# Patient Record
Sex: Female | Born: 1935 | Race: Black or African American | Hispanic: No | Smoking: Former smoker
Health system: Southern US, Community
[De-identification: ages and names within clinical notes are randomized; demographics above are authoritative.]

## PROBLEM LIST (undated history)

## (undated) DIAGNOSIS — F329 Major depressive disorder, single episode, unspecified: Secondary | ICD-10-CM

## (undated) DIAGNOSIS — J449 Chronic obstructive pulmonary disease, unspecified: Secondary | ICD-10-CM

## (undated) DIAGNOSIS — C189 Malignant neoplasm of colon, unspecified: Secondary | ICD-10-CM

## (undated) DIAGNOSIS — D126 Benign neoplasm of colon, unspecified: Secondary | ICD-10-CM

## (undated) DIAGNOSIS — H9193 Unspecified hearing loss, bilateral: Principal | ICD-10-CM

## (undated) DIAGNOSIS — Z8742 Personal history of other diseases of the female genital tract: Secondary | ICD-10-CM

## (undated) DIAGNOSIS — G609 Hereditary and idiopathic neuropathy, unspecified: Secondary | ICD-10-CM

## (undated) DIAGNOSIS — N23 Unspecified renal colic: Secondary | ICD-10-CM

## (undated) DIAGNOSIS — G8194 Hemiplegia, unspecified affecting left nondominant side: Secondary | ICD-10-CM

## (undated) DIAGNOSIS — F32A Depression, unspecified: Secondary | ICD-10-CM

## (undated) DIAGNOSIS — E119 Type 2 diabetes mellitus without complications: Secondary | ICD-10-CM

## (undated) DIAGNOSIS — Z860100 Personal history of colon polyps, unspecified: Secondary | ICD-10-CM

## (undated) DIAGNOSIS — M7918 Myalgia, other site: Secondary | ICD-10-CM

## (undated) DIAGNOSIS — D649 Anemia, unspecified: Secondary | ICD-10-CM

## (undated) DIAGNOSIS — D500 Iron deficiency anemia secondary to blood loss (chronic): Principal | ICD-10-CM

## (undated) DIAGNOSIS — K5790 Diverticulosis of intestine, part unspecified, without perforation or abscess without bleeding: Secondary | ICD-10-CM

## (undated) DIAGNOSIS — I10 Essential (primary) hypertension: Secondary | ICD-10-CM

## (undated) DIAGNOSIS — E7800 Pure hypercholesterolemia, unspecified: Secondary | ICD-10-CM

## (undated) DIAGNOSIS — C50919 Malignant neoplasm of unspecified site of unspecified female breast: Secondary | ICD-10-CM

## (undated) DIAGNOSIS — E039 Hypothyroidism, unspecified: Secondary | ICD-10-CM

## (undated) DIAGNOSIS — J4489 Other specified chronic obstructive pulmonary disease: Secondary | ICD-10-CM

## (undated) DIAGNOSIS — M6281 Muscle weakness (generalized): Secondary | ICD-10-CM

## (undated) HISTORY — DX: Major depressive disorder, single episode, unspecified: F32.9

## (undated) HISTORY — PX: MASTECTOMY: SHX3

## (undated) HISTORY — DX: Iron deficiency anemia secondary to blood loss (chronic): D50.0

## (undated) HISTORY — DX: Hemiplegia, unspecified affecting left nondominant side: G81.94

## (undated) HISTORY — DX: Type 2 diabetes mellitus without complications: E11.9

## (undated) HISTORY — DX: Myalgia, other site: M79.18

## (undated) HISTORY — DX: Diverticulosis of intestine, part unspecified, without perforation or abscess without bleeding: K57.90

## (undated) HISTORY — DX: Other specified chronic obstructive pulmonary disease: J44.89

## (undated) HISTORY — DX: Personal history of other diseases of the female genital tract: Z87.42

## (undated) HISTORY — DX: Depression, unspecified: F32.A

## (undated) HISTORY — DX: Hypothyroidism, unspecified: E03.9

## (undated) HISTORY — DX: Chronic obstructive pulmonary disease, unspecified: J44.9

## (undated) HISTORY — DX: Personal history of colon polyps, unspecified: Z86.0100

## (undated) HISTORY — DX: Muscle weakness (generalized): M62.81

## (undated) HISTORY — DX: Malignant neoplasm of unspecified site of unspecified female breast: C50.919

## (undated) HISTORY — DX: Anemia, unspecified: D64.9

## (undated) HISTORY — PX: TONSILLECTOMY: SUR1361

## (undated) HISTORY — DX: Personal history of colonic polyps: Z86.010

## (undated) HISTORY — DX: Unspecified renal colic: N23

## (undated) HISTORY — PX: PARTIAL COLECTOMY: SHX5273

## (undated) HISTORY — DX: Benign neoplasm of colon, unspecified: D12.6

## (undated) HISTORY — DX: Unspecified hearing loss, bilateral: H91.93

## (undated) HISTORY — PX: COLON SURGERY: SHX602

## (undated) HISTORY — PX: HERNIA REPAIR: SHX51

## (undated) HISTORY — DX: Hereditary and idiopathic neuropathy, unspecified: G60.9

---

## 1997-06-30 DIAGNOSIS — C189 Malignant neoplasm of colon, unspecified: Secondary | ICD-10-CM

## 1997-06-30 HISTORY — DX: Malignant neoplasm of colon, unspecified: C18.9

## 1999-07-01 DIAGNOSIS — C50919 Malignant neoplasm of unspecified site of unspecified female breast: Secondary | ICD-10-CM

## 1999-07-01 HISTORY — DX: Malignant neoplasm of unspecified site of unspecified female breast: C50.919

## 2005-05-02 ENCOUNTER — Ambulatory Visit (HOSPITAL_COMMUNITY): Admission: RE | Admit: 2005-05-02 | Discharge: 2005-05-02 | Payer: Self-pay | Admitting: Gastroenterology

## 2007-09-29 ENCOUNTER — Encounter: Payer: Self-pay | Admitting: Endocrinology

## 2007-11-17 ENCOUNTER — Encounter: Payer: Self-pay | Admitting: Endocrinology

## 2007-12-09 DIAGNOSIS — F329 Major depressive disorder, single episode, unspecified: Secondary | ICD-10-CM

## 2007-12-09 DIAGNOSIS — J069 Acute upper respiratory infection, unspecified: Secondary | ICD-10-CM | POA: Insufficient documentation

## 2007-12-09 DIAGNOSIS — M549 Dorsalgia, unspecified: Secondary | ICD-10-CM | POA: Insufficient documentation

## 2007-12-09 DIAGNOSIS — E119 Type 2 diabetes mellitus without complications: Secondary | ICD-10-CM

## 2007-12-09 DIAGNOSIS — Z85038 Personal history of other malignant neoplasm of large intestine: Secondary | ICD-10-CM

## 2007-12-09 DIAGNOSIS — E785 Hyperlipidemia, unspecified: Secondary | ICD-10-CM

## 2007-12-09 DIAGNOSIS — R002 Palpitations: Secondary | ICD-10-CM

## 2007-12-09 DIAGNOSIS — G609 Hereditary and idiopathic neuropathy, unspecified: Secondary | ICD-10-CM | POA: Insufficient documentation

## 2007-12-09 DIAGNOSIS — Z853 Personal history of malignant neoplasm of breast: Secondary | ICD-10-CM | POA: Insufficient documentation

## 2007-12-09 DIAGNOSIS — F17200 Nicotine dependence, unspecified, uncomplicated: Secondary | ICD-10-CM

## 2007-12-10 ENCOUNTER — Ambulatory Visit: Payer: Self-pay | Admitting: Endocrinology

## 2007-12-10 DIAGNOSIS — Z85038 Personal history of other malignant neoplasm of large intestine: Secondary | ICD-10-CM | POA: Insufficient documentation

## 2007-12-10 DIAGNOSIS — Z853 Personal history of malignant neoplasm of breast: Secondary | ICD-10-CM | POA: Insufficient documentation

## 2008-03-09 ENCOUNTER — Ambulatory Visit: Payer: Self-pay | Admitting: Endocrinology

## 2008-06-09 ENCOUNTER — Ambulatory Visit (HOSPITAL_COMMUNITY): Admission: RE | Admit: 2008-06-09 | Discharge: 2008-06-09 | Payer: Self-pay | Admitting: Gastroenterology

## 2008-06-09 ENCOUNTER — Encounter (INDEPENDENT_AMBULATORY_CARE_PROVIDER_SITE_OTHER): Payer: Self-pay | Admitting: Gastroenterology

## 2008-11-03 ENCOUNTER — Encounter: Admission: RE | Admit: 2008-11-03 | Discharge: 2008-11-03 | Payer: Self-pay | Admitting: Geriatric Medicine

## 2008-11-13 ENCOUNTER — Ambulatory Visit: Payer: Self-pay | Admitting: Endocrinology

## 2008-11-13 DIAGNOSIS — E890 Postprocedural hypothyroidism: Secondary | ICD-10-CM

## 2008-11-13 LAB — CONVERTED CEMR LAB: TSH: 13.24 microintl units/mL — ABNORMAL HIGH (ref 0.35–5.50)

## 2008-11-15 ENCOUNTER — Telehealth (INDEPENDENT_AMBULATORY_CARE_PROVIDER_SITE_OTHER): Payer: Self-pay | Admitting: *Deleted

## 2008-12-25 ENCOUNTER — Ambulatory Visit: Payer: Self-pay | Admitting: Endocrinology

## 2009-02-09 ENCOUNTER — Ambulatory Visit: Payer: Self-pay | Admitting: Vascular Surgery

## 2009-05-01 ENCOUNTER — Telehealth (INDEPENDENT_AMBULATORY_CARE_PROVIDER_SITE_OTHER): Payer: Self-pay | Admitting: *Deleted

## 2009-05-03 ENCOUNTER — Ambulatory Visit: Payer: Self-pay | Admitting: Endocrinology

## 2009-07-30 ENCOUNTER — Telehealth: Payer: Self-pay | Admitting: Endocrinology

## 2009-09-05 ENCOUNTER — Ambulatory Visit: Payer: Self-pay | Admitting: Endocrinology

## 2009-09-06 LAB — CONVERTED CEMR LAB: TSH: 1.92 microintl units/mL (ref 0.35–5.50)

## 2010-02-20 ENCOUNTER — Telehealth: Payer: Self-pay | Admitting: Endocrinology

## 2010-02-27 ENCOUNTER — Ambulatory Visit: Payer: Self-pay | Admitting: Endocrinology

## 2010-02-27 LAB — CONVERTED CEMR LAB: TSH: 3.56 microintl units/mL (ref 0.35–5.50)

## 2010-03-12 ENCOUNTER — Ambulatory Visit: Payer: Self-pay | Admitting: Gynecology

## 2010-03-13 ENCOUNTER — Ambulatory Visit: Payer: Self-pay | Admitting: Gynecology

## 2010-05-13 ENCOUNTER — Other Ambulatory Visit: Admission: RE | Admit: 2010-05-13 | Discharge: 2010-05-13 | Payer: Self-pay | Admitting: Gynecology

## 2010-05-13 ENCOUNTER — Ambulatory Visit: Payer: Self-pay | Admitting: Gynecology

## 2010-06-21 ENCOUNTER — Ambulatory Visit: Payer: Self-pay | Admitting: Gynecology

## 2010-07-30 NOTE — Progress Notes (Signed)
Summary: levothyroxine  Phone Note Refill Request Message from:  Fax from Pharmacy on February 20, 2010 10:16 AM  Refills Requested: Medication #1:  LEVOTHYROXINE SODIUM 50 MCG TABS 1 qd   Dosage confirmed as above?Dosage Confirmed   Last Refilled: 07/30/2009  Method Requested: Fax to Mail Away Pharmacy Initial call taken by: Brenton Grills MA,  February 20, 2010 10:16 AM    Prescriptions: LEVOTHYROXINE SODIUM 50 MCG TABS (LEVOTHYROXINE SODIUM) 1 qd  #90 x 1   Entered by:   Brenton Grills MA   Authorized by:   Minus Breeding MD   Signed by:   Brenton Grills MA on 02/20/2010   Method used:   Faxed to ...       [facility]* (mail-order)       [address], [address] [place]  [address]       Ph: [phone]       Fax: [phone]   RxID:   [id number]

## 2010-07-30 NOTE — Progress Notes (Signed)
  Phone Note Refill Request Message from:  Fax from Pharmacy on July 30, 2009 4:17 PM  Refills Requested: Medication #1:  LEVOTHYROXINE SODIUM 50 MCG TABS 1 qd.   Dosage confirmed as above?Dosage Confirmed Initial call taken by: Josph Macho CMA,  July 30, 2009 4:18 PM    Prescriptions: LEVOTHYROXINE SODIUM 50 MCG TABS (LEVOTHYROXINE SODIUM) 1 qd  #90 x 1   Entered by:   Josph Macho CMA   Authorized by:   Minus Breeding MD   Signed by:   Josph Macho CMA on 07/30/2009   Method used:   Electronically to        [facility]* (mail-order)       [address], [address] [place]  [address]       Ph: [phone]       Fax: [phone]   RxID:   [id number]

## 2010-07-30 NOTE — Assessment & Plan Note (Signed)
Summary: FU--STC   Vital Signs:  Patient profile:   76 year old female Height:      64 inches (162.56 cm) Weight:      99.50 pounds (45.23 kg) O2 Sat:      97 % on Room air Temp:     96.6 degrees F (35.89 degrees C) oral Pulse rate:   80 / minute BP sitting:   104 / 60  (left arm) Cuff size:   regular  Vitals Entered By: Josph Macho RMA (September 05, 2009 10:48 AM)  O2 Flow:  Room air CC: Follow-up visit/ CF Is Patient Diabetic? Yes   Referring Provider:  [name] Primary Provider:  [name]  CC:  Follow-up visit/ CF.  History of Present Illness: pt has h/o hypothyroidism since i-131 rx in approx. 1992.  she takes synthroid as rx'ed. pt says she feels well except for fatigue, weight loss, and diffuse hair loss.  Current Medications (verified): 1)  Alprazolam 0.5 Mg  Tbdp (Alprazolam) .... Take 1 By Mouth Three Times A Day 2)  Atrovent Hfa 17 Mcg/act  Aers (Ipratropium Bromide Hfa) .... Inhale 2 Puffs Qid 3)  Glucophage 500 Mg  Tabs (Metformin Hcl) .... Take 1 By Mouth Two Times A Day Qd 4)  Mucinex 600 Mg  Tb12 (Guaifenesin) .... Take 1 By Mouth Qd 5)  Nasal Saline 0.65 %  Soln (Saline) .Marland Kitchen.. 1 Spray Each Nostril Once Daily Prn 6)  Rhinocort Aqua 32 Mcg/act  Susp (Budesonide) .Marland Kitchen.. 1 Spray Each Nostril Two Times A Day 7)  Simvastatin 10 Mg  Tabs (Simvastatin) .... Take 1 By Mouth Qd 8)  Lidoderm 5 %  Ptch (Lidocaine) .... Use As Directed 9)  Lisinopril 10 Mg Tabs (Lisinopril) .Marland Kitchen.. 1 Daily 10)  Lyrica 50 Mg Caps (Pregabalin) .Marland Kitchen.. 1 Tab Three Times A Day 11)  Vitamin B 12 .Marland KitchenMarland Kitchen. 1 Daily 12)  Levothyroxine Sodium 50 Mcg Tabs (Levothyroxine Sodium) .Marland Kitchen.. 1 Qd 13)  Goody .... As Needed 14)  Bengay Pain Relief + Massage 2.5 % Gel (Menthol (Topical Analgesic)) .... As Needed  Allergies (verified): No Known Drug Allergies  Past History:  Past Medical History: Last updated: 12/10/2007 Depression Diabetes mellitus, type II Hyperlipidemia Hypothyroidism Breast cancer,  hx of Colon cancer, hx of  Review of Systems  The patient denies weight loss and weight gain.    Physical Exam  General:  underweight.   Neck:  thyroid is not enlarged. Additional Exam:  FastTSH                   1.92 uIU/mL     Impression & Recommendations:  Problem # 1:  HYPOTHYROIDISM, POST-RADIATION (ICD-244.1) well-replaced  Medications Added to Medication List This Visit: 1)  Goody  .... As needed 2)  Bengay Pain Relief + Massage 2.5 % Gel (Menthol (topical analgesic)) .... As needed  Other Orders: TLB-TSH (Thyroid Stimulating Hormone) (84443-TSH) Est. Patient Level III (50093)  Patient Instructions: 1)  we discussed the fact that you are replaced by less than a full average dosage of synthroid. therefore, you should conclude that your thyroid was not completely destroyed by the radioactive iodine, and that you are at risk for recurrence of the overactive thyroid. 2)  return 6 mos 3)  pending the test results, please continue the same synthroid (50/day) 4)  tests are being ordered for you today.  a few days after the test(s), please call [phone] to hear your test results.  5)  (update: i  left message on phone-tree:  rx as we discussed)

## 2010-07-30 NOTE — Assessment & Plan Note (Signed)
Summary: FU--STC   Vital Signs:  Patient profile:   76 year old female Height:      64 inches (162.56 cm) Weight:      101 pounds (45.91 kg) BMI:     17.40 O2 Sat:      98 % on Room air Temp:     97.7 degrees F (36.50 degrees C) oral Pulse rate:   59 / minute BP sitting:   118 / 68  (left arm) Cuff size:   regular  Vitals Entered By: Brenton Grills MA (February 27, 2010 10:09 AM)  O2 Flow:  Room air CC: F/U visit/pt is no longer taking Lyrica/aj Is Patient Diabetic? Yes   Referring Provider:  [name] Primary Provider:  [name]  CC:  F/U visit/pt is no longer taking Lyrica/aj.  History of Present Illness: pt has h/o hypothyroidism since i-131 rx in approx. 1992.  she takes synthroid as rx'ed. pt says she feels well except for fatigue and cold intolerance.    Current Medications (verified): 1)  Alprazolam 0.5 Mg  Tbdp (Alprazolam) .... Take 1 By Mouth Three Times A Day 2)  Atrovent Hfa 17 Mcg/act  Aers (Ipratropium Bromide Hfa) .... Inhale 2 Puffs Qid 3)  Glucophage 500 Mg  Tabs (Metformin Hcl) .... Take 1 By Mouth Two Times A Day Qd 4)  Mucinex 600 Mg  Tb12 (Guaifenesin) .... Take 1 By Mouth Qd 5)  Nasal Saline 0.65 %  Soln (Saline) .Marland Kitchen.. 1 Spray Each Nostril Once Daily Prn 6)  Rhinocort Aqua 32 Mcg/act  Susp (Budesonide) .Marland Kitchen.. 1 Spray Each Nostril Two Times A Day 7)  Simvastatin 10 Mg  Tabs (Simvastatin) .... Take 1 By Mouth Qd 8)  Lidoderm 5 %  Ptch (Lidocaine) .... Use As Directed 9)  Lisinopril 10 Mg Tabs (Lisinopril) .Marland Kitchen.. 1 Daily 10)  Lyrica 50 Mg Caps (Pregabalin) .Marland Kitchen.. 1 Tab Three Times A Day 11)  Vitamin B 12 .Marland KitchenMarland Kitchen. 1 Daily 12)  Levothyroxine Sodium 50 Mcg Tabs (Levothyroxine Sodium) .Marland Kitchen.. 1 Qd 13)  Goody .... As Needed 14)  Bengay Pain Relief + Massage 2.5 % Gel (Menthol (Topical Analgesic)) .... As Needed 15)  Gabapentin 100 Mg Caps (Gabapentin) .Marland Kitchen.. 1 Tablet By Mouth Three Times A Day  Allergies (verified): No Known Drug Allergies  Past History:  Past  Medical History: Last updated: 12/10/2007 Depression Diabetes mellitus, type II Hyperlipidemia Hypothyroidism Breast cancer, hx of Colon cancer, hx of  Review of Systems       she has lost a few lbs.  Physical Exam  General:  normal appearance.   Neck:  thyroid is not enlarged. Additional Exam:  FastTSH                   3.56 uIU/mL     Impression & Recommendations:  Problem # 1:  HYPOTHYROIDISM, POST-RADIATION (ICD-244.1) well-replaced  Medications Added to Medication List This Visit: 1)  Gabapentin 100 Mg Caps (Gabapentin) .Marland Kitchen.. 1 tablet by mouth three times a day  Other Orders: TLB-TSH (Thyroid Stimulating Hormone) (84443-TSH) Est. Patient Level III (47829)  Patient Instructions: 1)  blood tests are being ordered for you today.  please call [phone] to hear your test results. 2)  pending the test results, please continue the same medications for now 3)  Please schedule a follow-up appointment in 6 months. 4)  (update: i left message on phone-tree:  rx as we discussed)

## 2010-11-12 NOTE — Op Note (Signed)
NAME:  Laura [name], Laura [name]                 ACCOUNT NO.:  [id number]   MEDICAL RECORD NO.:  [id number]          PATIENT TYPE:  AMB   LOCATION:  ENDO                         FACILITY:  [facility]   PHYSICIAN:  James L. Malon Kindle., M.D.DATE OF BIRTH:  [birth year]   DATE OF PROCEDURE:  DATE OF DISCHARGE:                               OPERATIVE REPORT   SURGEON:  James L. Randa Evens, MD   PROCEDURES:  1. Colonoscopy.  2. Polypectomy.   MEDICATIONS:  1. Fentanyl 75 mcg.  2. Versed 7.5 mg IV.   INDICATION:  Followup for previous colon cancer done at another  location.   SCOPE:  Pentax pediatric colonoscope.   DESCRIPTION OF PROCEDURE:  The patient was sedated and the Pentax scope  was inserted and advanced.  Prep was quite good.  The scar was seen in  the sigmoid colon from previous surgery.  We passed this and were able  to advance easily to the cecum, ileocecal valve, and appendiceal orifice  seen.  Scope withdrawn.  Cecum, ascending colon, transverse colon, and  descending colon were seen well.  Approximately 25 cm from anal verge  just distal to the surgical anastomosis, a 0.5- to 0.75-cm pedunculated  polyp was encountered and removed with a snare and sucked through the  scope.  A second 0.5-cm sessile polyp was found in the mid rectum and  removed with a snare and sucked through the scope.  The scope was  withdrawn.  The patient tolerated the procedure well.  The polyps were  recovered.   ASSESSMENT:  1. Polyps removed from the rectosigmoid and rectum.  2. Previous history of colon cancer with previous resection of the      left colon.   PLAN:  Routine post-polypectomy instructions.  We will recommend  repeating procedure in 3 years.           ______________________________  Llana Aliment Malon Kindle., M.D.     Waldron Session  D:  06/09/2008  T:  06/10/2008  Job:  [id number]   cc:   Dr. [name]

## 2010-11-12 NOTE — Consult Note (Signed)
NEW PATIENT CONSULTATION   Laura [name], Laura [name]  DOB:  1935/03/08                                       02/09/2009  CHART#:[id number]   The patient presents today for evaluation of lower extremity pain.  She  reports that this has been present for a number of years and initially  was in her feet but now is from her knees distally.  She reports this is  present 24 hours a day 7 days a week and is becoming more incapacitating  to her.  This is not related to walking or other positioning.  She does  not have any history of tissue loss.   PAST MEDICAL HISTORY:  Is significant for non-insulin diabetes for  greater than 10 years.  She does have history of hypertension and  elevated cholesterol, history of breast cancer and colon cancer.   FAMILY HISTORY:  Negative for premature atherosclerotic disease.   SOCIAL HISTORY:  She is widowed with four children.  She is retired.  She does smoke half pack of cigarettes per day.  She does not drink  alcohol on a regular basis.   REVIEW OF SYSTEMS:  Positive for weight loss, loss of appetite, weight  at 105 pounds.  She is 5 feet tall.  Pulmonary does have history of  bronchitis.  Does have history of arthritic joint pain, depression and  nervousness.   MEDICATION ALLERGIES:  None.   CURRENT MEDICATIONS:  Lisinopril, levothyroxine, alprazolam, metformin,  simvastatin, Lyrica, Rhinocort, Atrovent.   PHYSICAL EXAMINATION:  A well-developed, well-nourished white female  appearing stated age of 48.  Blood pressure is 104/65, pulse 90,  respirations 18.  She is grossly intact neurologically.  Her carotid  arteries are without bruits bilaterally.  She has 2+ radial, 2+ femoral,  2+ popliteal and 2+ posterior tibial pulses bilaterally.  Her feet are  warm and well-perfused.  She has essentially normal ankle arm indices at  greater than 0.9 bilaterally with normal waveforms.   I discussed the significance of this with the patient and  her daughter  present.  I explained that she does not have any evidence of arterial  disease to explain her pain.  This may indeed be neurogenic and I feel  that neurologic consultation may be the next appropriate evaluation.  We  will see her again on an as-needed basis.   Larina Earthly, M.D.  Electronically Signed   TFE/MEDQ  D:  02/09/2009  T:  02/10/2009  Job:  [id number]   cc:   Dr [name]

## 2010-11-15 NOTE — Op Note (Signed)
NAME:  Laura [name], Laura [name]                 ACCOUNT NO.:  [id number]   MEDICAL RECORD NO.:  [id number]          PATIENT TYPE:  AMB   LOCATION:  ENDO                         [facility]ACILITY:  [facility]   PHYSICIAN:  James L. Malon Kindle., M.D.DATE OF BIRTH:  [birth year]   DATE OF PROCEDURE:  05/02/2005  DATE OF DISCHARGE:                                 OPERATIVE REPORT   PROCEDURE:  Colonoscopy.   MEDICATIONS:  Fentanyl 75 mcg, Versed 9 mg IV.   INDICATIONS FOR PROCEDURE:  A patient with a previous history of colon  cancer in another location in 1999. This is done as a followup procedure.  She has a family history of colon cancer in her mother as well. The patient  has also had a colon resection for transverse colon cancer, tubal ligation,  and bilateral mastectomy.   DESCRIPTION OF PROCEDURE:  The procedure had been explained to the patient  and consent obtained. With the patient in the left lateral decubitus  position, a digital exam was performed and Olympus pediatric adjustable  scope was inserted and advanced. The prep excellent. We were able to reach  the anastomosis. The ileocecal valve and appendiceal orifice seen. The scope  withdrawn. No polyp seen throughout the remainder of the colon. It was felt  this was in the transverse colon and a good part of her transverse colon had  been removed. The left colon was normal with no polyps, scattered  diverticula. The rectum was free of lesions and on retroflexed view was seen  to have internal hemorrhoids.   ASSESSMENT:  History of colon cancer with negative colon, V10.05.   PLAN:  Will recommend yearly Hemoccult's and repeat colonoscopy in 3 years.           ______________________________  Llana Aliment Malon Kindle., M.D.     Waldron Session  D:  05/02/2005  T:  05/02/2005  Job:  [id number]   cc:   [name], MD

## 2011-04-04 LAB — GLUCOSE, CAPILLARY
Glucose-Capillary: 189 mg/dL — ABNORMAL HIGH (ref 70–99)
Glucose-Capillary: 79 mg/dL (ref 70–99)

## 2011-05-08 ENCOUNTER — Emergency Department (HOSPITAL_COMMUNITY)
Admission: EM | Admit: 2011-05-08 | Discharge: 2011-05-08 | Disposition: A | Discharge status: Home / Self Care | Payer: Medicare Other | Attending: Emergency Medicine | Admitting: Emergency Medicine

## 2011-05-08 ENCOUNTER — Encounter: Payer: Self-pay | Admitting: *Deleted

## 2011-05-08 ENCOUNTER — Emergency Department (HOSPITAL_COMMUNITY): Payer: Medicare Other

## 2011-05-08 DIAGNOSIS — M542 Cervicalgia: Secondary | ICD-10-CM | POA: Insufficient documentation

## 2011-05-08 DIAGNOSIS — E7800 Pure hypercholesterolemia, unspecified: Secondary | ICD-10-CM | POA: Insufficient documentation

## 2011-05-08 DIAGNOSIS — M62838 Other muscle spasm: Secondary | ICD-10-CM | POA: Insufficient documentation

## 2011-05-08 DIAGNOSIS — F17200 Nicotine dependence, unspecified, uncomplicated: Secondary | ICD-10-CM | POA: Insufficient documentation

## 2011-05-08 DIAGNOSIS — E119 Type 2 diabetes mellitus without complications: Secondary | ICD-10-CM | POA: Insufficient documentation

## 2011-05-08 DIAGNOSIS — I10 Essential (primary) hypertension: Secondary | ICD-10-CM | POA: Insufficient documentation

## 2011-05-08 DIAGNOSIS — R51 Headache: Secondary | ICD-10-CM | POA: Insufficient documentation

## 2011-05-08 DIAGNOSIS — Z85038 Personal history of other malignant neoplasm of large intestine: Secondary | ICD-10-CM | POA: Insufficient documentation

## 2011-05-08 DIAGNOSIS — Z853 Personal history of malignant neoplasm of breast: Secondary | ICD-10-CM | POA: Insufficient documentation

## 2011-05-08 HISTORY — DX: Pure hypercholesterolemia, unspecified: E78.00

## 2011-05-08 HISTORY — DX: Essential (primary) hypertension: I10

## 2011-05-08 HISTORY — DX: Malignant neoplasm of colon, unspecified: C18.9

## 2011-05-08 LAB — CBC
MCV: 91.5 fL (ref 78.0–100.0)
Platelets: 304 10*3/uL (ref 150–400)
RBC: 4.45 MIL/uL (ref 3.87–5.11)
RDW: 20.8 % — ABNORMAL HIGH (ref 11.5–15.5)
WBC: 6.7 10*3/uL (ref 4.0–10.5)

## 2011-05-08 LAB — DIFFERENTIAL
Basophils Absolute: 0 10*3/uL (ref 0.0–0.1)
Eosinophils Relative: 1 % (ref 0–5)
Lymphocytes Relative: 23 % (ref 12–46)
Lymphs Abs: 1.5 10*3/uL (ref 0.7–4.0)
Neutro Abs: 4.7 10*3/uL (ref 1.7–7.7)
Neutrophils Relative %: 70 % (ref 43–77)

## 2011-05-08 LAB — POCT I-STAT, CHEM 8
BUN: 12 mg/dL (ref 6–23)
Calcium, Ion: 1.22 mmol/L (ref 1.12–1.32)
Chloride: 104 mEq/L (ref 96–112)
HCT: 44 % (ref 36.0–46.0)
Potassium: 4.6 mEq/L (ref 3.5–5.1)
Sodium: 141 mEq/L (ref 135–145)

## 2011-05-08 LAB — SEDIMENTATION RATE: Sed Rate: 23 mm/hr — ABNORMAL HIGH (ref 0–22)

## 2011-05-08 MED ORDER — DIAZEPAM 5 MG PO TABS
5.0000 mg | ORAL_TABLET | Freq: Once | ORAL | Status: AC
  Administered 2011-05-08: 5 mg via ORAL
  Filled 2011-05-08: qty 1

## 2011-05-08 MED ORDER — FENTANYL CITRATE 0.05 MG/ML IJ SOLN
50.0000 ug | Freq: Once | INTRAMUSCULAR | Status: AC
  Administered 2011-05-08: 50 ug via INTRAVENOUS
  Filled 2011-05-08: qty 2

## 2011-05-08 MED ORDER — DIPHENHYDRAMINE HCL 50 MG/ML IJ SOLN
12.5000 mg | Freq: Once | INTRAMUSCULAR | Status: AC
  Administered 2011-05-08: 12.5 mg via INTRAVENOUS
  Filled 2011-05-08: qty 1

## 2011-05-08 MED ORDER — DIAZEPAM 5 MG PO TABS: ORAL_TABLET | ORAL | Status: DC

## 2011-05-08 MED ORDER — FENTANYL CITRATE 0.05 MG/ML IJ SOLN
50.0000 ug | Freq: Once | INTRAMUSCULAR | Status: DC
  Filled 2011-05-08: qty 2

## 2011-05-08 MED ORDER — DEXAMETHASONE SODIUM PHOSPHATE 10 MG/ML IJ SOLN
10.0000 mg | Freq: Once | INTRAMUSCULAR | Status: AC
  Administered 2011-05-08: 10 mg via INTRAVENOUS
  Filled 2011-05-08: qty 1

## 2011-05-08 NOTE — ED Provider Notes (Signed)
  I performed a history and physical examination of Laura [name] and discussed her management with Drucie Opitz.  I agree with the history, physical, assessment, and plan of care, with the following exceptions: None The patient is a 76 year old female with a chief complaint of headache and neck pain, with a headache that has been present for approximately 3 weeks, and nonfocal neurological assessment, no fever or chills, no nausea or vomiting, and with significant muscle spasticity at the cervical spinal musculature. She has no meningeal signs, but does have some limited range of motion for muscle spasm. Palpation of her paraspinal soft tissues reveals significant muscular spasm here. I agree with a CT scan of her brain to evaluate for any intracranial malignancy behind headache, but otherwise I do not suspect meningitis or subarachnoid hemorrhage in this patient based on her history and physical examination. I was present for the following procedures: None Time Spent in Critical Care of the patient: None Time spent in discussions with the patient and family: 5 minutes  Mervin Ramires D    Felisa Bonier, MD 05/08/11 1652

## 2011-05-08 NOTE — ED Provider Notes (Signed)
History     CSN: [id number] Arrival date & time: 05/08/2011  1:16 PM   None     Chief Complaint  Patient presents with  . Neck Pain    (Consider location/radiation/quality/duration/timing/severity/associated sxs/prior treatment) HPI  Patient presents to emergency department with her daughter at bedside complaining of a 3 week history of gradual onset headache and neck pain. Patient states the headache is more right sided and has been persistent for 3 weeks despite taking at-home Advil and additional pain medicine given to her by an urgent care. Patient states she may have temporary relief of pain for an hour or two before return of headache and this has been constant for 3 weeks. Patient states headache is aggravated by movement and therefore "keeps head very still and stiff " to decrease the pain. Patient states mild associated dizziness but denies fevers, chills, vision changes, rash, disorientation, difficulty ambulating, sore throat, nasal congestion, cough. Patient states her primary care doctor, Dr. Redmond School, who visits her at home saw her today and recommended she go to the ER to have a CT scan performed. The patient history of both colon and breast cancer but denies any current cancer treatment and states she is in remission. Patient denies history metastasis from either cancer.   Past Medical History  Diagnosis Date  . Diabetes mellitus   . Hypercholesteremia   . Hypertension   . Cancer     double mastectomy  . Cancer of colon     Past Surgical History  Procedure Date  . Partial colectomy   . Mastectomy   . Tonsillectomy     History reviewed. No pertinent family history.  History  Substance Use Topics  . Smoking status: Current Everyday Smoker -- 0.5 packs/day  . Smokeless tobacco: Not on file  . Alcohol Use: No    OB History    Grav Para Term Preterm Abortions TAB SAB Ect Mult Living                  Review of Systems  All other systems reviewed and are  negative.    Allergies  Review of patient's allergies indicates no known allergies.  Home Medications   Current Outpatient Rx  Name Route Sig Dispense Refill  . ALPRAZOLAM 0.5 MG PO TABS Oral Take 0.5 mg by mouth 3 (three) times daily.      Marland Kitchen VITAMIN D 1000 UNITS PO TABS Oral Take 1,000 Units by mouth daily.      Marland Kitchen DICLOFENAC SODIUM 75 MG PO TBEC Oral Take 75 mg by mouth 2 (two) times daily.      Marland Kitchen FERROUS SULFATE 325 (65 FE) MG PO TABS Oral Take 325 mg by mouth daily with breakfast.      . LEVOTHYROXINE SODIUM 50 MCG PO TABS Oral Take 50 mcg by mouth daily.      Marland Kitchen LISINOPRIL 10 MG PO TABS Oral Take 10 mg by mouth daily.      Marland Kitchen METFORMIN HCL ER (OSM) 1000 MG PO TB24 Oral Take 1,000 mg by mouth daily with breakfast.      . METFORMIN HCL 1000 MG PO TABS Oral Take 1,000 mg by mouth 2 (two) times daily with a meal.      . SIMVASTATIN 10 MG PO TABS Oral Take 10 mg by mouth at bedtime.      . TRIAMCINOLONE ACETONIDE EX AERS Topical Apply topically 2 (two) times daily.        BP 132/55  Pulse 110  Temp(Src) 98.6 F (37 C) (Oral)  Resp 18  Wt 101 lb (45.813 kg)  SpO2 100%  Physical Exam  Nursing note and vitals reviewed. Constitutional: She is oriented to person, place, and time. She appears well-developed and well-nourished. No distress.  HENT:  Head: Normocephalic and atraumatic.  Eyes: Conjunctivae and EOM are normal. Pupils are equal, round, and reactive to light. Right eye exhibits no nystagmus. Left eye exhibits no nystagmus.  Neck: Normal range of motion. Neck supple. Muscular tenderness present. No spinous process tenderness present. No Brudzinski's sign and no Kernig's sign noted.       No meningeal signs but tenderness to palpation of bilateral lower lateral soft tissue neck with moderate muscle spasm and tension of bilateral neck into the upper posterior shoulders  Cardiovascular: Normal rate, regular rhythm, normal heart sounds and intact distal pulses.  Exam reveals no  gallop and no friction rub.   No murmur heard. Pulmonary/Chest: Effort normal and breath sounds normal. No respiratory distress. She has no wheezes. She has no rales. She exhibits no tenderness.  Abdominal: Bowel sounds are normal. She exhibits no distension and no mass. There is no tenderness. There is no rebound and no guarding.  Musculoskeletal: Normal range of motion. She exhibits no edema and no tenderness.  Neurological: She is alert and oriented to person, place, and time. She has normal strength. No cranial nerve deficit or sensory deficit. Coordination normal.  Skin: Skin is warm and dry. No rash noted. She is not diaphoretic. No erythema.  Psychiatric: She has a normal mood and affect.    ED Course  Procedures (including critical care time)  Patient states that after IV pain meds and by mouth Valium pain has resolved. She continues to have no neuro focal findings, remaining afebrile and in no acute distress. No acute findings on CT scan.  Labs Reviewed  CBC - Abnormal; Notable for the following:    RDW 20.8 (*)    All other components within normal limits  SEDIMENTATION RATE - Abnormal; Notable for the following:    Sed Rate 23 (*)    All other components within normal limits  POCT I-STAT, CHEM 8 - Abnormal; Notable for the following:    Glucose, Bld 128 (*)    All other components within normal limits  DIFFERENTIAL  I-STAT, CHEM 8   Ct Head Wo Contrast  05/08/2011  *RADIOLOGY REPORT*  Clinical Data: Right sided headache, neck pain/stiffness  CT HEAD WITHOUT CONTRAST  Technique:  Contiguous axial images were obtained from the base of the skull through the vertex without contrast.  Comparison: None.  Findings: No evidence of parenchymal hemorrhage or extra-axial fluid collection. No mass lesion, mass effect, or midline shift.  No CT evidence of acute infarction.  Mild periventricular small vessel ischemic changes.  Mild atrophy with mild prominence of the frontal extra-axial  spaces.  The visualized paranasal sinuses are essentially clear.  Suspected prior sinus surgery.  The mastoid air cells are unopacified.  No evidence of calvarial fracture.  IMPRESSION: No evidence of acute intracranial abnormality.  Mild cortical atrophy.  Original Report Authenticated By: Charline Bills, M.D.     1. Headache   2. Muscle spasm       MDM  patient states that after IV pain meds and by mouth Valium pain has resolved. She continues to have no neuro focal findings, remaining afebrile and in no acute distress. No acute findings on CT scan.        [employer]  Syliva Overman, PA 05/08/11 [phone]

## 2011-05-08 NOTE — ED Notes (Signed)
Pt states "have been having right side neck pain with stiffness and right side h/a x 2 wks, have home visits & they suggested she come here for CT scan"

## 2011-05-09 NOTE — ED Provider Notes (Signed)
Evaluation and management procedures were performed by the PA/NP under my supervision/collaboration.    Laura [name] D Cierah Crader, MD 05/09/11 1432 

## 2011-06-11 ENCOUNTER — Other Ambulatory Visit: Payer: Self-pay | Admitting: Neurology

## 2011-06-11 DIAGNOSIS — R269 Unspecified abnormalities of gait and mobility: Secondary | ICD-10-CM

## 2011-06-11 DIAGNOSIS — M542 Cervicalgia: Secondary | ICD-10-CM

## 2011-06-14 ENCOUNTER — Ambulatory Visit
Admission: RE | Admit: 2011-06-14 | Discharge: 2011-06-14 | Disposition: A | Discharge status: Home / Self Care | Payer: Medicare Other | Source: Ambulatory Visit | Attending: Neurology | Admitting: Neurology

## 2011-06-14 DIAGNOSIS — M542 Cervicalgia: Secondary | ICD-10-CM

## 2011-06-14 DIAGNOSIS — R269 Unspecified abnormalities of gait and mobility: Secondary | ICD-10-CM

## 2012-09-15 ENCOUNTER — Encounter: Payer: Self-pay | Admitting: Internal Medicine

## 2012-09-30 ENCOUNTER — Encounter: Payer: Self-pay | Admitting: Internal Medicine

## 2012-10-05 ENCOUNTER — Ambulatory Visit (INDEPENDENT_AMBULATORY_CARE_PROVIDER_SITE_OTHER): Payer: Medicare Other | Admitting: Internal Medicine

## 2012-10-05 ENCOUNTER — Encounter: Payer: Self-pay | Admitting: Internal Medicine

## 2012-10-05 ENCOUNTER — Ambulatory Visit: Payer: Medicare Other

## 2012-10-05 VITALS — BP 126/70 | HR 101 | Ht 64.0 in | Wt 111.0 lb

## 2012-10-05 DIAGNOSIS — D509 Iron deficiency anemia, unspecified: Secondary | ICD-10-CM

## 2012-10-05 DIAGNOSIS — Z85038 Personal history of other malignant neoplasm of large intestine: Secondary | ICD-10-CM

## 2012-10-05 DIAGNOSIS — R195 Other fecal abnormalities: Secondary | ICD-10-CM

## 2012-10-05 LAB — FERRITIN: Ferritin: 2.5 ng/mL — ABNORMAL LOW (ref 10.0–291.0)

## 2012-10-05 LAB — IBC PANEL
Iron: 17 ug/dL — ABNORMAL LOW (ref 42–145)
Transferrin: 438 mg/dL — ABNORMAL HIGH (ref 212.0–360.0)

## 2012-10-05 LAB — CBC WITH DIFFERENTIAL/PLATELET
Hemoglobin: 6.4 g/dL — CL (ref 12.0–15.0)
MCHC: 29.9 g/dL — ABNORMAL LOW (ref 30.0–36.0)
RDW: 17.2 % — ABNORMAL HIGH (ref 11.5–14.6)

## 2012-10-05 MED ORDER — PEG-KCL-NACL-NASULF-NA ASC-C 100 G PO SOLR: 1.0000 | Freq: Once | ORAL | Status: DC

## 2012-10-05 NOTE — Progress Notes (Signed)
Patient ID: Laura [name], female   DOB: 1935/04/29, 78 y.o.   MRN: [id number]  HPI: [name] is a 78 yo female with PMH of colon cancer diagnosed and treated in the 1999 at [facility] with what sounds like sigmoidectomy (no report of chemotherapy or radiation), breast cancer currently in remission (dx 2000), hypertension, diabetes, hypothyroidism, who seen in consultation the request of Dr. Redmond School for evaluation of anemia and heme positive stool.  The patient reports overall she feels well today. She does have some fatigue, but no pain. Specifically she denies abdominal pain today. She reports no nausea or vomiting. When asked about her appetite she reports she is a "poor eater", and describes herself as "picky eater", none of these are new.  She denies early satiety. No trouble swallowing. She reports her bowel habits are seldom normal. She reports constipation alternating with diarrhea. This is also not new. She occasionally uses stool softeners, Dulcolax and prune juice. Reports her last colonoscopy was with Dr. Randa Evens in 2011 and she reports this was normal. She denies seeing blood in her stool and denies melena. She reports approximately a year ago she had vaginal bleeding and this was evaluated with endometrial biopsy which was negative. She's had no further vaginal bleeding. She has been on iron for about 8 weeks. He is also drinking a liquid protein shake to help her nutrition. This is called pro-stat.  No fevers or chills.  Past Medical History  Diagnosis Date  . Diabetes mellitus   . Hypercholesteremia   . Hypertension   . Cancer     double mastectomy  . Cancer of colon   . Chronic obstructive bronchitis   . Anemia   . Hypothyroidism   . Idiopathic peripheral neuropathy   . Muscle weakness (generalized)   . Myofacial muscle pain   . Depression   . Renal colic   . Hx of vaginal bleeding   . Hx of colonic polyps     Past Surgical History  Procedure Laterality Date  . Partial colectomy     . Mastectomy      double  . Tonsillectomy    . Hernia repair    . Colon surgery      Current Outpatient Prescriptions  Medication Sig Dispense Refill  . ALPRAZolam (XANAX) 0.5 MG tablet Take 0.5 mg by mouth 3 (three) times daily.        . B Complex Vitamins (VITAMIN B COMPLEX PO) Take 1 tablet by mouth daily.      . cholecalciferol (VITAMIN D) 1000 UNITS tablet Take 1,000 Units by mouth daily.        . feeding supplement (PRO-STAT SUGAR FREE 64) LIQD Take 30 mLs by mouth daily. Take 1 tablespoon daily      . ferrous sulfate 325 (65 FE) MG tablet Take 325 mg by mouth daily with breakfast.        . Fluticasone-Salmeterol (ADVAIR) 100-50 MCG/DOSE AEPB Inhale 1 puff into the lungs every 12 (twelve) hours.        Marland Kitchen guaiFENesin (MUCINEX) 600 MG 12 hr tablet Take 1,200 mg by mouth 2 (two) times daily.      Marland Kitchen levalbuterol (XOPENEX) 1.25 MG/0.5ML nebulizer solution Take 1 ampule by nebulization every 4 (four) hours as needed for wheezing.      Marland Kitchen levothyroxine (SYNTHROID, LEVOTHROID) 50 MCG tablet Take 50 mcg by mouth daily.        . metFORMIN (GLUCOPHAGE) 1000 MG tablet Take 1,000 mg by mouth 2 (  two) times daily with a meal.        . mirtazapine (REMERON) 15 MG tablet Take 15 mg by mouth at bedtime.      . simvastatin (ZOCOR) 10 MG tablet Take 10 mg by mouth at bedtime.        . triamcinolone (KENALOG) topical spray Apply topically 2 (two) times daily.       . vitamin B-12 (CYANOCOBALAMIN) 500 MCG tablet Take 500 mcg by mouth daily.      . peg 3350 powder (MOVIPREP) 100 G SOLR Take 1 kit (100 g total) by mouth once.  1 kit  0   No current facility-administered medications for this visit.    Allergies  Allergen Reactions  . Diclofenac Nausea And Vomiting    Family History  Problem Relation Age of Onset  . Breast cancer Maternal Aunt   . Breast cancer Daughter   . Lung cancer Brother     X2 died from  . Lung cancer Sister     died from    History   Social History  . Marital  Status: Widowed    Spouse Name: N/A    Number of Children: N/A  . Years of Education: N/A   Social History Main Topics  . Smoking status: Current Every Day Smoker -- 0.50 packs/day    Types: Cigarettes  . Smokeless tobacco: None  . Alcohol Use: No  . Drug Use: No  . Sexually Active: None    ROS: As per history of present illness, otherwise negative  BP 126/70  Pulse 101  Ht 5\' 4"  (1.626 m)  Wt 111 lb (50.349 kg)  BMI 19.04 kg/m2  SpO2 97% Constitutional: Well-developed and well-nourished. No distress. HEENT: Normocephalic and atraumatic. Conjunctivae are pale.  No scleral icterus. Neck: Neck supple. Trachea midline. Cardiovascular: Normal rate, regular rhythm and intact distal pulses.  Pulmonary/chest: Effort normal and breath sounds normal. No wheezing, rales or rhonchi. Abdominal: Soft, nontender, nondistended. Bowel sounds active throughout.  Extremities: no clubbing, cyanosis, or edema Neurological: Alert and oriented to person place and time. Skin: Skin is warm and dry. No rashes noted. Psychiatric: Normal mood and affect. Behavior is normal.  RELEVANT LABS AND IMAGING: CBC    Component Value Date/Time   WBC 6.7 05/08/2011 1625   RBC 4.45 05/08/2011 1625   HGB 15.0 05/08/2011 1644   HCT 44.0 05/08/2011 1644   PLT 304 05/08/2011 1625   MCV 91.5 05/08/2011 1625   MCH 29.0 05/08/2011 1625   MCHC 31.7 05/08/2011 1625   RDW 20.8* 05/08/2011 1625   LYMPHSABS 1.5 05/08/2011 1625   MONOABS 0.4 05/08/2011 1625   EOSABS 0.1 05/08/2011 1625   BASOSABS 0.0 05/08/2011 1625   Labs data 07/01/2012 Comprehensive metabolic panel within normal limits except for bilirubin low at 0.2 WBC 4.4, hemoglobin 6.7, hematocrit 21.7, MCV 85.8, platelet count 395 TSH 3.7 B12 327 Folate 18.6 Hemoglobin A1c 6.7 Vitamin D 25  Lab dated 09/13/2012 FOBT POSITIVE  ASSESSMENT/PLAN:  78 yo female with PMH of colon cancer diagnosed and treated in the 1999 at [facility] with what sounds like sigmoidectomy  (no report of chemotherapy or radiation), breast cancer currently in remission (dx 2000), hypertension, diabetes, hypothyroidism, who seen in consultation the request of Dr. Redmond School for evaluation of anemia and heme positive stool.   1.  Significant normocytic anemia with heme-positive stools -- her hemoglobin was very low at 6.7 in January. Unfortunately I do not have an interval measurement.  I am checking  CBC and iron studies today. Given her history of colorectal cancer and heme-positive stools, I recommended repeat colonoscopy. We discussed the test today including the risks and benefits and she is agreeable to proceed. We also will perform an upper endoscopy for anemia and heme-positive stool at the same time to exclude an upper GI source for blood loss. We did briefly discuss that both tests are unrevealing as to a source of her heme-positive stool, we may proceed with video capsule endoscopy. Her now she will continue iron supplementation therapy on a daily basis. The risks and benefits were discussed and she is agreeable to proceed. Further recommendations after procedures.  I have requested records both from [facility] and Dr. Randa Evens as to her previous endoscopic procedures

## 2012-10-05 NOTE — Patient Instructions (Addendum)
You have been given a separate informational sheet regarding your tobacco use, the importance of quitting and local resources to help you quit.  You have been scheduled for a colonoscopy/Endoscopy with propofol. Please follow written instructions given to you at your visit today.  Please pick up your prep kit at the pharmacy within the next 1-3 days. If you use inhalers (even only as needed), please bring them with you on the day of your procedure.  Your physician has requested that you go to the basement for the following lab work before leaving today: Iron studies  We have sent the following medications to your pharmacy for you to pick up at your convenience: Moviprep                                               We are excited to introduce MyChart, a new best-in-class service that provides you online access to important information in your electronic medical record. We want to make it easier for you to view your health information - all in one secure location - when and where you need it. We expect MyChart will enhance the quality of care and service we provide.  When you register for MyChart, you can:    View your test results.    Request appointments and receive appointment reminders via email.    Request medication renewals.    View your medical history, allergies, medications and immunizations.    Communicate with your physician's office through a password-protected site.    Conveniently print information such as your medication lists.  To find out if MyChart is right for you, please talk to a member of our clinical staff today. We will gladly answer your questions about this free health and wellness tool.  If you are age 76 or older and want a member of your family to have access to your record, you must provide written consent by completing a proxy form available at our office. Please speak to our clinical staff about guidelines regarding accounts for patients younger than age  58.  As you activate your MyChart account and need any technical assistance, please call the MyChart technical support line at [phone]-[phone]) or email your question to [email or web address]. If you email your question(s), please include your name, a return phone number and the best time to reach you.  If you have non-urgent health-related questions, you can send a message to our office through MyChart at [email or web address]. If you have a medical emergency, call 911.  Thank you for using MyChart as your new health and wellness resource!   MyChart licensed from [employer],  [id number]. Patents Pending.

## 2012-10-06 ENCOUNTER — Encounter: Payer: Self-pay | Admitting: Internal Medicine

## 2012-10-06 LAB — PATHOLOGIST SMEAR REVIEW

## 2012-10-07 ENCOUNTER — Other Ambulatory Visit: Payer: Self-pay | Admitting: *Deleted

## 2012-10-07 ENCOUNTER — Telehealth: Payer: Self-pay | Admitting: Internal Medicine

## 2012-10-07 DIAGNOSIS — D509 Iron deficiency anemia, unspecified: Secondary | ICD-10-CM

## 2012-10-07 NOTE — Telephone Encounter (Signed)
Mailed patient a copy of labs and recommendations.

## 2012-10-21 ENCOUNTER — Encounter: Payer: Self-pay | Admitting: Internal Medicine

## 2012-10-21 ENCOUNTER — Other Ambulatory Visit: Payer: Self-pay | Admitting: Internal Medicine

## 2012-10-21 ENCOUNTER — Ambulatory Visit (AMBULATORY_SURGERY_CENTER): Payer: Medicare Other | Admitting: Internal Medicine

## 2012-10-21 VITALS — BP 129/52 | HR 72 | Temp 99.3°F | Resp 23 | Ht 64.0 in | Wt 111.0 lb

## 2012-10-21 DIAGNOSIS — D649 Anemia, unspecified: Secondary | ICD-10-CM

## 2012-10-21 DIAGNOSIS — D126 Benign neoplasm of colon, unspecified: Secondary | ICD-10-CM

## 2012-10-21 DIAGNOSIS — D509 Iron deficiency anemia, unspecified: Secondary | ICD-10-CM

## 2012-10-21 DIAGNOSIS — R195 Other fecal abnormalities: Secondary | ICD-10-CM

## 2012-10-21 DIAGNOSIS — Z85038 Personal history of other malignant neoplasm of large intestine: Secondary | ICD-10-CM

## 2012-10-21 LAB — GLUCOSE, CAPILLARY: Glucose-Capillary: 85 mg/dL (ref 70–99)

## 2012-10-21 MED ORDER — DEXTROSE 5 % IV SOLN: INTRAVENOUS | Status: DC

## 2012-10-21 MED ORDER — OMEPRAZOLE 40 MG PO CPDR: 40.0000 mg | DELAYED_RELEASE_CAPSULE | Freq: Every day | ORAL | Status: DC

## 2012-10-21 MED ORDER — SODIUM CHLORIDE 0.9 % IV SOLN: 500.0000 mL | INTRAVENOUS | Status: DC

## 2012-10-21 NOTE — Op Note (Signed)
[facility] [address] [place], [address]   ENDOSCOPY PROCEDURE REPORT  PATIENT: Laura [name], Laura [name].  MR#: [id number] BIRTHDATE: 1935/04/06 , 77  yrs. old GENDER: Female ENDOSCOPIST: Beverley Fiedler, MD REFERRED BY:  [name] PROCEDURE DATE:  10/21/2012 PROCEDURE:  EGD, diagnostic ASA CLASS:     Class III INDICATIONS:  Heme positive stool.   Anemia. MEDICATIONS: MAC sedation, administered by CRNA and Propofol (Diprivan) 170 mg IV TOPICAL ANESTHETIC: Cetacaine Spray  DESCRIPTION OF PROCEDURE: After the risks benefits and alternatives of the procedure were thoroughly explained, informed consent was obtained.  The LB GIF-H180 [id number] endoscope was introduced through the mouth and advanced to the second portion of the duodenum. Without limitations.  The instrument was slowly withdrawn as the mucosa was fully examined.     ESOPHAGUS: The mucosa of the esophagus appeared normal.  STOMACH: The mucosa of the stomach appeared normal.  DUODENUM: Three small and nonbleeding angioectasias were found in the 2nd part of the duodenum.   The duodenal mucosa showed no other abnormalities in the bulb and second portion of the duodenum. Retroflexed views revealed no abnormalities.     The scope was then withdrawn from the patient and the procedure completed.  COMPLICATIONS: There were no complications. ENDOSCOPIC IMPRESSION: 1.   The mucosa of the esophagus appeared normal 2.   The mucosa of the stomach appeared normal 3.   Angioectasias were found in the 2nd part of the duodenum 4.   The duodenal mucosa showed no other abnormalities in the bulb and second portion of the duodenum  RECOMMENDATIONS: 1.  Daily PPI for acid suppression 2.  Hematology referral for consideration of IV iron 3.  Arrange small bowel enteroscopy with planned APC of duodenal angioectasias 4.  Monitor hemoglobin, hematocrit and iron studies  eSigned:  Beverley Fiedler, MD 10/21/2012 3:45 PM              CC:The Patient

## 2012-10-21 NOTE — Progress Notes (Signed)
Called to room to assist during endoscopic procedure.  Patient ID and intended procedure confirmed with present staff. Received instructions for my participation in the procedure from the performing physician.  

## 2012-10-21 NOTE — Patient Instructions (Addendum)

## 2012-10-21 NOTE — Progress Notes (Signed)
Patient did not experience any of the following events: a burn prior to discharge; a fall within the facility; wrong site/side/patient/procedure/implant event; or a hospital transfer or hospital admission upon discharge from the facility. (G8907) Patient did not have preoperative order for IV antibiotic SSI prophylaxis. (G8918)  

## 2012-10-21 NOTE — Op Note (Signed)
[facility] [address] [place], [address]   COLONOSCOPY PROCEDURE REPORT  PATIENT: Laura, [name].  MR#: [id number] BIRTHDATE: 02-Jul-1934 , 77  yrs. old GENDER: Female ENDOSCOPIST: Beverley Fiedler, MD REFERRED XB:JYNWG Tripp, M.D. PROCEDURE DATE:  10/21/2012 PROCEDURE:   Colonoscopy with snare polypectomy and Colonoscopy with cold biopsy polypectomy ASA CLASS:   Class III INDICATIONS:elevated risk screening, High risk patient with personal history of colon cancer, heme-positive stool, Anemia, non-specific, and Last colonoscopy performed 2011. MEDICATIONS: MAC sedation, administered by CRNA and Propofol (Diprivan) 160  DESCRIPTION OF PROCEDURE:   After the risks benefits and alternatives of the procedure were thoroughly explained, informed consent was obtained.  A digital rectal exam revealed no rectal mass.   The LB PCF-H180AL [id number]  endoscope was introduced through the anus and advanced to the cecum, which was identified by both the appendix and ileocecal valve. No adverse events experienced. The quality of the prep was Moviprep fair  The instrument was then slowly withdrawn as the colon was fully examined.   COLON FINDINGS: Two sessile polyps measuring 2-3 mm in size were found in the ascending colon.  Polypectomy was performed with cold forceps.  All resections were complete and all polyp tissue was completely retrieved.   A sessile polyp measuring 5 mm in size was found in the transverse colon.  A polypectomy was performed with a cold snare.  The resection was complete and the polyp tissue was completely retrieved.   Three sessile polyps measuring 2-3 mm in size were found in the sigmoid colon just distal to the healthy-appearing colocolonic anastomosis.  Polypectomy was performed with cold forceps.  All resections were complete and all polyp tissue was completely retrieved.   Two sessile polyps ranging between 3-102mm in size were found in the  rectosigmoid colon and rectum.  Polypectomy was performed using cold snare.  All resections were complete and all polyp tissue was completely retrieved.   There was mild scattered diverticulosis noted in the ascending colon and descending colon.   There was evidence of a normal appearing prior surgical anastomosis in the sigmoid colon, located at approximately 33 cm from the dentate line.  Retroflexion was not performed due to a narrow rectal vault. The time to cecum=5 minutes 15 seconds.  Withdrawal time=19 minutes 12 seconds.  The scope was withdrawn and the procedure completed. COMPLICATIONS: There were no complications.  ENDOSCOPIC IMPRESSION: 1.   Two sessile polyps measuring 2-3 mm in size were found in the ascending colon; Polypectomy was performed with cold forceps 2.   Sessile polyp measuring 5 mm in size was found in the transverse colon; polypectomy was performed with a cold snare 3.   Three sessile polyps measuring 2-3 mm in size were found in the sigmoid colon; Polypectomy was performed with cold forceps 4.   Two sessile polyps ranging between 3-46mm in size were found in the rectosigmoid colon and rectum; Polypectomy was performed using cold snare 5.   There was mild diverticulosis noted in the ascending colon and descending colon 6.   There was evidence of prior surgical anastomosis in the sigmoid colon; normal appearing  RECOMMENDATIONS: 1.  Await pathology results 2.  High fiber diet 3.  Timing of repeat colonoscopy will be determined by pathology findings. 4.  You will receive a letter within 1-2 weeks with the results of your biopsy as well as final recommendations.  Please call my office if you have not received a letter after 3 weeks.  eSigned:  Beverley Fiedler, MD 10/21/2012 3:53 PM   cc: The Patient   PATIENT NAME:  Laura, [name]. MR#: [id number]

## 2012-10-22 ENCOUNTER — Telehealth: Payer: Self-pay | Admitting: *Deleted

## 2012-10-22 NOTE — Telephone Encounter (Signed)
  Follow up Call-  Call back number 10/21/2012  Post procedure Call Back phone  # [phone]  Permission to leave phone message Yes   [facility]

## 2012-10-25 ENCOUNTER — Telehealth: Payer: Self-pay | Admitting: Oncology

## 2012-10-25 ENCOUNTER — Telehealth: Payer: Self-pay | Admitting: *Deleted

## 2012-10-25 ENCOUNTER — Other Ambulatory Visit: Payer: Self-pay | Admitting: *Deleted

## 2012-10-25 DIAGNOSIS — R195 Other fecal abnormalities: Secondary | ICD-10-CM

## 2012-10-25 DIAGNOSIS — D649 Anemia, unspecified: Secondary | ICD-10-CM

## 2012-10-25 NOTE — Telephone Encounter (Signed)
Informed pt of her appt for SB Enteroscopy and that I will be mailing her instructions. She stated understanding,

## 2012-10-25 NOTE — Telephone Encounter (Signed)
Called pt to r/s appt to 05/21 @ 10:30 calendar mailed.

## 2012-10-25 NOTE — Telephone Encounter (Signed)
lmom for pt to call back. Scheduled pt for Small Bowel Enteroscopy with APC on 11/25/12 at 0830 am.

## 2012-10-26 ENCOUNTER — Telehealth: Payer: Self-pay | Admitting: Oncology

## 2012-10-26 NOTE — Telephone Encounter (Signed)
C/D 10/26/12 for appt5/21/14

## 2012-10-27 ENCOUNTER — Other Ambulatory Visit: Payer: Medicare Other | Admitting: Lab

## 2012-10-27 ENCOUNTER — Encounter: Payer: Self-pay | Admitting: Internal Medicine

## 2012-10-27 ENCOUNTER — Ambulatory Visit: Payer: Medicare Other | Admitting: Oncology

## 2012-10-27 ENCOUNTER — Ambulatory Visit: Payer: Medicare Other

## 2012-11-04 ENCOUNTER — Telehealth: Payer: Self-pay | Admitting: *Deleted

## 2012-11-04 NOTE — Telephone Encounter (Signed)
Laura [name] with [facility] endo just wants to make sure the procedure scheduled on 11/25/12 is NOT with MAC. Please, advise.

## 2012-11-04 NOTE — Telephone Encounter (Signed)
Yes, moderate sedation should be okay

## 2012-11-12 ENCOUNTER — Telehealth: Payer: Self-pay | Admitting: *Deleted

## 2012-11-12 ENCOUNTER — Other Ambulatory Visit: Payer: Self-pay | Admitting: *Deleted

## 2012-11-12 NOTE — Telephone Encounter (Signed)
sw pt made her aware that she will have an iron tx after seeing HTH on 11/17/12. I also emailed MW to add the tx for 11/17/12...td

## 2012-11-15 ENCOUNTER — Telehealth: Payer: Self-pay | Admitting: *Deleted

## 2012-11-15 NOTE — Telephone Encounter (Signed)
Per staff message and POF I have scheduled appts.  JMW  

## 2012-11-17 ENCOUNTER — Ambulatory Visit: Payer: Medicare Other

## 2012-11-17 ENCOUNTER — Encounter (HOSPITAL_COMMUNITY)
Admission: RE | Admit: 2012-11-17 | Discharge: 2012-11-17 | Disposition: A | Discharge status: Home / Self Care | Payer: Medicare Other | Source: Ambulatory Visit | Attending: Oncology | Admitting: Oncology

## 2012-11-17 ENCOUNTER — Encounter: Payer: Self-pay | Admitting: Oncology

## 2012-11-17 ENCOUNTER — Other Ambulatory Visit: Payer: Medicare Other | Admitting: Lab

## 2012-11-17 ENCOUNTER — Telehealth: Payer: Self-pay | Admitting: Oncology

## 2012-11-17 ENCOUNTER — Ambulatory Visit (HOSPITAL_BASED_OUTPATIENT_CLINIC_OR_DEPARTMENT_OTHER): Payer: Medicare Other | Admitting: Oncology

## 2012-11-17 ENCOUNTER — Other Ambulatory Visit (HOSPITAL_BASED_OUTPATIENT_CLINIC_OR_DEPARTMENT_OTHER): Payer: Medicare Other | Admitting: Lab

## 2012-11-17 ENCOUNTER — Ambulatory Visit (HOSPITAL_BASED_OUTPATIENT_CLINIC_OR_DEPARTMENT_OTHER): Payer: Medicare Other

## 2012-11-17 ENCOUNTER — Ambulatory Visit: Payer: Medicare Other | Admitting: Lab

## 2012-11-17 VITALS — BP 124/42 | HR 110 | Temp 98.0°F | Resp 18 | Ht 61.0 in | Wt 111.0 lb

## 2012-11-17 DIAGNOSIS — R195 Other fecal abnormalities: Secondary | ICD-10-CM

## 2012-11-17 DIAGNOSIS — D500 Iron deficiency anemia secondary to blood loss (chronic): Secondary | ICD-10-CM

## 2012-11-17 DIAGNOSIS — Z85038 Personal history of other malignant neoplasm of large intestine: Secondary | ICD-10-CM

## 2012-11-17 DIAGNOSIS — D509 Iron deficiency anemia, unspecified: Secondary | ICD-10-CM | POA: Insufficient documentation

## 2012-11-17 DIAGNOSIS — N949 Unspecified condition associated with female genital organs and menstrual cycle: Secondary | ICD-10-CM

## 2012-11-17 HISTORY — DX: Iron deficiency anemia secondary to blood loss (chronic): D50.0

## 2012-11-17 LAB — ABO/RH: ABO/RH(D): A POS

## 2012-11-17 LAB — COMPREHENSIVE METABOLIC PANEL (CC13)
Albumin: 3.7 g/dL (ref 3.5–5.0)
CO2: 25 mEq/L (ref 22–29)
Glucose: 147 mg/dl — ABNORMAL HIGH (ref 70–99)
Potassium: 3.8 mEq/L (ref 3.5–5.1)
Sodium: 139 mEq/L (ref 136–145)
Total Protein: 7.3 g/dL (ref 6.4–8.3)

## 2012-11-17 LAB — CBC WITH DIFFERENTIAL/PLATELET
BASO%: 0.6 % (ref 0.0–2.0)
Basophils Absolute: 0 10*3/uL (ref 0.0–0.1)
EOS%: 1.2 % (ref 0.0–7.0)
MCH: 21 pg — ABNORMAL LOW (ref 25.1–34.0)
MCHC: 29.4 g/dL — ABNORMAL LOW (ref 31.5–36.0)
MCV: 71.6 fL — ABNORMAL LOW (ref 79.5–101.0)
MONO%: 7.4 % (ref 0.0–14.0)
RBC: 2.84 10*6/uL — ABNORMAL LOW (ref 3.70–5.45)
RDW: 18 % — ABNORMAL HIGH (ref 11.2–14.5)
lymph#: 1.6 10*3/uL (ref 0.9–3.3)

## 2012-11-17 LAB — MORPHOLOGY

## 2012-11-17 LAB — HOLD TUBE, BLOOD BANK

## 2012-11-17 LAB — LACTATE DEHYDROGENASE (CC13): LDH: 172 U/L (ref 125–245)

## 2012-11-17 MED ORDER — ACETAMINOPHEN 325 MG PO TABS
650.0000 mg | ORAL_TABLET | Freq: Once | ORAL | Status: AC
  Administered 2012-11-17: 650 mg via ORAL

## 2012-11-17 MED ORDER — DIPHENHYDRAMINE HCL 25 MG PO CAPS
50.0000 mg | ORAL_CAPSULE | Freq: Once | ORAL | Status: AC
  Administered 2012-11-17: 50 mg via ORAL

## 2012-11-17 MED ORDER — SODIUM CHLORIDE 0.9 % IV SOLN
Freq: Once | INTRAVENOUS | Status: AC
  Administered 2012-11-17: 13:00:00 via INTRAVENOUS

## 2012-11-17 MED ORDER — SODIUM CHLORIDE 0.9 % IV SOLN
1020.0000 mg | Freq: Once | INTRAVENOUS | Status: AC
  Administered 2012-11-17: 1020 mg via INTRAVENOUS
  Filled 2012-11-17: qty 34

## 2012-11-17 NOTE — Telephone Encounter (Signed)
Pt sent back to lb for type & cross - PRBC's 5/23. Ok to send pt back to lb today per desk nurse. Pt given schedule for May thru August.

## 2012-11-17 NOTE — Patient Instructions (Addendum)
Ferumoxytol injection What is this medicine? FERUMOXYTOL is an iron complex. Iron is used to make healthy red blood cells, which carry oxygen and nutrients throughout the body. This medicine is used to treat iron deficiency anemia in people with chronic kidney disease. This medicine may be used for other purposes; ask your health care provider or pharmacist if you have questions. What should I tell my health care provider before I take this medicine? They need to know if you have any of these conditions: -anemia not caused by low iron levels -high levels of iron in the blood -magnetic resonance imaging (MRI) test scheduled -an unusual or allergic reaction to iron, other medicines, foods, dyes, or preservatives -pregnant or trying to get pregnant -breast-feeding How should I use this medicine? This medicine is for infusion into a vein. It is given by a health care professional in a hospital or clinic setting. Talk to your pediatrician regarding the use of this medicine in children. Special care may be needed. Overdosage: If you think you've taken too much of this medicine contact a poison control center or emergency room at once. Overdosage: If you think you have taken too much of this medicine contact a poison control center or emergency room at once. NOTE: This medicine is only for you. Do not share this medicine with others. What if I miss a dose? It is important not to miss your dose. Call your doctor or health care professional if you are unable to keep an appointment. What may interact with this medicine? This medicine may interact with the following medications: -other iron products This list may not describe all possible interactions. Give your health care provider a list of all the medicines, herbs, non-prescription drugs, or dietary supplements you use. Also tell them if you smoke, drink alcohol, or use illegal drugs. Some items may interact with your medicine. What should I watch  for while using this medicine? Visit your doctor or healthcare professional regularly. Tell your doctor or healthcare professional if your symptoms do not start to get better or if they get worse. You may need blood work done while you are taking this medicine. You may need to follow a special diet. Talk to your doctor. Foods that contain iron include: whole grains/cereals, dried fruits, beans, or peas, leafy green vegetables, and organ meats (liver, kidney). What side effects may I notice from receiving this medicine? Side effects that you should report to your doctor or health care professional as soon as possible: -allergic reactions like skin rash, itching or hives, swelling of the face, lips, or tongue -breathing problems -changes in blood pressure -feeling faint or lightheaded, falls -fever or chills -flushing, sweating, or hot feelings -swelling of the ankles or feet Side effects that usually do not require medical attention (Report these to your doctor or health care professional if they continue or are bothersome.): -diarrhea -headache -nausea, vomiting -stomach pain This list may not describe all possible side effects. Call your doctor for medical advice about side effects. You may report side effects to FDA at 1-800-FDA-1088. Where should I keep my medicine? This drug is given in a hospital or clinic and will not be stored at home. NOTE: This sheet is a summary. It may not cover all possible information. If you have questions about this medicine, talk to your doctor, pharmacist, or health care provider.  2013, [employer] Standard. (03/08/2008 9:48:25 PM)  

## 2012-11-17 NOTE — Progress Notes (Signed)
[facility]  [phone] [phone]     INITIAL HEMATOLOGY CONSULTATION    Referral MD:  Erick Blinks, M.D.   Reason for Referral:  Anemia.     HPI:  Laura [name]. Laura [name] is a 78 year-old woman.  She had normal Hgb in 2013.  She has been having fatigue for 1 year.  She was found to have severe anemia.  She underwent EGD and colonoscopy that were both negative.  She was kindly referred to the [facility] for evaluation.   She presented to the clinic today for the first time with her daughter.  She reported that she has no stamina.  She is still able to live by herself and is independent of all activities of daily living.  However, she takes rests during the day.  She has bene having vague diffuse pelvic discomfort. It does not radiate.  It is not severe enough for pain med.  She denied vaginal bleeding, hematuria, hematochezia, hemoptysis, hematemesis. She had positive fecal occult blood but no obvious bleeding. She has aternating diarrhea/constipation. She has low appetite and mild weight loss. She tried oral iron before with nausea/vomiting and constipation.  She does not want to take oral iron.   Patient denies fever, fatigue, headache, visual changes, confusion, drenching night sweats, palpable lymph node swelling, mucositis, odynophagia, dysphagia, nausea vomiting, jaundice, chest pain, palpitation, shortness of breath, dyspnea on exertion, productive cough, gum bleeding, epistaxis, abdominal swelling, early satiety, melena, hematochezia, hematuria, skin rash, spontaneous bleeding, joint swelling, joint pain, heat or cold intolerance, bowel bladder incontinence, back pain, focal motor weakness, paresthesia, depression.     Past Medical History  Diagnosis Date  . Type II diabetes mellitus     neuropathy  . Hypercholesteremia   . Hypertension   . Cancer of colon 1999    UVA again, not sure stage I or II, no chemo recommended.   . Chronic  obstructive bronchitis   . Anemia   . Hypothyroidism   . Idiopathic peripheral neuropathy   . Muscle weakness (generalized)   . Myofacial muscle pain   . Depression   . Renal colic   . Hx of vaginal bleeding   . Hx of colonic polyps   . Breast cancer 2001    bilatera cancer; bilateral mastectomy; no chemo; 5 years of adjuvant hormonal therapy.  Was at [facility], [place].   :    Past Surgical History  Procedure Laterality Date  . Partial colectomy    . Mastectomy      double  . Tonsillectomy    . Hernia repair    . Colon surgery    :   CURRENT MEDS: Current Outpatient Prescriptions  Medication Sig Dispense Refill  . ALPRAZolam (XANAX) 0.5 MG tablet Take 0.5 mg by mouth 3 (three) times daily.        . B Complex Vitamins (VITAMIN B COMPLEX PO) Take 1 tablet by mouth daily.      . cholecalciferol (VITAMIN D) 1000 UNITS tablet Take 1,000 Units by mouth daily.        . feeding supplement (PRO-STAT SUGAR FREE 64) LIQD Take 30 mLs by mouth daily. Take 1 tablespoon daily      . ferrous sulfate 325 (65 FE) MG tablet Take 325 mg by mouth daily with breakfast.        . Fluticasone-Salmeterol (ADVAIR) 100-50 MCG/DOSE AEPB Inhale 1 puff into the lungs every 12 (twelve) hours.        Marland Kitchen  levalbuterol (XOPENEX) 1.25 MG/0.5ML nebulizer solution Take 1 ampule by nebulization every 4 (four) hours as needed for wheezing.      Marland Kitchen levothyroxine (SYNTHROID, LEVOTHROID) 50 MCG tablet Take 50 mcg by mouth daily.        . metFORMIN (GLUCOPHAGE) 1000 MG tablet Take 1,000 mg by mouth 2 (two) times daily with a meal.        . omeprazole (PRILOSEC) 40 MG capsule Take 1 capsule (40 mg total) by mouth daily.  30 capsule  3  . simvastatin (ZOCOR) 10 MG tablet Take 10 mg by mouth at bedtime.        . triamcinolone (KENALOG) topical spray Apply topically 2 (two) times daily.       . vitamin B-12 (CYANOCOBALAMIN) 500 MCG tablet Take 500 mcg by mouth daily.       No current facility-administered medications for  this visit.      Allergies  Allergen Reactions  . Diclofenac Nausea And Vomiting  :  Family History  Problem Relation Age of Onset  . Breast cancer Maternal Aunt   . Breast cancer Daughter   . Lung cancer Brother     X2 died from  . Cancer Brother     cancer  . Lung cancer Sister     died from  . Cancer Mother 26    colon cancer  :  History   Social History  . Marital Status: Widowed    Spouse Name: N/A    Number of Children: 4  . Years of Education: N/A   Occupational History  .      [employer] (exposed to chemicals); school system, Conservation officer, nature.    Social History Main Topics  . Smoking status: Former Smoker -- 0.50 packs/day for 50 years    Types: Cigarettes  . Smokeless tobacco: Never Used  . Alcohol Use: No  . Drug Use: No  . Sexually Active: Not on file   Other Topics Concern  . Not on file   Social History Narrative  . No narrative on file  :  REVIEW OF SYSTEM:  The rest of the 14-point review of sytem was negative.   Exam: ECOG 1  General:  well-nourished woman, in no acute distress.  Eyes:  no scleral icterus.  ENT:  There were no oropharyngeal lesions.  Neck was without thyromegaly.  Lymphatics:  Negative cervical, supraclavicular or axillary adenopathy.  Respiratory: lungs were clear bilaterally without wheezing or crackles.  Cardiovascular:  Regular rate and rhythm, S1/S2, without murmur, rub or gallop.  There was no pedal edema.  GI:  abdomen was soft, flat, nontender, nondistended, without organomegaly.  Muscoloskeletal:  no spinal tenderness of palpation of vertebral spine.  Skin exam was without echymosis, petichae.  Neuro exam was nonfocal.  Patient was able to get on and off exam table without assistance.  Gait was normal.  Patient was alerted and oriented.  Attention was good.   Language was appropriate.  Mood was normal without depression.  Speech was not pressured.  Thought content was not tangential.    LABS:  Lab Results  Component Value Date     WBC 5.4 11/17/2012   HGB 6.0* 11/17/2012   HCT 20.3* 11/17/2012   PLT 348 11/17/2012   GLUCOSE 128* 05/08/2011   NA 141 05/08/2011   K 4.6 05/08/2011   CL 104 05/08/2011   CREATININE 0.80 05/08/2011   BUN 12 05/08/2011     ASSESSMENT AND PLAN:   1.  Iron deficiency  anemia:  Positive fecal occult blood.  Negative EGD and colonoscopy per GI.  I defer to GI to see if capsule endoscopy is indicated. - She has symptomatic anemia with Hgb of 6.  I recommended pRBC transfusion 2 units this week. - She has severe iron deficiency. She does not tolerate oral iron.  I recommended IV iron Feraheme 1,020 mg IV x 1.  There are potential side effects which include but not limited to myalgia/arthralgia, infusion reaction.   She expressed informed understanding and wished to proceed.   2.  Daughter with BCR2 positive breast cancer; patient with history of breast and colon cancer - I strongly urged Ms. Deguzman to see a Dentist for genetic testing.  - She did not want to be referred now.  This needs to be addressed in future visit again.  3.  Pelvic pain:  I advised her to follow up with PCP and Gyn for bimanual exam.  If pain persists or worsens, we may consider CT.   4.  Follow up:  Lab only appointment weekly until Hgb improves significantly.  Return visit in about 3 months.     I informed Ms. Prentiss that I am leaving the practice.  [facility] will arrange for her to follow up with a new provider.   Thank you for this referral.     The length of time of the face-to-face encounter was 45 minutes. More than 50% of time was spent counseling and coordination of care.    Khaleesi Gruel T. Gaylyn Rong, M.D.    Thank you for this referral.

## 2012-11-17 NOTE — Patient Instructions (Addendum)
1.  Diagnosis:  Severe iron deficiency anemia.  2.  Unclear source.   EGD and colonoscopy have been negative.   Follow up with Dr. Rhea Belton to see if capsule endoscopy is indicated.  3.  Treatment:   *  IV iron Feraheme to improve Hgb.  Potential side effects of Feraheme include but not limited to bone/muscle ache, infusion reaction.  *  Blood transfusion as well within 1 week to improve Hgb.  4.  Follow up:  Recheck blood count once a week, and will space it out once Hgb improves, and return visit in about 3 months.

## 2012-11-19 ENCOUNTER — Ambulatory Visit (HOSPITAL_BASED_OUTPATIENT_CLINIC_OR_DEPARTMENT_OTHER): Payer: Medicare Other

## 2012-11-19 VITALS — BP 134/66 | HR 73 | Temp 97.8°F | Resp 18

## 2012-11-19 DIAGNOSIS — D509 Iron deficiency anemia, unspecified: Secondary | ICD-10-CM

## 2012-11-19 MED ORDER — DIPHENHYDRAMINE HCL 25 MG PO CAPS
25.0000 mg | ORAL_CAPSULE | Freq: Once | ORAL | Status: AC
  Administered 2012-11-19: 25 mg via ORAL

## 2012-11-19 MED ORDER — ACETAMINOPHEN 325 MG PO TABS
650.0000 mg | ORAL_TABLET | Freq: Once | ORAL | Status: AC
  Administered 2012-11-19: 650 mg via ORAL

## 2012-11-19 MED ORDER — SODIUM CHLORIDE 0.9 % IV SOLN
250.0000 mL | Freq: Once | INTRAVENOUS | Status: AC
  Administered 2012-11-19: 250 mL via INTRAVENOUS

## 2012-11-19 NOTE — Patient Instructions (Addendum)
Blood Transfusion Information WHAT IS A BLOOD TRANSFUSION? A transfusion is the replacement of blood or some of its parts. Blood is made up of multiple cells which provide different functions.  Red blood cells carry oxygen and are used for blood loss replacement.  White blood cells fight against infection.  Platelets control bleeding.  Plasma helps clot blood.  Other blood products are available for specialized needs, such as hemophilia or other clotting disorders. BEFORE THE TRANSFUSION  Who gives blood for transfusions?   You may be able to donate blood to be used at a later date on yourself (autologous donation).  Relatives can be asked to donate blood. This is generally not any safer than if you have received blood from a stranger. The same precautions are taken to ensure safety when a relative's blood is donated.  Healthy volunteers who are fully evaluated to make sure their blood is safe. This is blood bank blood. Transfusion therapy is the safest it has ever been in the practice of medicine. Before blood is taken from a donor, a complete history is taken to make sure that person has no history of diseases nor engages in risky social behavior (examples are intravenous drug use or sexual activity with multiple partners). The donor's travel history is screened to minimize risk of transmitting infections, such as malaria. The donated blood is tested for signs of infectious diseases, such as HIV and hepatitis. The blood is then tested to be sure it is compatible with you in order to minimize the chance of a transfusion reaction. If you or a relative donates blood, this is often done in anticipation of surgery and is not appropriate for emergency situations. It takes many days to process the donated blood. RISKS AND COMPLICATIONS Although transfusion therapy is very safe and saves many lives, the main dangers of transfusion include:   Getting an infectious disease.  Developing a  transfusion reaction. This is an allergic reaction to something in the blood you were given. Every precaution is taken to prevent this. The decision to have a blood transfusion has been considered carefully by your caregiver before blood is given. Blood is not given unless the benefits outweigh the risks. AFTER THE TRANSFUSION  Right after receiving a blood transfusion, you will usually feel much better and more energetic. This is especially true if your red blood cells have gotten low (anemic). The transfusion raises the level of the red blood cells which carry oxygen, and this usually causes an energy increase.  The nurse administering the transfusion will monitor you carefully for complications. HOME CARE INSTRUCTIONS  No special instructions are needed after a transfusion. You may find your energy is better. Speak with your caregiver about any limitations on activity for underlying diseases you may have. SEEK MEDICAL CARE IF:   Your condition is not improving after your transfusion.  You develop redness or irritation at the intravenous (IV) site. SEEK IMMEDIATE MEDICAL CARE IF:  Any of the following symptoms occur over the next 12 hours:  Shaking chills.  You have a temperature by mouth above 102 F (38.9 C), not controlled by medicine.  Chest, back, or muscle pain.  People around you feel you are not acting correctly or are confused.  Shortness of breath or difficulty breathing.  Dizziness and fainting.  You get a rash or develop hives.  You have a decrease in urine output.  Your urine turns a dark color or changes to pink, red, or brown. Any of the following   symptoms occur over the next 10 days:  You have a temperature by mouth above 102 F (38.9 C), not controlled by medicine.  Shortness of breath.  Weakness after normal activity.  The white part of the eye turns yellow (jaundice).  You have a decrease in the amount of urine or are urinating less often.  Your  urine turns a dark color or changes to pink, red, or brown. Document Released: 06/13/2000 Document Revised: 09/08/2011 Document Reviewed: 01/31/2008 ExitCare Patient Information 2014 [employer].  

## 2012-11-20 LAB — TYPE AND SCREEN
Antibody Screen: NEGATIVE
Unit division: 0

## 2012-11-24 ENCOUNTER — Encounter: Payer: Self-pay | Admitting: Oncology

## 2012-11-24 ENCOUNTER — Other Ambulatory Visit (HOSPITAL_BASED_OUTPATIENT_CLINIC_OR_DEPARTMENT_OTHER): Payer: Medicare Other | Admitting: Lab

## 2012-11-24 DIAGNOSIS — D509 Iron deficiency anemia, unspecified: Secondary | ICD-10-CM

## 2012-11-24 LAB — COMPREHENSIVE METABOLIC PANEL (CC13)
ALT: 25 U/L (ref 0–55)
AST: 29 U/L (ref 5–34)
CO2: 26 mEq/L (ref 22–29)
Calcium: 9.4 mg/dL (ref 8.4–10.4)
Chloride: 109 mEq/L — ABNORMAL HIGH (ref 98–107)
Creatinine: 0.7 mg/dL (ref 0.6–1.1)
Sodium: 144 mEq/L (ref 136–145)
Total Protein: 6.8 g/dL (ref 6.4–8.3)

## 2012-11-24 LAB — CBC WITH DIFFERENTIAL/PLATELET
BASO%: 0.4 % (ref 0.0–2.0)
Eosinophils Absolute: 0.1 10*3/uL (ref 0.0–0.5)
LYMPH%: 20.3 % (ref 14.0–49.7)
MCHC: 30.1 g/dL — ABNORMAL LOW (ref 31.5–36.0)
MONO#: 0.4 10*3/uL (ref 0.1–0.9)
NEUT#: 3.1 10*3/uL (ref 1.5–6.5)
Platelets: 212 10*3/uL (ref 145–400)
RBC: 4.24 10*6/uL (ref 3.70–5.45)
RDW: 23.8 % — ABNORMAL HIGH (ref 11.2–14.5)
WBC: 4.6 10*3/uL (ref 3.9–10.3)
lymph#: 0.9 10*3/uL (ref 0.9–3.3)
nRBC: 0 % (ref 0–0)

## 2012-11-25 ENCOUNTER — Ambulatory Visit (HOSPITAL_COMMUNITY)
Admission: RE | Admit: 2012-11-25 | Discharge: 2012-11-25 | Disposition: A | Discharge status: Home / Self Care | Payer: Medicare Other | Source: Ambulatory Visit | Attending: Internal Medicine | Admitting: Internal Medicine

## 2012-11-25 ENCOUNTER — Ambulatory Visit (HOSPITAL_COMMUNITY): Payer: Medicare Other | Admitting: Registered Nurse

## 2012-11-25 ENCOUNTER — Encounter (HOSPITAL_COMMUNITY): Payer: Self-pay | Admitting: *Deleted

## 2012-11-25 ENCOUNTER — Other Ambulatory Visit: Payer: Self-pay | Admitting: *Deleted

## 2012-11-25 ENCOUNTER — Encounter (HOSPITAL_COMMUNITY)
Admission: RE | Disposition: A | Discharge status: Home / Self Care | Payer: Self-pay | Source: Ambulatory Visit | Attending: Internal Medicine

## 2012-11-25 ENCOUNTER — Encounter (HOSPITAL_COMMUNITY): Payer: Self-pay | Admitting: Registered Nurse

## 2012-11-25 DIAGNOSIS — K31811 Angiodysplasia of stomach and duodenum with bleeding: Secondary | ICD-10-CM

## 2012-11-25 DIAGNOSIS — Z85038 Personal history of other malignant neoplasm of large intestine: Secondary | ICD-10-CM | POA: Insufficient documentation

## 2012-11-25 DIAGNOSIS — R195 Other fecal abnormalities: Secondary | ICD-10-CM

## 2012-11-25 DIAGNOSIS — Z853 Personal history of malignant neoplasm of breast: Secondary | ICD-10-CM | POA: Insufficient documentation

## 2012-11-25 DIAGNOSIS — E119 Type 2 diabetes mellitus without complications: Secondary | ICD-10-CM | POA: Insufficient documentation

## 2012-11-25 DIAGNOSIS — Z9049 Acquired absence of other specified parts of digestive tract: Secondary | ICD-10-CM | POA: Insufficient documentation

## 2012-11-25 DIAGNOSIS — D500 Iron deficiency anemia secondary to blood loss (chronic): Secondary | ICD-10-CM

## 2012-11-25 DIAGNOSIS — E785 Hyperlipidemia, unspecified: Secondary | ICD-10-CM | POA: Insufficient documentation

## 2012-11-25 DIAGNOSIS — E890 Postprocedural hypothyroidism: Secondary | ICD-10-CM | POA: Insufficient documentation

## 2012-11-25 DIAGNOSIS — Z87891 Personal history of nicotine dependence: Secondary | ICD-10-CM | POA: Insufficient documentation

## 2012-11-25 DIAGNOSIS — D649 Anemia, unspecified: Secondary | ICD-10-CM

## 2012-11-25 DIAGNOSIS — Z9010 Acquired absence of unspecified breast and nipple: Secondary | ICD-10-CM | POA: Insufficient documentation

## 2012-11-25 DIAGNOSIS — I10 Essential (primary) hypertension: Secondary | ICD-10-CM | POA: Insufficient documentation

## 2012-11-25 HISTORY — PX: ENTEROSCOPY: SHX5533

## 2012-11-25 LAB — GLUCOSE, CAPILLARY: Glucose-Capillary: 116 mg/dL — ABNORMAL HIGH (ref 70–99)

## 2012-11-25 SURGERY — ENTEROSCOPY
Anesthesia: Monitor Anesthesia Care

## 2012-11-25 MED ORDER — GLUCAGON HCL (RDNA) 1 MG IJ SOLR
INTRAMUSCULAR | Status: AC
  Filled 2012-11-25: qty 2

## 2012-11-25 MED ORDER — LABETALOL HCL 5 MG/ML IV SOLN
INTRAVENOUS | Status: DC | PRN
  Administered 2012-11-25: 2.5 mg via INTRAVENOUS
  Administered 2012-11-25: 5 mg via INTRAVENOUS

## 2012-11-25 MED ORDER — FENTANYL CITRATE 0.05 MG/ML IJ SOLN
INTRAMUSCULAR | Status: DC | PRN
  Administered 2012-11-25: 50 ug via INTRAVENOUS

## 2012-11-25 MED ORDER — PROPOFOL INFUSION 10 MG/ML OPTIME
INTRAVENOUS | Status: DC | PRN
  Administered 2012-11-25: 160 ug/kg/min via INTRAVENOUS

## 2012-11-25 MED ORDER — SODIUM CHLORIDE 0.9 % IV SOLN
INTRAVENOUS | Status: DC
  Administered 2012-11-25: 09:00:00 via INTRAVENOUS

## 2012-11-25 MED ORDER — MIDAZOLAM HCL 5 MG/5ML IJ SOLN
INTRAMUSCULAR | Status: DC | PRN
  Administered 2012-11-25: 1 mg via INTRAVENOUS

## 2012-11-25 MED ORDER — MIDAZOLAM HCL 10 MG/2ML IJ SOLN
INTRAMUSCULAR | Status: DC | PRN
  Administered 2012-11-25 (×2): 2 mg via INTRAVENOUS
  Administered 2012-11-25 (×2): 1 mg via INTRAVENOUS
  Administered 2012-11-25: 2 mg via INTRAVENOUS

## 2012-11-25 MED ORDER — LACTATED RINGERS IV SOLN
INTRAVENOUS | Status: DC | PRN
  Administered 2012-11-25: 11:00:00 via INTRAVENOUS

## 2012-11-25 MED ORDER — MIDAZOLAM HCL 10 MG/2ML IJ SOLN
INTRAMUSCULAR | Status: AC
  Filled 2012-11-25: qty 2

## 2012-11-25 MED ORDER — FENTANYL CITRATE 0.05 MG/ML IJ SOLN
INTRAMUSCULAR | Status: AC
  Filled 2012-11-25: qty 2

## 2012-11-25 MED ORDER — LIDOCAINE HCL (CARDIAC) 20 MG/ML IV SOLN
INTRAVENOUS | Status: DC | PRN
  Administered 2012-11-25: 50 mg via INTRAVENOUS

## 2012-11-25 MED ORDER — FENTANYL CITRATE 0.05 MG/ML IJ SOLN
INTRAMUSCULAR | Status: DC | PRN
  Administered 2012-11-25 (×3): 25 ug via INTRAVENOUS
  Administered 2012-11-25 (×2): 12.5 ug via INTRAVENOUS

## 2012-11-25 MED ORDER — GLUCAGON HCL (RDNA) 1 MG IJ SOLR
INTRAMUSCULAR | Status: AC
  Filled 2012-11-25: qty 1

## 2012-11-25 MED ORDER — GLUCAGON HCL (RDNA) 1 MG IJ SOLR
INTRAMUSCULAR | Status: DC | PRN
  Administered 2012-11-25: .5 mg via INTRAVENOUS

## 2012-11-25 MED ORDER — BUTAMBEN-TETRACAINE-BENZOCAINE 2-2-14 % EX AERO
INHALATION_SPRAY | CUTANEOUS | Status: DC | PRN
  Administered 2012-11-25: 2 via TOPICAL

## 2012-11-25 MED ORDER — MIDAZOLAM HCL 10 MG/2ML IJ SOLN
INTRAMUSCULAR | Status: AC
Start: 2012-11-25 — End: 2012-11-25
  Filled 2012-11-25: qty 2

## 2012-11-25 NOTE — H&P (Signed)
HPI: Laura [name] is a 78 yo female with PMH of colon cancer diagnosed and treated in the 1999 at [facility] with what sounds like sigmoidectomy (no report of chemotherapy or radiation), breast cancer currently in remission (dx 2000), hypertension, diabetes, hypothyroidism, who is today for small bowel enteroscopy for treatment of previously documented angioectasias of the duodenum.  Endoscopy was performed on 10/21/2012 and revealed small bowel angioectasia, not treated immediately due to lack of APC. Since being seen in my office she has seen Dr. Gaylyn Rong and received blood transfusion and intravenous iron.  He feels some better, though continues to struggle with fatigue. She is intermittently having dark stools though not frankly black. No right red blood per rectum or hematochezia. Hemoglobin has significantly improved and was 11 when recently checked, which was significantly improved from her previous hemoglobin in the 6 range.  No fevers or chills   Patient Active Problem List   Diagnosis Date Noted  . Iron deficiency anemia secondary to blood loss (chronic) 11/17/2012  . HYPOTHYROIDISM, POST-RADIATION 11/13/2008  . COLON CANCER, HX OF 12/10/2007  . BREAST CANCER, HX OF 12/10/2007  . NEOPLASM, MALIGNANT, SIGMOID COLON 12/09/2007  . MALIGNANT NEOPLASM CENTRAL PORTION FEMALE BREAST 12/09/2007  . DIABETES MELLITUS, TYPE II 12/09/2007  . HYPERLIPIDEMIA 12/09/2007  . TOBACCO ABUSE 12/09/2007  . DEPRESSION 12/09/2007  . PERIPHERAL NEUROPATHY 12/09/2007  . UPPER RESPIRATORY INFECTION 12/09/2007  . BACK PAIN 12/09/2007  . PALPITATIONS 12/09/2007    Past Surgical History  Procedure Laterality Date  . Partial colectomy    . Mastectomy      double  . Tonsillectomy    . Colon surgery    . Hernia repair      umblical hernia    Current Facility-Administered Medications  Medication Dose Route Frequency Provider Last Rate Last Dose  . 0.9 %  sodium chloride infusion   Intravenous Continuous Beverley Fiedler, MD 20 mL/hr at 11/25/12 [phone]    . butamben-tetracaine-benzocaine (CETACAINE) spray    PRN Beverley Fiedler, MD   2 spray at 11/25/12 [phone]  . fentaNYL (SUBLIMAZE) injection    PRN Beverley Fiedler, MD   25 mcg at 11/25/12 0853  . glucagon (GLUCAGEN) injection    PRN Beverley Fiedler, MD   0.5 mg at 11/25/12 0903  . midazolam (VERSED) injection    PRN Beverley Fiedler, MD   2 mg at 11/25/12 [phone]    Allergies  Allergen Reactions  . Diclofenac Nausea And Vomiting    Family History  Problem Relation Age of Onset  . Breast cancer Maternal Aunt   . Breast cancer Daughter   . Lung cancer Brother     X2 died from  . Cancer Brother     cancer  . Lung cancer Sister     died from  . Cancer Mother 2    colon cancer    History  Substance Use Topics  . Smoking status: Former Smoker -- 0.50 packs/day for 50 years    Types: Cigarettes    Quit date: 06/27/2012  . Smokeless tobacco: Never Used  . Alcohol Use: No    ROS: As per history of present illness, otherwise negative  BP 160/82  Pulse 114  Temp(Src) 98.3 F (36.8 C) (Oral)  Resp 22  SpO2 100% Constitutional: Well-developed and well-nourished. No distress. HEENT: Normocephalic and atraumatic. Oropharynx is clear and moist.No scleral icterus. Neck: Neck supple. Trachea midline. Cardiovascular: Normal rate, regular rhythm and intact distal pulses.  Pulmonary/chest:  Effort normal and breath sounds normal. No wheezing, rales or rhonchi. Abdominal: Soft, nontender, nondistended. Bowel sounds active throughout. There are no masses palpable. No hepatosplenomegaly. Extremities: no clubbing, cyanosis, or edema Neurological: Alert and oriented to person place and time. Skin: Skin is warm and dry. No rashes noted. Psychiatric: Normal mood and affect. Behavior is normal.  RELEVANT LABS AND IMAGING: CBC    Component Value Date/Time   WBC 4.6 11/24/2012 0816   WBC 4.3* 10/05/2012 1059   RBC 4.24 11/24/2012 0816   RBC 2.84* 10/05/2012 1059   HGB  11.0* 11/24/2012 0816   HGB 6.4 Repeated and verified X2.* 10/05/2012 1059   HCT 36.6 11/24/2012 0816   HCT 21.4 Repeated and verified X2.* 10/05/2012 1059   PLT 212 11/24/2012 0816   PLT 364.0 10/05/2012 1059   MCV 86.3 11/24/2012 0816   MCV 75.4 Repeated and verified X2.* 10/05/2012 1059   MCH 25.9 11/24/2012 0816   MCH 29.0 05/08/2011 1625   MCHC 30.1* 11/24/2012 0816   MCHC 29.9* 10/05/2012 1059   RDW 23.8* 11/24/2012 0816   RDW 17.2* 10/05/2012 1059   LYMPHSABS 0.9 11/24/2012 0816   LYMPHSABS 1.5 05/08/2011 1625   MONOABS 0.4 11/24/2012 0816   MONOABS 0.4 05/08/2011 1625   EOSABS 0.1 11/24/2012 0816   EOSABS 0.1 05/08/2011 1625   BASOSABS 0.0 11/24/2012 0816   BASOSABS 0.0 05/08/2011 1625    CMP     Component Value Date/Time   NA 144 11/24/2012 0816   NA 141 05/08/2011 1644   K 3.7 11/24/2012 0816   K 4.6 05/08/2011 1644   CL 109* 11/24/2012 0816   CL 104 05/08/2011 1644   CO2 26 11/24/2012 0816   GLUCOSE 102* 11/24/2012 0816   GLUCOSE 128* 05/08/2011 1644   BUN 11.7 11/24/2012 0816   BUN 12 05/08/2011 1644   CREATININE 0.7 11/24/2012 0816   CREATININE 0.80 05/08/2011 1644   CALCIUM 9.4 11/24/2012 0816   PROT 6.8 11/24/2012 0816   ALBUMIN 3.8 11/24/2012 0816   AST 29 11/24/2012 0816   ALT 25 11/24/2012 0816   ALKPHOS 86 11/24/2012 0816   BILITOT 0.52 11/24/2012 0816    ASSESSMENT/PLAN: 78 yo female with PMH of colon cancer diagnosed and treated in the 1999 at [facility] with what sounds like sigmoidectomy (no report of chemotherapy or radiation), breast cancer currently in remission (dx 2000), hypertension, diabetes, hypothyroidism, who is today for small bowel enteroscopy for treatment of previously documented angioectasias of the duodenum.  1.  Iron def anemia, small bowel angioectasias -- patient presents today for small bowel arthroscopy. This procedure was scheduled and initiated with moderate sedation. He small bowel enteroscope was passed into the second portion of the duodenum but due to agitation and  complete sedation the exam patient was aborted. 1 gastric angioectasia, and one duodenal angioectasia were seen but not treated due to a complete sedation and poor patient tolerability.  The patient remained stable through the procedure, and has been removed to recovery. I will ask anesthesia for help with monitored anesthesia care for deeper sedation and repeat small bowel enteroscopy with likely APC. Discussed this with the patient and her daughter, Laura [name], they understand the plan and are agreeable to proceed.

## 2012-11-25 NOTE — Anesthesia Preprocedure Evaluation (Addendum)
Anesthesia Evaluation  Patient identified by MRN, date of birth, ID band Patient awake    Reviewed: Allergy & Precautions, H&P , NPO status , Patient's Chart, lab work & pertinent test results  Airway Mallampati: II TM Distance: >3 FB Neck ROM: Full    Dental  (+) Edentulous Upper, Edentulous Lower and Dental Advisory Given   Pulmonary neg pulmonary ROS, COPDCurrent Smoker,  breath sounds clear to auscultation  Pulmonary exam normal       Cardiovascular hypertension, Pt. on medications negative cardio ROS  Rhythm:Regular Rate:Normal     Neuro/Psych Depression  Neuromuscular disease negative neurological ROS  negative psych ROS   GI/Hepatic negative GI ROS, Neg liver ROS,   Endo/Other  diabetes, Well Controlled, Type 2, Oral Hypoglycemic AgentsHypothyroidism   Renal/GU negative Renal ROS  negative genitourinary   Musculoskeletal negative musculoskeletal ROS (+)   Abdominal   Peds  Hematology negative hematology ROS (+)   Anesthesia Other Findings Prior history of colon and breast cancer  Reproductive/Obstetrics                          Anesthesia Physical Anesthesia Plan  ASA: III  Anesthesia Plan: MAC   Post-op Pain Management:    Induction: Intravenous  Airway Management Planned: Nasal Cannula  Additional Equipment:   Intra-op Plan:   Post-operative Plan: Extubation in OR  Informed Consent: I have reviewed the patients History and Physical, chart, labs and discussed the procedure including the risks, benefits and alternatives for the proposed anesthesia with the patient or authorized representative who has indicated his/her understanding and acceptance.   Dental advisory given  Plan Discussed with: CRNA  Anesthesia Plan Comments:         Anesthesia Quick Evaluation

## 2012-11-25 NOTE — Transfer of Care (Signed)
Immediate Anesthesia Transfer of Care Note  Patient: Laura [name]  Procedure(s) Performed: Procedure(s) with comments: ENTEROSCOPY (N/A) - with apc  Patient Location: PACU  Anesthesia Type:MAC  Level of Consciousness: awake, alert , oriented and patient cooperative  Airway & Oxygen Therapy: Patient Spontanous Breathing and Patient connected to nasal cannula oxygen  Post-op Assessment: Report given to PACU RN, Post -op Vital signs reviewed and stable and Patient moving all extremities X 4  Post vital signs: stable  Complications: No apparent anesthesia complications

## 2012-11-25 NOTE — Op Note (Signed)
[facility] [address] [place], [address]   OPERATIVE PROCEDURE REPORT  PATIENT: Laura [name], Laura [name].  MR#: [id number] BIRTHDATE: 06-Apr-1935 , 77  yrs. old GENDER: Female ENDOSCOPIST: Beverley Fiedler, MD PROCEDURE DATE: 11/25/2012 PROCEDURE:   Small bowel enteroscopy with ablation therapy ASA CLASS:   Class III INDICATIONS:1.  control bleeding.   2.  iron deficiency anemia.   3. a-v malformation. MEDICATIONS: MAC sedation, administered by CRNA and See Anesthesia Report. TOPICAL ANESTHETIC:   Cetacaine Spray  DESCRIPTION OF PROCEDURE:   After the risks benefits and alternatives of the procedure were thoroughly explained, informed consent was obtained.  The Pentax VSB-2900  endoscope was introduced through the mouth  and advanced to the proximal jejunum jejunum , limited by Without limitations.   The instrument was slowly withdrawn as the mucosa was fully examined.    The esophagus and gastroesophageal junction were completely normal in appearance.   A nonbleeding angioectasiaa was found in the gastric cardia, measuring approximately 4 mm in size.  APC ablation was performed with success (1L/min, 40 W).  The remainder of the stomach was normal in appearance.  The duodenal bulb was normal in appearance. 3 small (3-4 mm) duodenal angioectasias were found in the second and third portion of the duodenum, one of which was actively bleeding.  APC was again used for ablation at the 3 distinct locations (1L/min, 30 W). There was oozing at one APC location and thus one hemostatic clip was placed with success at this spot.  there was no bleeding at the end of the procedure. Estimated blood loss, minimal.  Retroflexed views revealed no abnormalities.    The scope was then withdrawn from the patient and the procedure terminated.  COMPLICATIONS: There were no complications.  ENDOSCOPIC IMPRESSION: 1.   The esophagus and gastroesophageal junction were completely normal in  appearance. 2.   4 mm angioectasia was found in the cardia; Successful APC ablation 3.   Otherwise normal stomach 4.   Three 3-4 mm angioectasias found in the second and third portion of the duodenum; successful APC ablation.  Hemostatic clip times one applied with success  RECOMMENDATIONS: 1.   Daily PPI 2.   Closely monitor hemoglobin, iron studies. Followup with hematology as scheduled 3.   Office followup in about one month eSigned:  Beverley Fiedler, MD 11/25/2012 11:17 AM CC:Ha, Raliegh Ip MD The Patient [name], MD

## 2012-11-25 NOTE — Anesthesia Postprocedure Evaluation (Signed)
Anesthesia Post Note  Patient: Laura [name]  Procedure(s) Performed: Procedure(s) (LRB): ENTEROSCOPY (N/A)  Anesthesia type: MAC  Patient location: PACU  Post pain: Pain level controlled  Post assessment: Post-op Vital signs reviewed  Last Vitals:  Filed Vitals:   11/25/12 1120  BP: 139/74  Pulse:   Temp:   Resp: 22    Post vital signs: Reviewed  Level of consciousness: sedated  Complications: No apparent anesthesia complications

## 2012-11-28 ENCOUNTER — Emergency Department (HOSPITAL_COMMUNITY)
Admission: EM | Admit: 2012-11-28 | Discharge: 2012-11-28 | Disposition: A | Discharge status: Home / Self Care | Payer: Medicare Other | Attending: Emergency Medicine | Admitting: Emergency Medicine

## 2012-11-28 ENCOUNTER — Encounter (HOSPITAL_COMMUNITY): Payer: Self-pay | Admitting: *Deleted

## 2012-11-28 DIAGNOSIS — J441 Chronic obstructive pulmonary disease with (acute) exacerbation: Secondary | ICD-10-CM | POA: Insufficient documentation

## 2012-11-28 DIAGNOSIS — Z8739 Personal history of other diseases of the musculoskeletal system and connective tissue: Secondary | ICD-10-CM | POA: Insufficient documentation

## 2012-11-28 DIAGNOSIS — IMO0002 Reserved for concepts with insufficient information to code with codable children: Secondary | ICD-10-CM | POA: Insufficient documentation

## 2012-11-28 DIAGNOSIS — F3289 Other specified depressive episodes: Secondary | ICD-10-CM | POA: Insufficient documentation

## 2012-11-28 DIAGNOSIS — E039 Hypothyroidism, unspecified: Secondary | ICD-10-CM | POA: Insufficient documentation

## 2012-11-28 DIAGNOSIS — Z85038 Personal history of other malignant neoplasm of large intestine: Secondary | ICD-10-CM | POA: Insufficient documentation

## 2012-11-28 DIAGNOSIS — Z860100 Personal history of colon polyps, unspecified: Secondary | ICD-10-CM | POA: Insufficient documentation

## 2012-11-28 DIAGNOSIS — G609 Hereditary and idiopathic neuropathy, unspecified: Secondary | ICD-10-CM | POA: Insufficient documentation

## 2012-11-28 DIAGNOSIS — I10 Essential (primary) hypertension: Secondary | ICD-10-CM | POA: Insufficient documentation

## 2012-11-28 DIAGNOSIS — R040 Epistaxis: Secondary | ICD-10-CM | POA: Insufficient documentation

## 2012-11-28 DIAGNOSIS — F411 Generalized anxiety disorder: Secondary | ICD-10-CM | POA: Insufficient documentation

## 2012-11-28 DIAGNOSIS — Z8742 Personal history of other diseases of the female genital tract: Secondary | ICD-10-CM | POA: Insufficient documentation

## 2012-11-28 DIAGNOSIS — F329 Major depressive disorder, single episode, unspecified: Secondary | ICD-10-CM | POA: Insufficient documentation

## 2012-11-28 DIAGNOSIS — R10819 Abdominal tenderness, unspecified site: Secondary | ICD-10-CM | POA: Insufficient documentation

## 2012-11-28 DIAGNOSIS — Z79899 Other long term (current) drug therapy: Secondary | ICD-10-CM | POA: Insufficient documentation

## 2012-11-28 DIAGNOSIS — Z8719 Personal history of other diseases of the digestive system: Secondary | ICD-10-CM | POA: Insufficient documentation

## 2012-11-28 DIAGNOSIS — E1149 Type 2 diabetes mellitus with other diabetic neurological complication: Secondary | ICD-10-CM | POA: Insufficient documentation

## 2012-11-28 DIAGNOSIS — Z87891 Personal history of nicotine dependence: Secondary | ICD-10-CM | POA: Insufficient documentation

## 2012-11-28 DIAGNOSIS — R51 Headache: Secondary | ICD-10-CM | POA: Insufficient documentation

## 2012-11-28 DIAGNOSIS — E7800 Pure hypercholesterolemia, unspecified: Secondary | ICD-10-CM | POA: Insufficient documentation

## 2012-11-28 DIAGNOSIS — D500 Iron deficiency anemia secondary to blood loss (chronic): Secondary | ICD-10-CM | POA: Insufficient documentation

## 2012-11-28 DIAGNOSIS — Z853 Personal history of malignant neoplasm of breast: Secondary | ICD-10-CM | POA: Insufficient documentation

## 2012-11-28 DIAGNOSIS — R110 Nausea: Secondary | ICD-10-CM | POA: Insufficient documentation

## 2012-11-28 DIAGNOSIS — Z87448 Personal history of other diseases of urinary system: Secondary | ICD-10-CM | POA: Insufficient documentation

## 2012-11-28 LAB — GLUCOSE, CAPILLARY: Glucose-Capillary: 104 mg/dL — ABNORMAL HIGH (ref 70–99)

## 2012-11-28 LAB — PROTIME-INR
INR: 0.99 (ref 0.00–1.49)
Prothrombin Time: 13 seconds (ref 11.6–15.2)

## 2012-11-28 LAB — CBC
HCT: 36.8 % (ref 36.0–46.0)
Hemoglobin: 11.4 g/dL — ABNORMAL LOW (ref 12.0–15.0)
MCH: 27.2 pg (ref 26.0–34.0)
MCHC: 31 g/dL (ref 30.0–36.0)
MCV: 87.8 fL (ref 78.0–100.0)
Platelets: 195 10*3/uL (ref 150–400)
RBC: 4.19 MIL/uL (ref 3.87–5.11)
RDW: 26.8 % — ABNORMAL HIGH (ref 11.5–15.5)
WBC: 4.1 10*3/uL (ref 4.0–10.5)

## 2012-11-28 LAB — URINE MICROSCOPIC-ADD ON

## 2012-11-28 LAB — BASIC METABOLIC PANEL
BUN: 11 mg/dL (ref 6–23)
CO2: 25 mEq/L (ref 19–32)
Calcium: 9.6 mg/dL (ref 8.4–10.5)
Chloride: 108 mEq/L (ref 96–112)
Creatinine, Ser: 0.63 mg/dL (ref 0.50–1.10)
GFR calc Af Amer: >90 mL/min (ref >90)
GFR calc non Af Amer: 86 mL/min — ABNORMAL LOW (ref >90)
Glucose, Bld: 99 mg/dL (ref 70–99)
Potassium: 3.9 mEq/L (ref 3.5–5.1)
Sodium: 142 mEq/L (ref 135–145)

## 2012-11-28 LAB — URINALYSIS, ROUTINE W REFLEX MICROSCOPIC
Bilirubin Urine: NEGATIVE
Glucose, UA: NEGATIVE mg/dL
Hgb urine dipstick: NEGATIVE
Ketones, ur: NEGATIVE mg/dL
Nitrite: NEGATIVE
Protein, ur: NEGATIVE mg/dL
Specific Gravity, Urine: 1.008 (ref 1.005–1.030)
Urobilinogen, UA: 0.2 mg/dL (ref 0.0–1.0)
pH: 6 (ref 5.0–8.0)

## 2012-11-28 LAB — APTT: aPTT: 28 seconds (ref 24–37)

## 2012-11-28 MED ORDER — ALPRAZOLAM 0.25 MG PO TABS
0.2500 mg | ORAL_TABLET | Freq: Three times a day (TID) | ORAL | Status: DC | PRN
Start: 2012-11-28 — End: 2015-11-16

## 2012-11-28 NOTE — ED Provider Notes (Signed)
Medical screening examination/treatment/procedure(s) were performed by non-physician practitioner and as supervising physician I was immediately available for consultation/collaboration.  Pt seen and examined--no active bleeding, pt offered nasal packing and has deferred  Toy Baker, MD 11/28/12 1303

## 2012-11-28 NOTE — ED Provider Notes (Signed)
History     CSN: [id number]  Arrival date & time 11/28/12  1052   First MD Initiated Contact with Patient 11/28/12 1109      Chief Complaint  Patient presents with  . Epistaxis    (Consider location/radiation/quality/duration/timing/severity/associated sxs/prior treatment) Patient is a 78 y.o. female presenting with nosebleeds. The history is provided by the patient and a relative. No language interpreter was used.  Epistaxis Location:  R nare Severity:  Severe Duration:  1 hour Timing:  Sporadic Progression:  Resolved Chronicity:  New Context comment:  Recent transfusion, 2 weeks ago Relieved by:  Applying pressure Associated symptoms: headaches ( mild)   Associated symptoms: no congestion, no cough, no facial pain and no fever   Pt BIB daughter after 2 episodes for severe nose bleeds lasting 1 hour each.  First episode was yesterday afternoon.  Pt was able to control bleeding at home and did not feel she needed medical attention.  Second episode started this morning after waking up.  Bleeding was profuse so pt believed she needed to come in for further evaluation.  Pt reports blood transfusion 2 weeks ago for severe anemia likely due to bleeding ulcers which have since been treated.  Denies hx of blood disorder or taking blood thinners.  Denies trauma to nose.  Denies recent illness.  Pt is not actively bleeding.   Past Medical History  Diagnosis Date  . Type II diabetes mellitus     neuropathy  . Hypercholesteremia   . Hypertension   . Cancer of colon 1999    UVA again, not sure stage I or II, no chemo recommended.   . Chronic obstructive bronchitis   . Anemia   . Hypothyroidism   . Idiopathic peripheral neuropathy   . Muscle weakness (generalized)   . Myofacial muscle pain   . Depression   . Renal colic   . Hx of vaginal bleeding   . Hx of colonic polyps   . Breast cancer 2001    bilatera cancer; bilateral mastectomy; no chemo; 5 years of adjuvant hormonal therapy.   Was at [facility], [place].   . Iron deficiency anemia secondary to blood loss (chronic) 11/17/2012  . Iron deficiency anemia secondary to blood loss (chronic) 11/17/2012    Past Surgical History  Procedure Laterality Date  . Partial colectomy    . Mastectomy      double  . Tonsillectomy    . Colon surgery    . Hernia repair      umblical hernia    Family History  Problem Relation Age of Onset  . Breast cancer Maternal Aunt   . Breast cancer Daughter   . Lung cancer Brother     X2 died from  . Cancer Brother     cancer  . Lung cancer Sister     died from  . Cancer Mother 36    colon cancer    History  Substance Use Topics  . Smoking status: Former Smoker -- 0.50 packs/day for 50 years    Types: Cigarettes    Quit date: 06/27/2012  . Smokeless tobacco: Never Used  . Alcohol Use: No    OB History   Grav Para Term Preterm Abortions TAB SAB Ect Mult Living                  Review of Systems  Constitutional: Negative for fever, chills and fatigue.  HENT: Positive for nosebleeds ( x2). Negative for congestion.   Respiratory: Negative for  cough.   Cardiovascular: Negative for chest pain.  Gastrointestinal: Positive for nausea. Negative for vomiting, diarrhea and constipation.  Neurological: Positive for headaches ( mild).  All other systems reviewed and are negative.    Allergies  Diclofenac  Home Medications   Current Outpatient Rx  Name  Route  Sig  Dispense  Refill  . ALPRAZolam (XANAX) 0.5 MG tablet   Oral   Take 0.5 mg by mouth 3 (three) times daily.           . feeding supplement (PRO-STAT SUGAR FREE 64) LIQD   Oral   Take 30 mLs by mouth daily. Take 1 tablespoon daily         . Fluticasone-Salmeterol (ADVAIR) 100-50 MCG/DOSE AEPB   Inhalation   Inhale 1 puff into the lungs every 12 (twelve) hours.           Marland Kitchen levalbuterol (XOPENEX) 1.25 MG/0.5ML nebulizer solution   Nebulization   Take 1 ampule by nebulization every 4 (four) hours as  needed for wheezing.         Marland Kitchen levothyroxine (SYNTHROID, LEVOTHROID) 50 MCG tablet   Oral   Take 50 mcg by mouth daily.           . metFORMIN (GLUCOPHAGE) 1000 MG tablet   Oral   Take 1,000 mg by mouth 2 (two) times daily with a meal.           . omeprazole (PRILOSEC) 40 MG capsule   Oral   Take 1 capsule (40 mg total) by mouth daily.   30 capsule   3   . simvastatin (ZOCOR) 10 MG tablet   Oral   Take 10 mg by mouth at bedtime.          . ALPRAZolam (XANAX) 0.25 MG tablet   Oral   Take 1 tablet (0.25 mg total) by mouth 3 (three) times daily as needed for sleep or anxiety.   30 tablet   0     BP 189/61  Pulse 69  Temp(Src) 98.9 F (37.2 C) (Oral)  Resp 20  SpO2 97%  Physical Exam  Nursing note and vitals reviewed. Constitutional: She appears well-developed and well-nourished. No distress.  HENT:  Head: Normocephalic and atraumatic.  Nose: Mucosal edema present. Epistaxis ( minimal fresh blood in right nare) is observed.  Mouth/Throat: Uvula is midline, oropharynx is clear and moist and mucous membranes are normal. No oropharyngeal exudate, posterior oropharyngeal edema or posterior oropharyngeal erythema.  Scant new blood in right anterior nostril.  No blood in posterior pharynx.   Eyes: Conjunctivae are normal. No scleral icterus.  Neck: Normal range of motion.  Cardiovascular: Normal rate, regular rhythm and normal heart sounds.   Pulmonary/Chest: Effort normal. No respiratory distress. She has wheezes ( right lower lobe). She has no rales. She exhibits no tenderness.  Abdominal: Soft. Bowel sounds are normal. She exhibits no distension and no mass. There is tenderness (suprapubic). There is no rebound and no guarding.  Musculoskeletal: Normal range of motion.  Neurological: She is alert.  Skin: Skin is warm and dry. She is not diaphoretic.    ED Course  Procedures (including critical care time)  Labs Reviewed  CBC - Abnormal; Notable for the  following:    Hemoglobin 11.4 (*)    RDW 26.8 (*)    All other components within normal limits  BASIC METABOLIC PANEL - Abnormal; Notable for the following:    GFR calc non Af Amer 86 (*)  All other components within normal limits  URINALYSIS, ROUTINE W REFLEX MICROSCOPIC - Abnormal; Notable for the following:    Leukocytes, UA LARGE (*)    All other components within normal limits  GLUCOSE, CAPILLARY - Abnormal; Notable for the following:    Glucose-Capillary 104 (*)    All other components within normal limits  APTT  PROTIME-INR  URINE MICROSCOPIC-ADD ON   No results found.   1. Epistaxis       MDM  Pt reports blood transfusion of 2L blood, 1L iron 2 weeks ago for "severe anemia" noticed by PCP after occult blood, blood draws, followed by endoscopy and colonoscopy which showed several bleeding ulcers which have been treated.  Pt is not taking blood thinners and denies known hx of bleeding disorder.  Denies recent illness.  Obtaining CBC, BMP, PT, PTT, UA, and hemoccult.     Labs are consistent with pt's baseline. Discussed pt with Dr. Freida Busman who agreed to reevaluate pt.   Will discharge pt home with f/u with PCP.  Advised pt to return if bleeding return and is uncontrollable, or if pt becomes lethargic, notices blood in stool or emesis.   Pt reported recent increased anxiety to Dr. Freida Busman.  Will Rx: xanax.  F/u with Dr. Redmond School, PCP later this week. Vitals: unremarkable. Discharged in stable condition.    Discussed pt with attending during ED encounter.      Junius Finner, PA-C 11/28/12 1305

## 2012-11-28 NOTE — ED Notes (Signed)
Pt reports onset of nose bleed yesterday around noon. Was able to get bleeding controlled, did not get medical attention. Bleeding began again this morning, currently controlled. Pt states she had blood and iron transfusion 2 weeks ago

## 2012-12-01 ENCOUNTER — Telehealth: Payer: Self-pay | Admitting: *Deleted

## 2012-12-01 ENCOUNTER — Other Ambulatory Visit (HOSPITAL_BASED_OUTPATIENT_CLINIC_OR_DEPARTMENT_OTHER): Payer: Medicare Other | Admitting: Lab

## 2012-12-01 DIAGNOSIS — D509 Iron deficiency anemia, unspecified: Secondary | ICD-10-CM

## 2012-12-01 LAB — CBC WITH DIFFERENTIAL/PLATELET
BASO%: 1.1 % (ref 0.0–2.0)
Basophils Absolute: 0 10*3/uL (ref 0.0–0.1)
EOS%: 2.8 % (ref 0.0–7.0)
HGB: 11.6 g/dL (ref 11.6–15.9)
MCH: 28.1 pg (ref 25.1–34.0)
MCHC: 32 g/dL (ref 31.5–36.0)
MCV: 87.7 fL (ref 79.5–101.0)
MONO%: 9.3 % (ref 0.0–14.0)
RDW: 31.4 % — ABNORMAL HIGH (ref 11.2–14.5)
lymph#: 1.2 10*3/uL (ref 0.9–3.3)

## 2012-12-01 NOTE — CV Procedure (Addendum)
Small bowel enteroscopy -- initially the patient was scheduled for moderate sedation.  After informed consent, the patient was brought to the endoscopy suite and moderate sedation was administered in titrated fashion. The small bowel enteroscope was introduced through the mouth and advanced to the duodenum. The patient tolerated the procedure poorly due to discomfort and inadequate sedation. The decision was made to abort the procedure at that time without performing therapeutic intervention. The patient was recovered in the endoscopy suite, and was evaluated by anesthesia for monitored anesthesia care and repeat small bowel enteroscopy.  The small bowel enteroscopy report with monitored anesthesia care can be seen under the procedure tab

## 2012-12-01 NOTE — Telephone Encounter (Signed)
She can do it every 2 months now.  Continue oral iron.  Thanks.

## 2012-12-01 NOTE — Telephone Encounter (Signed)
Notified pt of labs every other month now per dr. Gaylyn Rong.   Keep next lab appt in 2 months on 8/06 at 8:15 am,  Will cancel all other lab appts.  She verbalized understanding.

## 2012-12-01 NOTE — Telephone Encounter (Signed)
Pt in lobby for lab only appt this morning.  Informed her of CBC wnl.  Pt asks if she still needs to come weekly for labs or can she come every other week?

## 2012-12-02 NOTE — ED Provider Notes (Signed)
Medical screening examination/treatment/procedure(s) were conducted as a shared visit with non-physician practitioner(s) and myself.  I personally evaluated the patient during the encounter  Flavius Repsher T Sahan Pen, MD 12/02/12 1530 

## 2012-12-03 ENCOUNTER — Encounter (HOSPITAL_COMMUNITY): Payer: Self-pay | Admitting: Internal Medicine

## 2012-12-08 ENCOUNTER — Other Ambulatory Visit: Payer: Medicare Other

## 2012-12-15 ENCOUNTER — Other Ambulatory Visit: Payer: Medicare Other

## 2012-12-20 ENCOUNTER — Encounter: Payer: Self-pay | Admitting: Internal Medicine

## 2012-12-21 ENCOUNTER — Encounter: Payer: Self-pay | Admitting: Internal Medicine

## 2012-12-21 ENCOUNTER — Ambulatory Visit (INDEPENDENT_AMBULATORY_CARE_PROVIDER_SITE_OTHER): Payer: Medicare Other | Admitting: Internal Medicine

## 2012-12-21 VITALS — BP 112/60 | HR 68 | Ht 64.0 in | Wt 108.8 lb

## 2012-12-21 DIAGNOSIS — D500 Iron deficiency anemia secondary to blood loss (chronic): Secondary | ICD-10-CM

## 2012-12-21 DIAGNOSIS — Z85038 Personal history of other malignant neoplasm of large intestine: Secondary | ICD-10-CM

## 2012-12-21 DIAGNOSIS — K31811 Angiodysplasia of stomach and duodenum with bleeding: Secondary | ICD-10-CM

## 2012-12-21 NOTE — Progress Notes (Signed)
Subjective:    Patient ID: Laura [name], female    DOB: May 18, 1935, 78 y.o.   MRN: [id number]  HPI Laura [name] is a 78 yo female with PMH of colon cancer diagnosed and treated in the 1999 at [facility] with what sounds like sigmoidectomy (no report of chemotherapy or radiation), breast cancer currently in remission (dx 2000), hypertension, diabetes, hypothyroidism, and iron deficiency anemia felt secondary to small bowel angiectasia who is seen in followup. She was initially seen to evaluate profound iron deficiency anemia and heme positive stool. She came for upper endoscopy and colonoscopy on 10/21/2012. Upper endoscopy revealed duodenal angiectasia. The esophagus and stomach were unremarkable. Colonoscopy revealed multiple subcentimeter polyps, ascending colon diverticulosis and a healthy-appearing colocolonic anastomosis.  The surgical pathology revealed 3 adenomatous polyps of the polyps were benign or hyperplastic.  She then came for small bowel enteroscopy on 11/25/2012. This revealed a 4 mm angiectasia in the gastric cardia treated successfully with a APC. It also found 3 other duodenal angiectasias also treated successfully with APC.  She has been followed by Dr. Gaylyn Rong and received IV iron. Blood counts have responded dramatically with improvement in hemoglobin to greater than 11. She did have a nosebleed about 4-5 days after her small bowel enteroscopy and she was seen in the ER. This abated without the need for nasal packing.  She reports she is feeling great and that she feels "like a new person". Her stools have returned to a now normal color. She is having a daily bowel movement without constipation or diarrhea. Appetite is still not great but she denies abdominal pain. No fevers or chills.  Review of Systems As per history of present illness, otherwise negative  Current Medications, Allergies, Past Medical History, Past Surgical History, Family History and Social History were reviewed in [employer] record.     Objective:   Physical Exam BP 112/60  Pulse 68  Ht 5\' 4"  (1.626 m)  Wt 108 lb 12.8 oz (49.351 kg)  BMI 18.67 kg/m2 Constitutional: Well-developed and well-nourished. No distress. HEENT: Normocephalic and atraumatic. Oropharynx is clear and moist. No oropharyngeal exudate. Conjunctivae are normal.  No scleral icterus. Neck: Neck supple. Trachea midline. Cardiovascular: Normal rate, regular rhythm and intact distal pulses. No M/R/G Pulmonary/chest: Effort normal and breath sounds normal. No wheezing, rales or rhonchi. Abdominal: Soft, nontender, nondistended. Bowel sounds active throughout. There are no masses palpable. No hepatosplenomegaly. Extremities: no clubbing, cyanosis, or edema Lymphadenopathy: No cervical adenopathy noted. Neurological: Alert and oriented to person place and time. Skin: Skin is warm and dry. No rashes noted. Psychiatric: Normal mood and affect. Behavior is normal.  CBC    Component Value Date/Time   WBC 4.1 12/01/2012 0825   WBC 4.1 11/28/2012 1149   RBC 4.14 12/01/2012 0825   RBC 4.19 11/28/2012 1149   HGB 11.6 12/01/2012 0825   HGB 11.4* 11/28/2012 1149   HCT 36.3 12/01/2012 0825   HCT 36.8 11/28/2012 1149   PLT 227 12/01/2012 0825   PLT 195 11/28/2012 1149   MCV 87.7 12/01/2012 0825   MCV 87.8 11/28/2012 1149   MCH 28.1 12/01/2012 0825   MCH 27.2 11/28/2012 1149   MCHC 32.0 12/01/2012 0825   MCHC 31.0 11/28/2012 1149   RDW 31.4* 12/01/2012 0825   RDW 26.8* 11/28/2012 1149   LYMPHSABS 1.2 12/01/2012 0825   LYMPHSABS 1.5 05/08/2011 1625   MONOABS 0.4 12/01/2012 0825   MONOABS 0.4 05/08/2011 1625   EOSABS 0.1 12/01/2012 0825  EOSABS 0.1 05/08/2011 1625   BASOSABS 0.0 12/01/2012 0825   BASOSABS 0.0 05/08/2011 1625       Assessment & Plan:   78 yo female with PMH of colon cancer diagnosed and treated in the 1999 at [facility] with what sounds like sigmoidectomy (no report of chemotherapy or radiation), breast cancer currently in remission (dx 2000),  hypertension, diabetes, hypothyroidism, and iron deficiency anemia felt secondary to small bowel angiectasia who is seen in followup  1.  Gastric and small bowel angioectasias/iron deficiency anemia -- status post APC ablation of 4 angiectasias.  She has responded extremely well to IV iron and is having no evidence of ongoing bleeding. Her hemoglobin is greater than 11 and has been stable. She will continue to follow with hematology with plans for repeat hemoglobin next month. Assuming that she is able to maintain her blood counts and iron stores and has no overt rebleeding, then nothing further is felt necessary at this time. I would like to see her back in 6 months, sooner if necessary. I've asked that if she developed melena or any other concerning symptoms that she notify my office and she voices understanding.  2.  History of colon cancer/history of adenomatous colon polyps -- I recommended repeating the colonoscopy in 3 years given that 3 adenomas were removed at her last colonoscopy. She would be due for repeat colonoscopy in April 2017.  She is aware of this recommendation

## 2012-12-21 NOTE — Patient Instructions (Addendum)
Follow up with Dr. Gaylyn Rong regarding blood work.  Follow up with Dr. Rhea Belton in office in 6 months: December 2014                                               We are excited to introduce MyChart, a new best-in-class service that provides you online access to important information in your electronic medical record. We want to make it easier for you to view your health information - all in one secure location - when and where you need it. We expect MyChart will enhance the quality of care and service we provide.  When you register for MyChart, you can:    View your test results.    Request appointments and receive appointment reminders via email.    Request medication renewals.    View your medical history, allergies, medications and immunizations.    Communicate with your physician's office through a password-protected site.    Conveniently print information such as your medication lists.  To find out if MyChart is right for you, please talk to a member of our clinical staff today. We will gladly answer your questions about this free health and wellness tool.  If you are age 10 or older and want a member of your family to have access to your record, you must provide written consent by completing a proxy form available at our office. Please speak to our clinical staff about guidelines regarding accounts for patients younger than age 68.  As you activate your MyChart account and need any technical assistance, please call the MyChart technical support line at [phone] [phone]) or email your question to [email or web address]. If you email your question(s), please include your name, a return phone number and the best time to reach you.  If you have non-urgent health-related questions, you can send a message to our office through MyChart at [email or web address]. If you have a medical emergency, call 911.  Thank you for using MyChart as your new health and wellness  resource!   MyChart licensed from [employer],  [id number]. Patents Pending.

## 2012-12-22 ENCOUNTER — Other Ambulatory Visit: Payer: Medicare Other

## 2012-12-29 ENCOUNTER — Other Ambulatory Visit: Payer: Medicare Other

## 2013-01-05 ENCOUNTER — Other Ambulatory Visit: Payer: Medicare Other

## 2013-01-12 ENCOUNTER — Other Ambulatory Visit: Payer: Medicare Other

## 2013-01-19 ENCOUNTER — Other Ambulatory Visit: Payer: Medicare Other

## 2013-01-26 ENCOUNTER — Other Ambulatory Visit: Payer: Medicare Other

## 2013-01-31 ENCOUNTER — Telehealth: Payer: Self-pay | Admitting: Oncology

## 2013-01-31 NOTE — Telephone Encounter (Signed)
lvm for pt at the number requested [phone]....confirm upcomming all appt per pt vm request...done.

## 2013-02-02 ENCOUNTER — Other Ambulatory Visit (HOSPITAL_BASED_OUTPATIENT_CLINIC_OR_DEPARTMENT_OTHER): Payer: Medicare Other

## 2013-02-02 DIAGNOSIS — D509 Iron deficiency anemia, unspecified: Secondary | ICD-10-CM

## 2013-02-02 LAB — CBC WITH DIFFERENTIAL/PLATELET
EOS%: 4.2 % (ref 0.0–7.0)
Eosinophils Absolute: 0.2 10*3/uL (ref 0.0–0.5)
HGB: 10.3 g/dL — ABNORMAL LOW (ref 11.6–15.9)
MCH: 33.8 pg (ref 25.1–34.0)
MCV: 96.9 fL (ref 79.5–101.0)
MONO%: 11.2 % (ref 0.0–14.0)
NEUT#: 2.6 10*3/uL (ref 1.5–6.5)
RBC: 3.06 10*6/uL — ABNORMAL LOW (ref 3.70–5.45)
RDW: 16.2 % — ABNORMAL HIGH (ref 11.2–14.5)
lymph#: 0.6 10*3/uL — ABNORMAL LOW (ref 0.9–3.3)

## 2013-02-04 ENCOUNTER — Telehealth: Payer: Self-pay

## 2013-02-04 NOTE — Telephone Encounter (Signed)
Message copied by Kallie Locks on Fri Feb 04, 2013 10:09 AM ------      Message from: Clenton Pare R      Created: Wed Feb 02, 2013 12:35 PM       Please call pt. Hgb is 10.3. No blood transfusion is indicated at this time. Keep follow-up as scheduled later this month. ------

## 2013-02-09 ENCOUNTER — Other Ambulatory Visit: Payer: Medicare Other

## 2013-02-16 ENCOUNTER — Telehealth: Payer: Self-pay | Admitting: Hematology and Oncology

## 2013-02-16 ENCOUNTER — Ambulatory Visit (HOSPITAL_BASED_OUTPATIENT_CLINIC_OR_DEPARTMENT_OTHER): Payer: Medicare Other | Admitting: Hematology and Oncology

## 2013-02-16 ENCOUNTER — Other Ambulatory Visit (HOSPITAL_BASED_OUTPATIENT_CLINIC_OR_DEPARTMENT_OTHER): Payer: Medicare Other | Admitting: Lab

## 2013-02-16 VITALS — BP 98/56 | HR 98 | Temp 97.6°F | Resp 18 | Ht 64.0 in | Wt 104.3 lb

## 2013-02-16 DIAGNOSIS — D500 Iron deficiency anemia secondary to blood loss (chronic): Secondary | ICD-10-CM

## 2013-02-16 DIAGNOSIS — D509 Iron deficiency anemia, unspecified: Secondary | ICD-10-CM

## 2013-02-16 DIAGNOSIS — R5381 Other malaise: Secondary | ICD-10-CM

## 2013-02-16 DIAGNOSIS — Z85038 Personal history of other malignant neoplasm of large intestine: Secondary | ICD-10-CM

## 2013-02-16 LAB — CBC & DIFF AND RETIC
Basophils Absolute: 0 10*3/uL (ref 0.0–0.1)
Eosinophils Absolute: 0.3 10*3/uL (ref 0.0–0.5)
HGB: 9.8 g/dL — ABNORMAL LOW (ref 11.6–15.9)
Immature Retic Fract: 14.1 % — ABNORMAL HIGH (ref 1.60–10.00)
MONO#: 0.6 10*3/uL (ref 0.1–0.9)
NEUT#: 3.8 10*3/uL (ref 1.5–6.5)
RBC: 3.19 10*6/uL — ABNORMAL LOW (ref 3.70–5.45)
RDW: 15.6 % — ABNORMAL HIGH (ref 11.2–14.5)
Retic %: 2.25 % — ABNORMAL HIGH (ref 0.70–2.10)
Retic Ct Abs: 71.78 10*3/uL (ref 33.70–90.70)
WBC: 5.6 10*3/uL (ref 3.9–10.3)
nRBC: 0 % (ref 0–0)

## 2013-02-16 LAB — COMPREHENSIVE METABOLIC PANEL (CC13)
ALT: 10 U/L (ref 0–55)
AST: 16 U/L (ref 5–34)
Alkaline Phosphatase: 101 U/L (ref 40–150)
Creatinine: 0.7 mg/dL (ref 0.6–1.1)
Total Bilirubin: 0.2 mg/dL (ref 0.20–1.20)

## 2013-02-16 LAB — IRON AND TIBC CHCC: Iron: 33 ug/dL — ABNORMAL LOW (ref 41–142)

## 2013-02-16 LAB — FERRITIN CHCC: Ferritin: 57 ng/ml (ref 9–269)

## 2013-02-16 LAB — LACTATE DEHYDROGENASE (CC13): LDH: 170 U/L (ref 125–245)

## 2013-02-16 MED ORDER — FERROUS FUMARATE 324 MG PO TABS: 1.0000 | ORAL_TABLET | Freq: Two times a day (BID) | ORAL | Status: DC

## 2013-02-16 NOTE — Telephone Encounter (Signed)
gv and printed appt sched and avs for pt  °

## 2013-03-06 NOTE — Progress Notes (Signed)
ID: Rocky Link OB: 10-30-1934  MR#: [id number]  CSN#:[id number]  [facility]  [phone] [phone]   OFFICE PROGRESS NOTE  PCP: Laura Jenny, MD GYN:   SU:  OTHER MD: DIAGNOSIS: Iron deficiency anemia:  HISTORY OF PRESENT ILLNESS: [name] [name] is a 78 year-old woman. She had normal Hgb in 2013. She has been having fatigue for 1 year. She was found to have severe anemia. She underwent EGD and colonoscopy that were both negative. She was kindly referred to the [facility] for evaluation and was seen by Dr. Gaylyn Rong first time on 11/17/12. Patient was diagnosed with iron deficiency anemia: Positive fecal occult blood. Negative EGD and colonoscopy per GI. Ii was deferred to GI to see if capsule endoscopy is indicated. She had symptomatic anemia with Hgb of 6 and received pRBC transfusion. 2- She has severe iron deficiency and IV iron Feraheme 1,020 mg IV x 1 was given.   Her daughter with BCR2 positive breast cancer; patient with history of breast and colon cancer because of that  Ms.  was recommended to see a Dentist for genetic testing. For her pelvic pain patient was advised to follow up with PCP and Gyn for bimanual exam.   INTERVAL HISTORY: The patient is a 78 y.o. female who presented for follow up visit. She feels tired, has no energy.She quit smoking last December after 50 years of smoking. Patient does not have good appetite. She lost some weight.She has daily headaches.. Patient complains on dyspnea,nausea, constipation., pain in arms, back, shoulders, legs.. Occasionally she is dizzy. She has numbness in legs.The patient denied fever, chills, night sweats. She denied  double vision, blurry vision, nasal congestion, nasal discharge, hearing problems, odynophagia or dysphagia. No chest pain, palpitations, cough, abdominal pain, vomiting, diarrhea, hematochezia. The patient denied dysuria, nocturia, polyuria, hematuria, myalgia,  tingling.  Review of Systems  Constitutional: Positive for weight loss and malaise/fatigue. Negative for fever, chills and diaphoresis.  HENT: Negative for hearing loss, ear pain, nosebleeds, congestion, sore throat, neck pain and tinnitus.   Eyes: Negative for blurred vision, double vision and photophobia.  Respiratory: Positive for shortness of breath. Negative for cough, hemoptysis, sputum production and wheezing.   Cardiovascular: Negative for chest pain, palpitations, orthopnea and claudication.  Gastrointestinal: Positive for nausea and constipation. Negative for heartburn, vomiting, abdominal pain, diarrhea, blood in stool and melena.  Genitourinary: Negative for dysuria, urgency, frequency, hematuria and flank pain.  Musculoskeletal: Positive for back pain and joint pain. Negative for myalgias.  Skin: Negative for itching and rash.  Neurological: Positive for dizziness, sensory change, weakness and headaches. Negative for tingling, tremors, speech change, focal weakness, seizures and loss of consciousness.  Endo/Heme/Allergies: Does not bruise/bleed easily.  Psychiatric/Behavioral: The patient is nervous/anxious.     PAST MEDICAL HISTORY: Past Medical History  Diagnosis Date  . Type II diabetes mellitus     neuropathy  . Hypercholesteremia   . Hypertension   . Chronic obstructive bronchitis   . Anemia   . Hypothyroidism   . Idiopathic peripheral neuropathy   . Muscle weakness (generalized)   . Myofacial muscle pain   . Depression   . Renal colic   . Hx of vaginal bleeding   . Hx of colonic polyps   . Iron deficiency anemia secondary to blood loss (chronic) 11/17/2012  . Iron deficiency anemia secondary to blood loss (chronic) 11/17/2012  . Breast cancer 2001    bilatera cancer; bilateral mastectomy; no chemo; 5  years of adjuvant hormonal therapy.  Was at [facility], [place].   . Cancer of colon 1999    UVA again, not sure stage I or II, no chemo recommended.     PAST  SURGICAL HISTORY: Past Surgical History  Procedure Laterality Date  . Partial colectomy    . Mastectomy      double  . Tonsillectomy    . Colon surgery    . Hernia repair      umblical hernia  . Enteroscopy N/A 11/25/2012    Procedure: ENTEROSCOPY;  Surgeon: Beverley Fiedler, MD;  Location: [facility] ENDOSCOPY;  Service: Gastroenterology;  Laterality: N/A;  with apc    FAMILY HISTORY Family History  Problem Relation Age of Onset  . Breast cancer Maternal Aunt   . Breast cancer Daughter   . Lung cancer Brother     X2 died from  . Cancer Brother     cancer  . Lung cancer Sister     died from  . Cancer Mother 8    colon cancer    HEALTH MAINTENANCE: History  Substance Use Topics  . Smoking status: Former Smoker -- 0.50 packs/day for 50 years    Types: Cigarettes    Quit date: 06/27/2012  . Smokeless tobacco: Never Used  . Alcohol Use: No     Allergies  Allergen Reactions  . Diclofenac Nausea And Vomiting    Current Outpatient Prescriptions  Medication Sig Dispense Refill  . ALPRAZolam (XANAX) 0.25 MG tablet Take 1 tablet (0.25 mg total) by mouth 3 (three) times daily as needed for sleep or anxiety.  30 tablet  0  . Fluticasone-Salmeterol (ADVAIR) 100-50 MCG/DOSE AEPB Inhale 1 puff into the lungs every 12 (twelve) hours.        Marland Kitchen levalbuterol (XOPENEX) 1.25 MG/0.5ML nebulizer solution Take 1 ampule by nebulization every 4 (four) hours as needed for wheezing.      Marland Kitchen levothyroxine (SYNTHROID, LEVOTHROID) 50 MCG tablet Take 50 mcg by mouth daily.        . metFORMIN (GLUCOPHAGE) 1000 MG tablet Take 1,000 mg by mouth 2 (two) times daily with a meal.        . simvastatin (ZOCOR) 10 MG tablet Take 10 mg by mouth at bedtime.       . Ferrous Fumarate 324 MG TABS Take 1 tablet (106 mg of iron total) by mouth 2 (two) times daily.  30 each  3   No current facility-administered medications for this visit.    OBJECTIVE: Filed Vitals:   02/16/13 0915  BP: 98/56  Pulse: 98  Temp: 97.6  F (36.4 C)  Resp: 18     Body mass index is 17.89 kg/(m^2).    PHYSICAL EXAMINATION:  HEENT: Sclerae anicteric.  Conjunctivae were pink. Pupils round and reactive bilaterally. Oral mucosa is moist without ulceration or thrush. No occipital, submandibular, cervical, supraclavicular or axillar adenopathy. Diminished hearing b/l. Lungs: clear to auscultation without wheezes. No rales or rhonchi. Heart: regular rate and rhythm. No murmur, gallop or rubs. Abdomen: soft, non tender. No guarding or rebound tenderness. Bowel sounds are present. No palpable hepatosplenomegaly. MSK: no focal spinal tenderness. Extremities: No clubbing or cyanosis.No calf tenderness to palpitation, no peripheral edema. The patient had grossly intact strength in upper and lower extremities. Skin exam was without ecchymosis, petechiae. Neuro: non-focal, alert and oriented to time, person and place, appropriate affect  LAB RESULTS:  CMP     Component Value Date/Time   NA 141 02/16/2013 0901  NA 142 11/28/2012 1149   K 4.0 02/16/2013 0901   K 3.9 11/28/2012 1149   CL 108 11/28/2012 1149   CL 109* 11/24/2012 0816   CO2 25 02/16/2013 0901   CO2 25 11/28/2012 1149   GLUCOSE 118 02/16/2013 0901   GLUCOSE 99 11/28/2012 1149   GLUCOSE 102* 11/24/2012 0816   BUN 9.0 02/16/2013 0901   BUN 11 11/28/2012 1149   CREATININE 0.7 02/16/2013 0901   CREATININE 0.63 11/28/2012 1149   CALCIUM 9.5 02/16/2013 0901   CALCIUM 9.6 11/28/2012 1149   PROT 7.4 02/16/2013 0901   ALBUMIN 3.4* 02/16/2013 0901   AST 16 02/16/2013 0901   ALT 10 02/16/2013 0901   ALKPHOS 101 02/16/2013 0901   BILITOT 0.20 02/16/2013 0901   GFRNONAA 86* 11/28/2012 1149   GFRAA >90 11/28/2012 1149    Lab Results  Component Value Date   WBC 5.6 02/16/2013   NEUTROABS 3.8 02/16/2013   HGB 9.8* 02/16/2013   HCT 30.6* 02/16/2013   MCV 95.9 02/16/2013   PLT 364 02/16/2013      Chemistry      Component Value Date/Time   NA 141 02/16/2013 0901   NA 142 11/28/2012 1149   K 4.0  02/16/2013 0901   K 3.9 11/28/2012 1149   CL 108 11/28/2012 1149   CL 109* 11/24/2012 0816   CO2 25 02/16/2013 0901   CO2 25 11/28/2012 1149   BUN 9.0 02/16/2013 0901   BUN 11 11/28/2012 1149   CREATININE 0.7 02/16/2013 0901   CREATININE 0.63 11/28/2012 1149      Component Value Date/Time   CALCIUM 9.5 02/16/2013 0901   CALCIUM 9.6 11/28/2012 1149   ALKPHOS 101 02/16/2013 0901   AST 16 02/16/2013 0901   ALT 10 02/16/2013 0901   BILITOT 0.20 02/16/2013 0901       STUDIES: No results found.  ASSESSMENT AND PLAN: 1. Anemia. Most likely this is combination of iron deficiency anemia and anemia of chronic disease. I discussed with patient trail of ferrous fumarate p.o. Bid and patient willing to try. During extensive GI work up angiectasias was found. Probably this is cause for iron deficiency.  I remind patient that she need follow up with her PCP for regular recommended cancer screening. She needs to follow her PCP for work up for fatigue which can include work up for hypothyroidism, adrenal insufficiency, vitamin D deficiency. We will follow patient in 3 month with CBC monthly.  Myra Rude, MD   03/06/2013 3:34 PM

## 2013-03-15 ENCOUNTER — Other Ambulatory Visit: Payer: Medicare Other | Admitting: Lab

## 2013-03-16 ENCOUNTER — Telehealth: Payer: Self-pay | Admitting: Hematology and Oncology

## 2013-03-16 ENCOUNTER — Encounter: Payer: Self-pay | Admitting: Internal Medicine

## 2013-03-16 NOTE — Telephone Encounter (Signed)
Pt called and r/s lab to tomorrow °

## 2013-03-17 ENCOUNTER — Other Ambulatory Visit: Payer: Self-pay | Admitting: *Deleted

## 2013-03-17 ENCOUNTER — Other Ambulatory Visit (HOSPITAL_BASED_OUTPATIENT_CLINIC_OR_DEPARTMENT_OTHER): Payer: Medicare Other | Admitting: Lab

## 2013-03-17 DIAGNOSIS — D500 Iron deficiency anemia secondary to blood loss (chronic): Secondary | ICD-10-CM

## 2013-03-17 LAB — CBC WITH DIFFERENTIAL/PLATELET
BASO%: 0.2 % (ref 0.0–2.0)
Basophils Absolute: 0 10*3/uL (ref 0.0–0.1)
HCT: 32.5 % — ABNORMAL LOW (ref 34.8–46.6)
HGB: 10.3 g/dL — ABNORMAL LOW (ref 11.6–15.9)
LYMPH%: 18.1 % (ref 14.0–49.7)
MCHC: 31.7 g/dL (ref 31.5–36.0)
MONO#: 0.5 10*3/uL (ref 0.1–0.9)
NEUT%: 71.3 % (ref 38.4–76.8)
Platelets: 288 10*3/uL (ref 145–400)
WBC: 5.8 10*3/uL (ref 3.9–10.3)

## 2013-04-12 ENCOUNTER — Other Ambulatory Visit: Payer: Medicare Other

## 2013-04-13 ENCOUNTER — Telehealth: Payer: Self-pay | Admitting: Hematology and Oncology

## 2013-04-13 NOTE — Telephone Encounter (Signed)
Returned pt's call re r/s 10/14 lb appt and due to having eye surgery and going back and forth to different doctors pt would like to cx lb altogether and just come back as scheduled for lb/fu 11/11. appt for 11/11 moved from CP2 to NG and pt given new time for lb/NG 11/11 @ 2:30pm

## 2013-04-26 ENCOUNTER — Other Ambulatory Visit: Payer: Medicare Other

## 2013-05-10 ENCOUNTER — Telehealth: Payer: Self-pay | Admitting: Hematology and Oncology

## 2013-05-10 ENCOUNTER — Telehealth: Payer: Self-pay | Admitting: *Deleted

## 2013-05-10 ENCOUNTER — Other Ambulatory Visit (HOSPITAL_BASED_OUTPATIENT_CLINIC_OR_DEPARTMENT_OTHER): Payer: Medicare Other | Admitting: Lab

## 2013-05-10 ENCOUNTER — Ambulatory Visit (HOSPITAL_BASED_OUTPATIENT_CLINIC_OR_DEPARTMENT_OTHER): Payer: Medicare Other | Admitting: Hematology and Oncology

## 2013-05-10 VITALS — BP 112/52 | HR 95 | Temp 97.7°F | Resp 18 | Ht 64.0 in | Wt 100.8 lb

## 2013-05-10 DIAGNOSIS — Z1509 Genetic susceptibility to other malignant neoplasm: Secondary | ICD-10-CM

## 2013-05-10 DIAGNOSIS — D500 Iron deficiency anemia secondary to blood loss (chronic): Secondary | ICD-10-CM

## 2013-05-10 DIAGNOSIS — K922 Gastrointestinal hemorrhage, unspecified: Secondary | ICD-10-CM

## 2013-05-10 LAB — CBC WITH DIFFERENTIAL/PLATELET
BASO%: 0.7 % (ref 0.0–2.0)
Basophils Absolute: 0 10*3/uL (ref 0.0–0.1)
EOS%: 2 % (ref 0.0–7.0)
Eosinophils Absolute: 0.1 10*3/uL (ref 0.0–0.5)
HCT: 26.7 % — ABNORMAL LOW (ref 34.8–46.6)
HGB: 8.3 g/dL — ABNORMAL LOW (ref 11.6–15.9)
LYMPH%: 24.1 % (ref 14.0–49.7)
MCH: 27.9 pg (ref 25.1–34.0)
MCHC: 31.2 g/dL — ABNORMAL LOW (ref 31.5–36.0)
MCV: 89.2 fL (ref 79.5–101.0)
MONO#: 0.6 10*3/uL (ref 0.1–0.9)
MONO%: 10.5 % (ref 0.0–14.0)
NEUT#: 3.3 10*3/uL (ref 1.5–6.5)
NEUT%: 62.7 % (ref 38.4–76.8)
Platelets: 359 10*3/uL (ref 145–400)
RBC: 2.99 10*6/uL — ABNORMAL LOW (ref 3.70–5.45)
RDW: 15.9 % — ABNORMAL HIGH (ref 11.2–14.5)
WBC: 5.3 10*3/uL (ref 3.9–10.3)
lymph#: 1.3 10*3/uL (ref 0.9–3.3)

## 2013-05-10 NOTE — Progress Notes (Signed)
[facility] OFFICE PROGRESS NOTE  TRIPP, Sherilyn Cooter, MD DIAGNOSIS:  Recurrent iron deficiency anemia secondary to chronic GI bleed  SUMMARY OF HEMATOLOGIC HISTORY: This is a pleasant 78 year old lady with background history of chronic GI bleed. She had EGD and colonoscopy which show activities. In May of 2014, she received 1 dose of intravenous iron infusion with improvement of her hemoglobin. INTERVAL HISTORY: Laura [name] 78 y.o. female returns for further followup. The patient had fatigue. The patient denies any recent signs or symptoms of bleeding such as spontaneous epistaxis, hematuria or hematochezia. She denies any muscle cramps.  I have reviewed the past medical history, past surgical history, social history and family history with the patient and they are unchanged from previous note.  ALLERGIES:  is allergic to diclofenac.  MEDICATIONS:  Current Outpatient Prescriptions  Medication Sig Dispense Refill  . ALPRAZolam (XANAX) 0.25 MG tablet Take 1 tablet (0.25 mg total) by mouth 3 (three) times daily as needed for sleep or anxiety.  30 tablet  0  . Fluticasone-Salmeterol (ADVAIR) 100-50 MCG/DOSE AEPB Inhale 1 puff into the lungs every 12 (twelve) hours.        . gabapentin (NEURONTIN) 100 MG capsule Take 100 mg by mouth 2 (two) times daily.      Marland Kitchen levalbuterol (XOPENEX) 1.25 MG/0.5ML nebulizer solution Take 1 ampule by nebulization every 4 (four) hours as needed for wheezing.      Marland Kitchen levothyroxine (SYNTHROID, LEVOTHROID) 50 MCG tablet Take 50 mcg by mouth daily.        . metFORMIN (GLUCOPHAGE) 1000 MG tablet Take 1,000 mg by mouth 2 (two) times daily with a meal.        . simvastatin (ZOCOR) 10 MG tablet Take 10 mg by mouth at bedtime.       . Ferrous Fumarate 324 MG TABS Take 1 tablet (106 mg of iron total) by mouth 2 (two) times daily.  30 each  3   No current facility-administered medications for this visit.     REVIEW OF SYSTEMS:   Constitutional: Denies  fevers, chills or night sweats Eyes: Denies blurriness of vision Ears, nose, mouth, throat, and face: Denies mucositis or sore throat Respiratory: Denies cough, dyspnea or wheezes Cardiovascular: Denies palpitation, chest discomfort or lower extremity swelling Gastrointestinal:  Denies nausea, heartburn or change in bowel habits Skin: Denies abnormal skin rashes Lymphatics: Denies new lymphadenopathy or easy bruising Neurological:Denies numbness, tingling or new weaknesses Behavioral/Psych: Mood is stable, no new changes  All other systems were reviewed with the patient and are negative.  PHYSICAL EXAMINATION: ECOG PERFORMANCE STATUS: 1 - Symptomatic but completely ambulatory  Filed Vitals:   05/10/13 1447  BP: 112/52  Pulse: 95  Temp: 97.7 F (36.5 C)  Resp: 18   Filed Weights   05/10/13 1447  Weight: 100 lb 12.8 oz (45.723 kg)    GENERAL:alert, no distress and comfortable. Patient looks pale SKIN: skin color, texture, turgor are normal, no rashes or significant lesions EYES: normal, Conjunctiva are pale and non-injected, sclera clear OROPHARYNX:no exudate, no erythema and lips, buccal mucosa, and tongue normal  NECK: supple, thyroid normal size, non-tender, without nodularity LYMPH:  no palpable lymphadenopathy in the cervical, axillary or inguinal LUNGS: clear to auscultation and percussion with normal breathing effort HEART: regular rate & rhythm and no murmurs and no lower extremity edema ABDOMEN:abdomen soft, non-tender and normal bowel sounds Musculoskeletal:no cyanosis of digits and no clubbing  NEURO: alert & oriented x 3 with fluent speech, no  focal motor/sensory deficits  LABORATORY DATA:  I have reviewed the data as listed Results for orders placed in visit on 05/10/13 (from the past 48 hour(s))  CBC WITH DIFFERENTIAL     Status: Abnormal   Collection Time    05/10/13  2:29 PM      Result Value Range   WBC 5.3  3.9 - 10.3 10e3/uL   NEUT# 3.3  1.5 - 6.5  10e3/uL   HGB 8.3 (*) 11.6 - 15.9 g/dL   HCT 16.1 (*) 09.6 - 04.5 %   Platelets 359  145 - 400 10e3/uL   MCV 89.2  79.5 - 101.0 fL   MCH 27.9  25.1 - 34.0 pg   MCHC 31.2 (*) 31.5 - 36.0 g/dL   RBC 4.09 (*) 8.11 - 9.14 10e6/uL   RDW 15.9 (*) 11.2 - 14.5 %   lymph# 1.3  0.9 - 3.3 10e3/uL   MONO# 0.6  0.1 - 0.9 10e3/uL   Eosinophils Absolute 0.1  0.0 - 0.5 10e3/uL   Basophils Absolute 0.0  0.0 - 0.1 10e3/uL   NEUT% 62.7  38.4 - 76.8 %   LYMPH% 24.1  14.0 - 49.7 %   MONO% 10.5  0.0 - 14.0 %   EOS% 2.0  0.0 - 7.0 %   BASO% 0.7  0.0 - 2.0 %   ASSESSMENT:  Recurrent iron deficiency secondary to GI bleed  PLAN:  #1 recurrent iron deficiency anemia The patient chronic GI bleed. Her iron level has dropped significantly and she becomes anemic again. She denies any evidence of bleeding but I suspect her chronic slow GI bleed. Her previous EGD was normal. I recommend repeat intravenous iron infusion. I would hope to defer blood transfusion if all possible. We discussed some of the risks, benefits, and alternatives of intravenous iron infusions. The patient is symptomatic from anemia and the iron level is critically low. She tolerated oral iron supplement poorly and desires to achieved higher levels of iron faster for adequate hematopoesis. Some of the side-effects to be expected including risks of infusion reactions, phlebitis, headaches, nausea and fatigue.  The patient is willing to proceed. I recommend recommend checking iron level and hemoglobin in 3 weeks. I would like to again to have her ferritin level over 100 before we stop. #2 history of diabetes I informed the patient and her hemoglobin A1c will not be accurate due to chronic GI bleed. Her blood sugar testing at home were satisfactory #3 history of BRCA2 mutation She had multiple colon polyps removed this year. I recommend repeat colonoscopy in one year. All questions were answered. The patient knows to call the clinic with any  problems, questions or concerns. No barriers to learning was detected.  I spent 25 minutes counseling the patient face to face. The total time spent in the appointment was 40 minutes and more than 50% was on counseling.     Merrit Waugh, MD 05/10/2013 3:30 PM

## 2013-05-10 NOTE — Telephone Encounter (Signed)
worked 05/10/13 POF iron inf 11/12 per MW LVMM adv of Dec appts mailed cal and AVS shh

## 2013-05-10 NOTE — Telephone Encounter (Signed)
Per staff message and POF I have scheduled appts.  JMW  

## 2013-05-11 ENCOUNTER — Ambulatory Visit (HOSPITAL_BASED_OUTPATIENT_CLINIC_OR_DEPARTMENT_OTHER): Payer: Medicare Other

## 2013-05-11 DIAGNOSIS — D500 Iron deficiency anemia secondary to blood loss (chronic): Secondary | ICD-10-CM

## 2013-05-11 DIAGNOSIS — D509 Iron deficiency anemia, unspecified: Secondary | ICD-10-CM

## 2013-05-11 MED ORDER — HEPARIN SOD (PORK) LOCK FLUSH 100 UNIT/ML IV SOLN
250.0000 [IU] | Freq: Once | INTRAVENOUS | Status: DC | PRN
  Filled 2013-05-11: qty 5

## 2013-05-11 MED ORDER — SODIUM CHLORIDE 0.9 % IJ SOLN
10.0000 mL | INTRAMUSCULAR | Status: DC | PRN
  Filled 2013-05-11: qty 10

## 2013-05-11 MED ORDER — HEPARIN SOD (PORK) LOCK FLUSH 100 UNIT/ML IV SOLN
500.0000 [IU] | Freq: Once | INTRAVENOUS | Status: DC | PRN
  Filled 2013-05-11: qty 5

## 2013-05-11 MED ORDER — ACETAMINOPHEN 325 MG PO TABS: 650.0000 mg | ORAL_TABLET | Freq: Once | ORAL | Status: DC

## 2013-05-11 MED ORDER — DIPHENHYDRAMINE HCL 25 MG PO CAPS
ORAL_CAPSULE | ORAL | Status: AC
  Filled 2013-05-11: qty 2

## 2013-05-11 MED ORDER — DIPHENHYDRAMINE HCL 25 MG PO CAPS: 50.0000 mg | ORAL_CAPSULE | Freq: Once | ORAL | Status: DC

## 2013-05-11 MED ORDER — ACETAMINOPHEN 325 MG PO TABS
650.0000 mg | ORAL_TABLET | Freq: Once | ORAL | Status: AC
  Administered 2013-05-11: 650 mg via ORAL

## 2013-05-11 MED ORDER — DIPHENHYDRAMINE HCL 25 MG PO CAPS
50.0000 mg | ORAL_CAPSULE | Freq: Once | ORAL | Status: AC
Start: 2013-05-11 — End: 2013-05-11
  Administered 2013-05-11: 50 mg via ORAL

## 2013-05-11 MED ORDER — ACETAMINOPHEN 325 MG PO TABS
ORAL_TABLET | ORAL | Status: AC
  Filled 2013-05-11: qty 2

## 2013-05-11 MED ORDER — SODIUM CHLORIDE 0.9 % IJ SOLN
3.0000 mL | Freq: Once | INTRAMUSCULAR | Status: DC | PRN
  Filled 2013-05-11: qty 10

## 2013-05-11 MED ORDER — ALTEPLASE 2 MG IJ SOLR
2.0000 mg | Freq: Once | INTRAMUSCULAR | Status: DC | PRN
  Filled 2013-05-11: qty 2

## 2013-05-11 MED ORDER — SODIUM CHLORIDE 0.9 % IV SOLN
1020.0000 mg | Freq: Once | INTRAVENOUS | Status: AC
  Administered 2013-05-11: 1020 mg via INTRAVENOUS
  Filled 2013-05-11: qty 34

## 2013-05-11 NOTE — Progress Notes (Signed)
Got up from chair  to walk out to lobby and reported feeling dizzy. Pt assisted back to chair. BP 126/61 pulse81 . Assisted pt back to bed to sit and dizziness resolved. Pt discharged to lobby in wheelchair.

## 2013-05-11 NOTE — Patient Instructions (Signed)

## 2013-06-03 ENCOUNTER — Ambulatory Visit (HOSPITAL_BASED_OUTPATIENT_CLINIC_OR_DEPARTMENT_OTHER): Payer: Medicare Other | Admitting: Hematology and Oncology

## 2013-06-03 ENCOUNTER — Encounter: Payer: Self-pay | Admitting: Hematology and Oncology

## 2013-06-03 ENCOUNTER — Telehealth: Payer: Self-pay | Admitting: Hematology and Oncology

## 2013-06-03 ENCOUNTER — Ambulatory Visit: Payer: Medicare Other | Admitting: Lab

## 2013-06-03 ENCOUNTER — Other Ambulatory Visit (HOSPITAL_BASED_OUTPATIENT_CLINIC_OR_DEPARTMENT_OTHER): Payer: Medicare Other | Admitting: Lab

## 2013-06-03 VITALS — BP 106/46 | HR 87 | Temp 97.4°F | Resp 19 | Ht 64.0 in | Wt 100.6 lb

## 2013-06-03 DIAGNOSIS — K922 Gastrointestinal hemorrhage, unspecified: Secondary | ICD-10-CM

## 2013-06-03 DIAGNOSIS — D500 Iron deficiency anemia secondary to blood loss (chronic): Secondary | ICD-10-CM

## 2013-06-03 LAB — CBC & DIFF AND RETIC
BASO%: 0.8 % (ref 0.0–2.0)
EOS%: 1.9 % (ref 0.0–7.0)
HCT: 37.3 % (ref 34.8–46.6)
Immature Retic Fract: 1.7 % (ref 1.60–10.00)
LYMPH%: 22.6 % (ref 14.0–49.7)
MCH: 31.3 pg (ref 25.1–34.0)
MCHC: 32.3 g/dL (ref 31.5–36.0)
MCV: 97.1 fL (ref 79.5–101.0)
MONO#: 0.4 10*3/uL (ref 0.1–0.9)
MONO%: 7.3 % (ref 0.0–14.0)
NEUT#: 3.7 10*3/uL (ref 1.5–6.5)
NEUT%: 67.4 % (ref 38.4–76.8)
Platelets: 307 10*3/uL (ref 145–400)
Retic Ct Abs: 32.26 10*3/uL — ABNORMAL LOW (ref 33.70–90.70)
lymph#: 1.3 10*3/uL (ref 0.9–3.3)

## 2013-06-03 LAB — HOLD TUBE, BLOOD BANK

## 2013-06-03 LAB — IRON AND TIBC CHCC
%SAT: 18 % — ABNORMAL LOW (ref 21–57)
Iron: 59 ug/dL (ref 41–142)
TIBC: 321 ug/dL (ref 236–444)
UIBC: 262 ug/dL (ref 120–384)

## 2013-06-03 NOTE — Telephone Encounter (Signed)
worked 12/5 POF appts made MAR cal mailed to pt shh

## 2013-06-03 NOTE — Progress Notes (Signed)
[facility] OFF[facility]  TRIPP, Laura Cooter, MD DIAGNOSIS:  Iron deficiency anemia due to chronic GI bleed  SUMMARY OF HEMATOLOGIC HISTORY: This is a pleasant 78 year old lady with background history of chronic GI bleed. She had EGD and colonoscopy which show angioectasias. In May of 2014, she received 1 dose of intravenous iron infusion with improvement of her hemoglobin. INTERVAL HISTORY: Laura [name] 78 y.o. female returns for further followup. She had excellent energy level since the iron infusion. The patient denies any recent signs or symptoms of bleeding such as spontaneous epistaxis, hematuria or hematochezia.  I have reviewed the past medical history, past surgical history, social history and family history with the patient and they are unchanged from previous note.  ALLERGIES:  is allergic to diclofenac.  MEDICATIONS:  Current Outpatient Prescriptions  Medication Sig Dispense Refill  . ALPRAZolam (XANAX) 0.25 MG tablet Take 1 tablet (0.25 mg total) by mouth 3 (three) times daily as needed for sleep or anxiety.  30 tablet  0  . Fluticasone-Salmeterol (ADVAIR) 100-50 MCG/DOSE AEPB Inhale 1 puff into the lungs every 12 (twelve) hours.        . gabapentin (NEURONTIN) 100 MG capsule Take 100 mg by mouth 2 (two) times daily.      Marland Kitchen levalbuterol (XOPENEX) 1.25 MG/0.5ML nebulizer solution Take 1 ampule by nebulization every 4 (four) hours as needed for wheezing.      Marland Kitchen levothyroxine (SYNTHROID, LEVOTHROID) 50 MCG tablet Take 50 mcg by mouth daily.        . metFORMIN (GLUCOPHAGE) 1000 MG tablet Take 1,000 mg by mouth 2 (two) times daily with a meal.        . simvastatin (ZOCOR) 10 MG tablet Take 10 mg by mouth at bedtime.        No current facility-administered medications for this visit.     REVIEW OF SYSTEMS:   Constitutional: Denies fevers, chills or night sweats All other systems were reviewed with the patient and are negative.  PHYSICAL EXAMINATION: ECOG  PERFORMANCE STATUS: 0 - Asymptomatic  Filed Vitals:   06/03/13 1504  BP: 106/46  Pulse: 87  Temp: 97.4 F (36.3 C)  Resp: 19   Filed Weights   06/03/13 1504  Weight: 100 lb 9.6 oz (45.632 kg)    GENERAL:alert, no distress and comfortable SKIN: skin color, texture, turgor are normal, no rashes or significant lesions NEURO: alert & oriented x 3 with fluent speech, no focal motor/sensory deficits  LABORATORY DATA:  I have reviewed the data as listed Results for orders placed in visit on 06/03/13 (from the past 48 hour(s))  CBC & DIFF AND RETIC     Status: Abnormal   Collection Time    06/03/13  2:37 PM      Result Value Range   WBC 5.5  3.9 - 10.3 10e3/uL   NEUT# 3.7  1.5 - 6.5 10e3/uL   HGB 12.0  11.6 - 15.9 g/dL   HCT 29.5  62.1 - 30.8 %   Platelets 307  145 - 400 10e3/uL   MCV 97.1  79.5 - 101.0 fL   MCH 31.3  25.1 - 34.0 pg   MCHC 32.3  31.5 - 36.0 g/dL   RBC 6.57  8.46 - 9.62 10e6/uL   RDW 23.9 (*) 11.2 - 14.5 %   lymph# 1.3  0.9 - 3.3 10e3/uL   MONO# 0.4  0.1 - 0.9 10e3/uL   Eosinophils Absolute 0.1  0.0 - 0.5 10e3/uL  Basophils Absolute 0.0  0.0 - 0.1 10e3/uL   NEUT% 67.4  38.4 - 76.8 %   LYMPH% 22.6  14.0 - 49.7 %   MONO% 7.3  0.0 - 14.0 %   EOS% 1.9  0.0 - 7.0 %   BASO% 0.8  0.0 - 2.0 %   nRBC 0  0 - 0 %   Retic % 0.84  0.70 - 2.10 %   Retic Ct Abs 32.26 (*) 33.70 - 90.70 10e3/uL   Immature Retic Fract 1.70  1.60 - 10.00 %  FERRITIN [facility]     Status: Abnormal   Collection Time    06/03/13  2:37 PM      Result Value Range   Ferritin 295 (*) 9 - 269 ng/ml  IRON AND TIBC [facility]     Status: Abnormal   Collection Time    06/03/13  2:37 PM      Result Value Range   Iron 59  41 - 142 ug/dL   TIBC 161  096 - 045 ug/dL   UIBC 409  811 - 914 ug/dL   %SAT 18 (*) 21 - 57 %  HOLD TUBE, BLOOD BANK     Status: None   Collection Time    06/03/13  2:37 PM      Result Value Range   Hold Tube, Blood Bank Blood Bank Order Cancelled      Lab Results  Component  Value Date   WBC 5.5 06/03/2013   HGB 12.0 06/03/2013   HCT 37.3 06/03/2013   MCV 97.1 06/03/2013   PLT 307 06/03/2013    ASSESSMENT & PLAN:  #1 iron deficiency anemia, resolved #2 history of upper GI angioectasia We replenished her iron stores. Her ferritin level is over 200. Her anemia has resolved. The patient is still at risk of recurrent GI bleed due to abnormal EGD findings. I recommend repeat blood work and followup in 3 months. Educated the patient and family members signs and symptoms to watch out for recurrence of anemia. All questions were answered. The patient knows to call the clinic with any problems, questions or concerns. No barriers to learning was detected.  I spent 15 minutes counseling the patient face to face. The total time spent in the appointment was 20 minutes and more than 50% was on counseling.     [facility], Ahlijah Raia, MD 06/03/2013 4:38 PM

## 2013-07-15 DIAGNOSIS — H18419 Arcus senilis, unspecified eye: Secondary | ICD-10-CM | POA: Diagnosis not present

## 2013-07-15 DIAGNOSIS — Z961 Presence of intraocular lens: Secondary | ICD-10-CM | POA: Diagnosis not present

## 2013-07-15 DIAGNOSIS — H26499 Other secondary cataract, unspecified eye: Secondary | ICD-10-CM | POA: Diagnosis not present

## 2013-07-15 DIAGNOSIS — E119 Type 2 diabetes mellitus without complications: Secondary | ICD-10-CM | POA: Diagnosis not present

## 2013-07-27 DIAGNOSIS — G894 Chronic pain syndrome: Secondary | ICD-10-CM | POA: Diagnosis not present

## 2013-07-27 DIAGNOSIS — J449 Chronic obstructive pulmonary disease, unspecified: Secondary | ICD-10-CM | POA: Diagnosis not present

## 2013-07-27 DIAGNOSIS — D500 Iron deficiency anemia secondary to blood loss (chronic): Secondary | ICD-10-CM | POA: Diagnosis not present

## 2013-07-27 DIAGNOSIS — F17200 Nicotine dependence, unspecified, uncomplicated: Secondary | ICD-10-CM | POA: Diagnosis not present

## 2013-07-27 DIAGNOSIS — E1169 Type 2 diabetes mellitus with other specified complication: Secondary | ICD-10-CM | POA: Diagnosis not present

## 2013-07-27 DIAGNOSIS — H269 Unspecified cataract: Secondary | ICD-10-CM | POA: Diagnosis not present

## 2013-07-27 DIAGNOSIS — M542 Cervicalgia: Secondary | ICD-10-CM | POA: Diagnosis not present

## 2013-07-27 DIAGNOSIS — IMO0001 Reserved for inherently not codable concepts without codable children: Secondary | ICD-10-CM | POA: Diagnosis not present

## 2013-08-09 DIAGNOSIS — H26499 Other secondary cataract, unspecified eye: Secondary | ICD-10-CM | POA: Diagnosis not present

## 2013-08-09 DIAGNOSIS — Z961 Presence of intraocular lens: Secondary | ICD-10-CM | POA: Diagnosis not present

## 2013-08-30 DIAGNOSIS — H26499 Other secondary cataract, unspecified eye: Secondary | ICD-10-CM | POA: Diagnosis not present

## 2013-09-01 ENCOUNTER — Ambulatory Visit (HOSPITAL_BASED_OUTPATIENT_CLINIC_OR_DEPARTMENT_OTHER): Payer: Medicare Other

## 2013-09-01 ENCOUNTER — Other Ambulatory Visit (HOSPITAL_BASED_OUTPATIENT_CLINIC_OR_DEPARTMENT_OTHER): Payer: Medicare Other

## 2013-09-01 ENCOUNTER — Telehealth: Payer: Self-pay | Admitting: Hematology and Oncology

## 2013-09-01 ENCOUNTER — Ambulatory Visit (HOSPITAL_BASED_OUTPATIENT_CLINIC_OR_DEPARTMENT_OTHER): Payer: Medicare Other | Admitting: Hematology and Oncology

## 2013-09-01 ENCOUNTER — Encounter: Payer: Self-pay | Admitting: Hematology and Oncology

## 2013-09-01 VITALS — BP 143/51 | HR 112 | Temp 98.2°F | Resp 18 | Ht 64.0 in | Wt 100.6 lb

## 2013-09-01 DIAGNOSIS — Z85038 Personal history of other malignant neoplasm of large intestine: Secondary | ICD-10-CM | POA: Diagnosis not present

## 2013-09-01 DIAGNOSIS — Z853 Personal history of malignant neoplasm of breast: Secondary | ICD-10-CM | POA: Diagnosis not present

## 2013-09-01 DIAGNOSIS — E119 Type 2 diabetes mellitus without complications: Secondary | ICD-10-CM

## 2013-09-01 DIAGNOSIS — D649 Anemia, unspecified: Secondary | ICD-10-CM | POA: Diagnosis not present

## 2013-09-01 DIAGNOSIS — D509 Iron deficiency anemia, unspecified: Secondary | ICD-10-CM | POA: Diagnosis not present

## 2013-09-01 DIAGNOSIS — R42 Dizziness and giddiness: Secondary | ICD-10-CM

## 2013-09-01 DIAGNOSIS — D500 Iron deficiency anemia secondary to blood loss (chronic): Secondary | ICD-10-CM

## 2013-09-01 LAB — IRON AND TIBC CHCC
%SAT: 12 % — ABNORMAL LOW (ref 21–57)
IRON: 48 ug/dL (ref 41–142)
TIBC: 409 ug/dL (ref 236–444)
UIBC: 361 ug/dL (ref 120–384)

## 2013-09-01 LAB — CBC & DIFF AND RETIC
BASO%: 0.3 % (ref 0.0–2.0)
BASOS ABS: 0 10*3/uL (ref 0.0–0.1)
EOS%: 1.2 % (ref 0.0–7.0)
Eosinophils Absolute: 0.1 10*3/uL (ref 0.0–0.5)
HEMATOCRIT: 31.3 % — AB (ref 34.8–46.6)
HEMOGLOBIN: 9.9 g/dL — AB (ref 11.6–15.9)
Immature Retic Fract: 10.4 % — ABNORMAL HIGH (ref 1.60–10.00)
LYMPH%: 19.7 % (ref 14.0–49.7)
MCH: 31.2 pg (ref 25.1–34.0)
MCHC: 31.6 g/dL (ref 31.5–36.0)
MCV: 98.7 fL (ref 79.5–101.0)
MONO#: 0.4 10*3/uL (ref 0.1–0.9)
MONO%: 6.6 % (ref 0.0–14.0)
NEUT%: 72.2 % (ref 38.4–76.8)
NEUTROS ABS: 4.8 10*3/uL (ref 1.5–6.5)
Platelets: 302 10*3/uL (ref 145–400)
RBC: 3.17 10*6/uL — ABNORMAL LOW (ref 3.70–5.45)
RDW: 14.1 % (ref 11.2–14.5)
Retic %: 1.97 % (ref 0.70–2.10)
Retic Ct Abs: 62.45 10*3/uL (ref 33.70–90.70)
WBC: 6.7 10*3/uL (ref 3.9–10.3)
lymph#: 1.3 10*3/uL (ref 0.9–3.3)

## 2013-09-01 LAB — FERRITIN CHCC: Ferritin: 10 ng/ml (ref 9–269)

## 2013-09-01 MED ORDER — SODIUM CHLORIDE 0.9 % IV SOLN
1020.0000 mg | Freq: Once | INTRAVENOUS | Status: AC
  Administered 2013-09-01: 1020 mg via INTRAVENOUS
  Filled 2013-09-01: qty 34

## 2013-09-01 NOTE — Patient Instructions (Signed)

## 2013-09-01 NOTE — Telephone Encounter (Signed)
Gave pt apt for lab and Md for June 2015

## 2013-09-01 NOTE — Progress Notes (Signed)
[facility] OFFICE PROGRESS NOTE  Laura Poll, MD DIAGNOSIS:  Recurrent anemia, suspect GI bleed  SUMMARY OF HEMATOLOGIC HISTORY: This is a pleasant 79 year old lady with background history of chronic GI bleed. She had remote history of colon cancer diagnosed in 1999 status post resection. She also had remote history of breast cancer diagnosed in 2000, neither records were available. She had EGD and colonoscopy in 2014 which showed angioectasias and multiple benign polyps. In May of 2014 and November 2014, she received 1 dose of intravenous iron infusion with improvement of her hemoglobin. INTERVAL HISTORY: Laura [name] 79 y.o. female returns for further followup. She does not feel well. The patient underwent bilateral eye cataract surgery with complications. About a month ago, she has progressive fatigue and dizziness. The patient is diabetic and had her blood sugar checked recently which was low. Her medications were adjusted. The patient denies any recent signs or symptoms of bleeding such as spontaneous epistaxis, hematuria or hematochezia.  I have reviewed the past medical history, past surgical history, social history and family history with the patient and they are unchanged from previous note.  ALLERGIES:  is allergic to diclofenac.  MEDICATIONS:  Current Outpatient Prescriptions  Medication Sig Dispense Refill  . ALPRAZolam (XANAX) 0.25 MG tablet Take 1 tablet (0.25 mg total) by mouth 3 (three) times daily as needed for sleep or anxiety.  30 tablet  0  . Fluticasone-Salmeterol (ADVAIR) 100-50 MCG/DOSE AEPB Inhale 1 puff into the lungs every 12 (twelve) hours.        . gabapentin (NEURONTIN) 100 MG capsule Take 100 mg by mouth 2 (two) times daily.      Marland Kitchen levalbuterol (XOPENEX HFA) 45 MCG/ACT inhaler Inhale into the lungs every 4 (four) hours as needed for wheezing.      Marland Kitchen levothyroxine (SYNTHROID, LEVOTHROID) 50 MCG tablet Take 50 mcg by mouth daily.        .  metFORMIN (GLUCOPHAGE) 1000 MG tablet Take 250 mg by mouth 2 (two) times daily with a meal.       . simvastatin (ZOCOR) 10 MG tablet Take 10 mg by mouth at bedtime.        No current facility-administered medications for this visit.     REVIEW OF SYSTEMS:   Constitutional: Denies fevers, chills or night sweats Eyes: Denies blurriness of vision Ears, nose, mouth, throat, and face: Denies mucositis or sore throat Respiratory: Denies cough, dyspnea or wheezes Cardiovascular: Denies palpitation, chest discomfort or lower extremity swelling Gastrointestinal:  Denies nausea, heartburn or change in bowel habits Skin: Denies abnormal skin rashes Lymphatics: Denies new lymphadenopathy or easy bruising Neurological:Denies numbness, tingling or new weaknesses Behavioral/Psych: Mood is stable, no new changes  All other systems were reviewed with the patient and are negative.  PHYSICAL EXAMINATION: ECOG PERFORMANCE STATUS: 1 - Symptomatic but completely ambulatory  Filed Vitals:   09/01/13 1458  BP: 143/51  Pulse: 112  Temp: 98.2 F (36.8 C)  Resp: 18   Filed Weights   09/01/13 1458  Weight: 100 lb 9.6 oz (45.632 kg)    GENERAL:alert, no distress and comfortable SKIN: skin color, texture, turgor are normal, no rashes or significant lesions EYES: normal, Conjunctiva are pale and non-injected, sclera clear OROPHARYNX:no exudate, no erythema and lips, buccal mucosa, and tongue normal  NECK: supple, thyroid normal size, non-tender, without nodularity LYMPH:  no palpable lymphadenopathy in the cervical, axillary or inguinal LUNGS: clear to auscultation and percussion with normal breathing effort HEART: regular rate &  rhythm and no murmurs and no lower extremity edema ABDOMEN:abdomen soft, non-tender and normal bowel sounds Musculoskeletal:no cyanosis of digits and no clubbing  NEURO: alert & oriented x 3 with fluent speech, no focal motor/sensory deficits  LABORATORY DATA:  I have  reviewed the data as listed Results for orders placed in visit on 09/01/13 (from the past 48 hour(s))  CBC & DIFF AND RETIC     Status: Abnormal   Collection Time    09/01/13  2:35 PM      Result Value Ref Range   WBC 6.7  3.9 - 10.3 10e3/uL   NEUT# 4.8  1.5 - 6.5 10e3/uL   HGB 9.9 (*) 11.6 - 15.9 g/dL   HCT 31.3 (*) 34.8 - 46.6 %   Platelets 302  145 - 400 10e3/uL   MCV 98.7  79.5 - 101.0 fL   MCH 31.2  25.1 - 34.0 pg   MCHC 31.6  31.5 - 36.0 g/dL   RBC 3.17 (*) 3.70 - 5.45 10e6/uL   RDW 14.1  11.2 - 14.5 %   lymph# 1.3  0.9 - 3.3 10e3/uL   MONO# 0.4  0.1 - 0.9 10e3/uL   Eosinophils Absolute 0.1  0.0 - 0.5 10e3/uL   Basophils Absolute 0.0  0.0 - 0.1 10e3/uL   NEUT% 72.2  38.4 - 76.8 %   LYMPH% 19.7  14.0 - 49.7 %   MONO% 6.6  0.0 - 14.0 %   EOS% 1.2  0.0 - 7.0 %   BASO% 0.3  0.0 - 2.0 %   Retic % 1.97  0.70 - 2.10 %   Retic Ct Abs 62.45  33.70 - 90.70 10e3/uL   Immature Retic Fract 10.40 (*) 1.60 - 10.00 %    Lab Results  Component Value Date   WBC 6.7 09/01/2013   HGB 9.9* 09/01/2013   HCT 31.3* 09/01/2013   MCV 98.7 09/01/2013   PLT 302 09/01/2013   ASSESSMENT & PLAN:  #1 recurrent anemia, likely due to recurrent GI bleed until proven otherwise #2 history of upper GI angioectasia #3 history of colon cancer The most likely cause of her anemia is due to chronic blood loss. We discussed some of the risks, benefits, and alternatives of intravenous iron infusions. The patient is symptomatic from anemia and the iron level is critically low. She tolerated oral iron supplement poorly and desires to achieved higher levels of iron faster for adequate hematopoesis. Some of the side-effects to be expected including risks of infusion reactions, phlebitis, headaches, nausea and fatigue.  The patient is willing to proceed. Patient education material was dispensed.  Goal is to keep ferritin level greater than 50 I will proceed to alert her gastroenterologist for further evaluation. Due to her  history of colon cancer, I recommend repeat endoscopy evaluation. #4 history of breast cancer Recommend to continue screening mammograms. All questions were answered. The patient knows to call the clinic with any problems, questions or concerns. No barriers to learning was detected.  I spent 25 minutes counseling the patient face to face. The total time spent in the appointment was 40 minutes and more than 50% was on counseling.     [facility], [place], MD 09/01/2013 3:25 PM

## 2013-09-05 DIAGNOSIS — H18419 Arcus senilis, unspecified eye: Secondary | ICD-10-CM | POA: Diagnosis not present

## 2013-09-19 DIAGNOSIS — H16219 Exposure keratoconjunctivitis, unspecified eye: Secondary | ICD-10-CM | POA: Diagnosis not present

## 2013-09-19 DIAGNOSIS — Z961 Presence of intraocular lens: Secondary | ICD-10-CM | POA: Diagnosis not present

## 2013-09-19 DIAGNOSIS — H18419 Arcus senilis, unspecified eye: Secondary | ICD-10-CM | POA: Diagnosis not present

## 2013-09-19 DIAGNOSIS — G510 Bell's palsy: Secondary | ICD-10-CM | POA: Diagnosis not present

## 2013-10-05 DIAGNOSIS — H18419 Arcus senilis, unspecified eye: Secondary | ICD-10-CM | POA: Diagnosis not present

## 2013-10-05 DIAGNOSIS — G510 Bell's palsy: Secondary | ICD-10-CM | POA: Diagnosis not present

## 2013-10-05 DIAGNOSIS — Z961 Presence of intraocular lens: Secondary | ICD-10-CM | POA: Diagnosis not present

## 2013-10-12 ENCOUNTER — Telehealth: Payer: Self-pay

## 2013-10-12 NOTE — Telephone Encounter (Signed)
Message     Pt needs office followup with me or with APP (if needed more urgently, melena, obvious gi bleeding, etc.)    JMP        ----- Message -----    From: Heath Lark, MD    Sent: 09/01/2013 3:27 PM    To: Jerene Bears, MD        Dr Hilarie Fredrickson,        I am concerned about recurrent bleeding on this patient. She may need repeat colonoscopy.        Thanks        I contacted the patient.  She reluctantly agreed to schedule a follow up.  She denies any problems at this time.  No melena, bleeding, abdominal pain or other GI complaints.  She wants dr. Hilarie Fredrickson to know she will not do another colonoscopy, but is willing to come in and be seen at he and Dr. Bethanie Dicker request

## 2013-10-17 DIAGNOSIS — H18419 Arcus senilis, unspecified eye: Secondary | ICD-10-CM | POA: Diagnosis not present

## 2013-10-27 DIAGNOSIS — D500 Iron deficiency anemia secondary to blood loss (chronic): Secondary | ICD-10-CM | POA: Diagnosis not present

## 2013-10-27 DIAGNOSIS — M6281 Muscle weakness (generalized): Secondary | ICD-10-CM | POA: Diagnosis not present

## 2013-10-27 DIAGNOSIS — E785 Hyperlipidemia, unspecified: Secondary | ICD-10-CM | POA: Diagnosis not present

## 2013-10-27 DIAGNOSIS — F17200 Nicotine dependence, unspecified, uncomplicated: Secondary | ICD-10-CM | POA: Diagnosis not present

## 2013-10-27 DIAGNOSIS — E1169 Type 2 diabetes mellitus with other specified complication: Secondary | ICD-10-CM | POA: Diagnosis not present

## 2013-10-27 DIAGNOSIS — J449 Chronic obstructive pulmonary disease, unspecified: Secondary | ICD-10-CM | POA: Diagnosis not present

## 2013-10-27 DIAGNOSIS — I10 Essential (primary) hypertension: Secondary | ICD-10-CM | POA: Diagnosis not present

## 2013-10-27 DIAGNOSIS — H269 Unspecified cataract: Secondary | ICD-10-CM | POA: Diagnosis not present

## 2013-11-28 ENCOUNTER — Encounter: Payer: Self-pay | Admitting: Internal Medicine

## 2013-12-01 ENCOUNTER — Ambulatory Visit: Payer: Medicare Other | Admitting: Internal Medicine

## 2013-12-02 ENCOUNTER — Other Ambulatory Visit (HOSPITAL_BASED_OUTPATIENT_CLINIC_OR_DEPARTMENT_OTHER): Payer: Medicare Other

## 2013-12-02 ENCOUNTER — Encounter: Payer: Self-pay | Admitting: Hematology and Oncology

## 2013-12-02 ENCOUNTER — Telehealth: Payer: Self-pay | Admitting: Hematology and Oncology

## 2013-12-02 ENCOUNTER — Ambulatory Visit (HOSPITAL_BASED_OUTPATIENT_CLINIC_OR_DEPARTMENT_OTHER): Payer: Medicare Other | Admitting: Hematology and Oncology

## 2013-12-02 VITALS — BP 136/54 | HR 95 | Temp 97.8°F | Resp 18 | Ht 64.0 in | Wt 97.8 lb

## 2013-12-02 DIAGNOSIS — R634 Abnormal weight loss: Secondary | ICD-10-CM

## 2013-12-02 DIAGNOSIS — Z85038 Personal history of other malignant neoplasm of large intestine: Secondary | ICD-10-CM

## 2013-12-02 DIAGNOSIS — E039 Hypothyroidism, unspecified: Secondary | ICD-10-CM

## 2013-12-02 DIAGNOSIS — R5383 Other fatigue: Secondary | ICD-10-CM

## 2013-12-02 DIAGNOSIS — C50119 Malignant neoplasm of central portion of unspecified female breast: Secondary | ICD-10-CM

## 2013-12-02 DIAGNOSIS — D500 Iron deficiency anemia secondary to blood loss (chronic): Secondary | ICD-10-CM

## 2013-12-02 DIAGNOSIS — Z853 Personal history of malignant neoplasm of breast: Secondary | ICD-10-CM

## 2013-12-02 DIAGNOSIS — E890 Postprocedural hypothyroidism: Secondary | ICD-10-CM

## 2013-12-02 DIAGNOSIS — K31811 Angiodysplasia of stomach and duodenum with bleeding: Secondary | ICD-10-CM | POA: Diagnosis not present

## 2013-12-02 DIAGNOSIS — G4700 Insomnia, unspecified: Secondary | ICD-10-CM

## 2013-12-02 DIAGNOSIS — R5381 Other malaise: Secondary | ICD-10-CM

## 2013-12-02 DIAGNOSIS — C187 Malignant neoplasm of sigmoid colon: Secondary | ICD-10-CM

## 2013-12-02 LAB — CBC & DIFF AND RETIC
BASO%: 0.2 % (ref 0.0–2.0)
BASOS ABS: 0 10*3/uL (ref 0.0–0.1)
EOS ABS: 0.1 10*3/uL (ref 0.0–0.5)
EOS%: 0.9 % (ref 0.0–7.0)
HEMATOCRIT: 37.6 % (ref 34.8–46.6)
HEMOGLOBIN: 12.1 g/dL (ref 11.6–15.9)
IMMATURE RETIC FRACT: 4.3 % (ref 1.60–10.00)
LYMPH#: 1 10*3/uL (ref 0.9–3.3)
LYMPH%: 16.2 % (ref 14.0–49.7)
MCH: 32.7 pg (ref 25.1–34.0)
MCHC: 32.2 g/dL (ref 31.5–36.0)
MCV: 101.6 fL — ABNORMAL HIGH (ref 79.5–101.0)
MONO#: 0.4 10*3/uL (ref 0.1–0.9)
MONO%: 6.6 % (ref 0.0–14.0)
NEUT#: 4.5 10*3/uL (ref 1.5–6.5)
NEUT%: 76.1 % (ref 38.4–76.8)
Platelets: 216 10*3/uL (ref 145–400)
RBC: 3.7 10*6/uL (ref 3.70–5.45)
RDW: 14.4 % (ref 11.2–14.5)
Retic %: 1.4 % (ref 0.70–2.10)
Retic Ct Abs: 51.8 10*3/uL (ref 33.70–90.70)
WBC: 5.9 10*3/uL (ref 3.9–10.3)

## 2013-12-02 LAB — COMPREHENSIVE METABOLIC PANEL (CC13)
ALT: 11 U/L (ref 0–55)
ANION GAP: 13 meq/L — AB (ref 3–11)
AST: 16 U/L (ref 5–34)
Albumin: 4.1 g/dL (ref 3.5–5.0)
Alkaline Phosphatase: 85 U/L (ref 40–150)
BILIRUBIN TOTAL: 0.23 mg/dL (ref 0.20–1.20)
BUN: 11.5 mg/dL (ref 7.0–26.0)
CALCIUM: 9.4 mg/dL (ref 8.4–10.4)
CHLORIDE: 109 meq/L (ref 98–109)
CO2: 23 mEq/L (ref 22–29)
Creatinine: 0.7 mg/dL (ref 0.6–1.1)
Glucose: 89 mg/dl (ref 70–140)
Potassium: 4 mEq/L (ref 3.5–5.1)
SODIUM: 145 meq/L (ref 136–145)
TOTAL PROTEIN: 7 g/dL (ref 6.4–8.3)

## 2013-12-02 LAB — LACTATE DEHYDROGENASE (CC13): LDH: 158 U/L (ref 125–245)

## 2013-12-02 LAB — T4, FREE: Free T4: 1.5 ng/dL (ref 0.80–1.80)

## 2013-12-02 MED ORDER — MIRTAZAPINE 15 MG PO TABS: 15.0000 mg | ORAL_TABLET | Freq: Every day | ORAL | Status: DC

## 2013-12-02 NOTE — Assessment & Plan Note (Signed)
She has loss of appetite. She denies change in bowel habits. Her last colonoscopy a year ago was negative for recurrence. However, she has unexplained weight loss. She agreed to proceed with CT scan of the chest, abdomen and pelvis to exclude disease recurrence.

## 2013-12-02 NOTE — Assessment & Plan Note (Signed)
She complained of profound fatigue. I will proceed with her rechecking her thyroid function tests as requested.

## 2013-12-02 NOTE — Progress Notes (Signed)
[place] OFFICE PROGRESS NOTE  TRIPP, Mallie Mussel, MD DIAGNOSIS:  Recurrent iron deficiency anemia due to angiodysplasia  SUMMARY OF HEMATOLOGIC HISTORY: This is a pleasant lady with background history of chronic GI bleed. She had remote history of colon cancer diagnosed in 1999 status post resection. She also had remote history of breast cancer diagnosed in 2000, neither records were available. She had EGD and colonoscopy in 2014 which showed angioectasias and multiple benign polyps. In May of 2014 and November 2014, she received 1 dose of intravenous iron infusion with improvement of her hemoglobin. INTERVAL HISTORY: Laura [name] 79 y.o. female returns for further followup. She continues to feel unwell and have unexplained weight loss. She has significant fatigue. The patient denies any recent signs or symptoms of bleeding such as spontaneous epistaxis, hematuria or hematochezia. She denies any recent abnormal breast examination, palpable mass, abnormal breast appearance or nipple changes   I have reviewed the past medical history, past surgical history, social history and family history with the patient and they are unchanged from previous note.  ALLERGIES:  is allergic to diclofenac.  MEDICATIONS:  Current Outpatient Prescriptions  Medication Sig Dispense Refill  . ALPRAZolam (XANAX) 0.25 MG tablet Take 1 tablet (0.25 mg total) by mouth 3 (three) times daily as needed for sleep or anxiety.  30 tablet  0  . budesonide-formoterol (SYMBICORT) 160-4.5 MCG/ACT inhaler Inhale 2 puffs into the lungs 2 (two) times daily.      Marland Kitchen gabapentin (NEURONTIN) 100 MG capsule Take 100 mg by mouth 2 (two) times daily.      Marland Kitchen levothyroxine (SYNTHROID, LEVOTHROID) 50 MCG tablet Take 50 mcg by mouth daily.        . metFORMIN (GLUCOPHAGE) 1000 MG tablet Take 250 mg by mouth 2 (two) times daily with a meal.       . simvastatin (ZOCOR) 10 MG tablet Take 10 mg by mouth at bedtime.       .  Fluticasone-Salmeterol (ADVAIR) 100-50 MCG/DOSE AEPB Inhale 1 puff into the lungs every 12 (twelve) hours.        Marland Kitchen levalbuterol (XOPENEX HFA) 45 MCG/ACT inhaler Inhale into the lungs every 4 (four) hours as needed for wheezing.      . mirtazapine (REMERON) 15 MG tablet Take 1 tablet (15 mg total) by mouth at bedtime.  30 tablet  3   No current facility-administered medications for this visit.     REVIEW OF SYSTEMS:   Constitutional: Denies fevers, chills or night sweats Eyes: Denies blurriness of vision Ears, nose, mouth, throat, and face: Denies mucositis or sore throat Respiratory: Denies cough, dyspnea or wheezes Cardiovascular: Denies palpitation, chest discomfort or lower extremity swelling Gastrointestinal:  Denies nausea, heartburn or change in bowel habits Skin: Denies abnormal skin rashes Lymphatics: Denies new lymphadenopathy or easy bruising Neurological:Denies numbness, tingling or new weaknesses Behavioral/Psych: Mood is stable, no new changes  All other systems were reviewed with the patient and are negative.  PHYSICAL EXAMINATION: ECOG PERFORMANCE STATUS: 1 - Symptomatic but completely ambulatory  Filed Vitals:   12/02/13 1446  BP: 136/54  Pulse: 95  Temp: 97.8 F (36.6 C)  Resp: 18   Filed Weights   12/02/13 1446  Weight: 97 lb 12.8 oz (44.362 kg)    GENERAL:alert, no distress and comfortable SKIN: skin color, texture, turgor are normal, no rashes or significant lesions EYES: normal, Conjunctiva are pink and non-injected, sclera clear Musculoskeletal:no cyanosis of digits and no clubbing  NEURO: alert & oriented  x 3 with fluent speech, no focal motor/sensory deficits Bilateral chest wall examination show well-healed mastectomy scar with no palpable abnormalities. LABORATORY DATA:  I have reviewed the data as listed Results for orders placed in visit on 12/02/13 (from the past 48 hour(s))  CBC & DIFF AND RETIC     Status: Abnormal   Collection Time     12/02/13  2:33 PM      Result Value Ref Range   WBC 5.9  3.9 - 10.3 10e3/uL   NEUT# 4.5  1.5 - 6.5 10e3/uL   HGB 12.1  11.6 - 15.9 g/dL   HCT 37.6  34.8 - 46.6 %   Platelets 216  145 - 400 10e3/uL   MCV 101.6 (*) 79.5 - 101.0 fL   MCH 32.7  25.1 - 34.0 pg   MCHC 32.2  31.5 - 36.0 g/dL   RBC 3.70  3.70 - 5.45 10e6/uL   RDW 14.4  11.2 - 14.5 %   lymph# 1.0  0.9 - 3.3 10e3/uL   MONO# 0.4  0.1 - 0.9 10e3/uL   Eosinophils Absolute 0.1  0.0 - 0.5 10e3/uL   Basophils Absolute 0.0  0.0 - 0.1 10e3/uL   NEUT% 76.1  38.4 - 76.8 %   LYMPH% 16.2  14.0 - 49.7 %   MONO% 6.6  0.0 - 14.0 %   EOS% 0.9  0.0 - 7.0 %   BASO% 0.2  0.0 - 2.0 %   Retic % 1.40  0.70 - 2.10 %   Retic Ct Abs 51.80  33.70 - 90.70 10e3/uL   Immature Retic Fract 4.30  1.60 - 10.00 %  COMPREHENSIVE METABOLIC PANEL (WF09)     Status: Abnormal   Collection Time    12/02/13  2:34 PM      Result Value Ref Range   Sodium 145  136 - 145 mEq/L   Potassium 4.0  3.5 - 5.1 mEq/L   Chloride 109  98 - 109 mEq/L   CO2 23  22 - 29 mEq/L   Glucose 89  70 - 140 mg/dl   BUN 11.5  7.0 - 26.0 mg/dL   Creatinine 0.7  0.6 - 1.1 mg/dL   Total Bilirubin 0.23  0.20 - 1.20 mg/dL   Alkaline Phosphatase 85  40 - 150 U/L   AST 16  5 - 34 U/L   ALT 11  0 - 55 U/L   Total Protein 7.0  6.4 - 8.3 g/dL   Albumin 4.1  3.5 - 5.0 g/dL   Calcium 9.4  8.4 - 10.4 mg/dL   Anion Gap 13 (*) 3 - 11 mEq/L    Lab Results  Component Value Date   WBC 5.9 12/02/2013   HGB 12.1 12/02/2013   HCT 37.6 12/02/2013   MCV 101.6* 12/02/2013   PLT 216 12/02/2013   ASSESSMENT & PLAN:  Insomnia I recommend a trial of mirtazapine. She agreed  BREAST CANCER, HX OF Examination of her chest wall showed no evidence of recurrence. However, she has unexplained weight loss. She agreed to proceed with CT scan of the chest, abdomen and pelvis to exclude disease recurrence.  COLON CANCER, HX OF She has loss of appetite. She denies change in bowel habits. Her last colonoscopy a  year ago was negative for recurrence. However, she has unexplained weight loss. She agreed to proceed with CT scan of the chest, abdomen and pelvis to exclude disease recurrence.   Gastric and duodenal angiodysplasia with hemorrhage She has received  multiple dosage of intravenous iron in May and November 2014. She is no longer anemic today. I recommend we continue observation with history, physical examination and blood work in 3 months.  HYPOTHYROIDISM, POST-RADIATION She complained of profound fatigue. I will proceed with her rechecking her thyroid function tests as requested.   All questions were answered. The patient knows to call the clinic with any problems, questions or concerns. No barriers to learning was detected.    Heath Lark, MD 12/02/2013 3:26 PM

## 2013-12-02 NOTE — Telephone Encounter (Signed)
gv adn drpinted appt sched and avs for pt for SEpt...gv pt barium

## 2013-12-02 NOTE — Assessment & Plan Note (Signed)
I recommend a trial of mirtazapine. She agreed

## 2013-12-02 NOTE — Assessment & Plan Note (Signed)
She has received multiple dosage of intravenous iron in May and November 2014. She is no longer anemic today. I recommend we continue observation with history, physical examination and blood work in 3 months.

## 2013-12-02 NOTE — Assessment & Plan Note (Signed)
Examination of her chest wall showed no evidence of recurrence. However, she has unexplained weight loss. She agreed to proceed with CT scan of the chest, abdomen and pelvis to exclude disease recurrence.

## 2013-12-05 ENCOUNTER — Telehealth: Payer: Self-pay | Admitting: Hematology and Oncology

## 2013-12-05 LAB — TSH CHCC: TSH: 1.156 m[IU]/L (ref 0.308–3.960)

## 2013-12-05 LAB — FERRITIN CHCC: Ferritin: 22 ng/ml (ref 9–269)

## 2013-12-05 NOTE — Telephone Encounter (Signed)
Left the patient a voice mail. Thyroid function tests were within normal limits. I am waiting for results of CT scan and we'll call the patient with results.

## 2013-12-09 ENCOUNTER — Ambulatory Visit (HOSPITAL_COMMUNITY)
Admission: RE | Admit: 2013-12-09 | Discharge: 2013-12-09 | Disposition: A | Discharge status: Home / Self Care | Payer: Medicare Other | Source: Ambulatory Visit | Attending: Hematology and Oncology | Admitting: Hematology and Oncology

## 2013-12-09 DIAGNOSIS — C50919 Malignant neoplasm of unspecified site of unspecified female breast: Secondary | ICD-10-CM | POA: Diagnosis not present

## 2013-12-09 DIAGNOSIS — I700 Atherosclerosis of aorta: Secondary | ICD-10-CM | POA: Insufficient documentation

## 2013-12-09 DIAGNOSIS — M47817 Spondylosis without myelopathy or radiculopathy, lumbosacral region: Secondary | ICD-10-CM | POA: Diagnosis not present

## 2013-12-09 DIAGNOSIS — K838 Other specified diseases of biliary tract: Secondary | ICD-10-CM | POA: Diagnosis not present

## 2013-12-09 DIAGNOSIS — M47814 Spondylosis without myelopathy or radiculopathy, thoracic region: Secondary | ICD-10-CM | POA: Diagnosis not present

## 2013-12-09 DIAGNOSIS — I2510 Atherosclerotic heart disease of native coronary artery without angina pectoris: Secondary | ICD-10-CM | POA: Diagnosis not present

## 2013-12-09 DIAGNOSIS — J438 Other emphysema: Secondary | ICD-10-CM | POA: Insufficient documentation

## 2013-12-09 DIAGNOSIS — C50119 Malignant neoplasm of central portion of unspecified female breast: Secondary | ICD-10-CM

## 2013-12-09 MED ORDER — IOHEXOL 300 MG/ML  SOLN
100.0000 mL | Freq: Once | INTRAMUSCULAR | Status: AC | PRN
  Administered 2013-12-09: 100 mL via INTRAVENOUS

## 2013-12-12 ENCOUNTER — Telehealth: Payer: Self-pay | Admitting: *Deleted

## 2013-12-12 NOTE — Telephone Encounter (Signed)
Informed pt of Dr. Calton Dach note below, all tests/CT scans are negative and to f/u w/ PCP for management of weight loss.  Pt verbalized understanding.

## 2013-12-12 NOTE — Telephone Encounter (Signed)
Message copied by Cathlean Cower on Mon Dec 12, 2013  5:13 PM ------      Message from: [facility], [place]      Created: Sat Dec 10, 2013  1:57 PM      Regarding: Ct scan       All tests are negative.      She needs to follow with PCP for further management of weight loss      ----- Message -----         From: Rad Results In Interface         Sent: 12/09/2013   3:41 PM           To: Heath Lark, MD                   ------

## 2013-12-23 DIAGNOSIS — F17200 Nicotine dependence, unspecified, uncomplicated: Secondary | ICD-10-CM | POA: Diagnosis not present

## 2013-12-23 DIAGNOSIS — E1169 Type 2 diabetes mellitus with other specified complication: Secondary | ICD-10-CM | POA: Diagnosis not present

## 2013-12-23 DIAGNOSIS — I10 Essential (primary) hypertension: Secondary | ICD-10-CM | POA: Diagnosis not present

## 2013-12-23 DIAGNOSIS — E785 Hyperlipidemia, unspecified: Secondary | ICD-10-CM | POA: Diagnosis not present

## 2013-12-23 DIAGNOSIS — M6281 Muscle weakness (generalized): Secondary | ICD-10-CM | POA: Diagnosis not present

## 2013-12-23 DIAGNOSIS — J449 Chronic obstructive pulmonary disease, unspecified: Secondary | ICD-10-CM | POA: Diagnosis not present

## 2013-12-23 DIAGNOSIS — H269 Unspecified cataract: Secondary | ICD-10-CM | POA: Diagnosis not present

## 2013-12-23 DIAGNOSIS — D500 Iron deficiency anemia secondary to blood loss (chronic): Secondary | ICD-10-CM | POA: Diagnosis not present

## 2014-01-20 DIAGNOSIS — I10 Essential (primary) hypertension: Secondary | ICD-10-CM | POA: Diagnosis not present

## 2014-01-20 DIAGNOSIS — E039 Hypothyroidism, unspecified: Secondary | ICD-10-CM | POA: Diagnosis not present

## 2014-01-20 DIAGNOSIS — F17200 Nicotine dependence, unspecified, uncomplicated: Secondary | ICD-10-CM | POA: Diagnosis not present

## 2014-01-20 DIAGNOSIS — H269 Unspecified cataract: Secondary | ICD-10-CM | POA: Diagnosis not present

## 2014-01-20 DIAGNOSIS — M6281 Muscle weakness (generalized): Secondary | ICD-10-CM | POA: Diagnosis not present

## 2014-01-20 DIAGNOSIS — E1169 Type 2 diabetes mellitus with other specified complication: Secondary | ICD-10-CM | POA: Diagnosis not present

## 2014-01-20 DIAGNOSIS — E785 Hyperlipidemia, unspecified: Secondary | ICD-10-CM | POA: Diagnosis not present

## 2014-01-20 DIAGNOSIS — J449 Chronic obstructive pulmonary disease, unspecified: Secondary | ICD-10-CM | POA: Diagnosis not present

## 2014-02-17 DIAGNOSIS — D500 Iron deficiency anemia secondary to blood loss (chronic): Secondary | ICD-10-CM | POA: Diagnosis not present

## 2014-02-17 DIAGNOSIS — E785 Hyperlipidemia, unspecified: Secondary | ICD-10-CM | POA: Diagnosis not present

## 2014-02-17 DIAGNOSIS — E1169 Type 2 diabetes mellitus with other specified complication: Secondary | ICD-10-CM | POA: Diagnosis not present

## 2014-02-17 DIAGNOSIS — I10 Essential (primary) hypertension: Secondary | ICD-10-CM | POA: Diagnosis not present

## 2014-02-17 DIAGNOSIS — M542 Cervicalgia: Secondary | ICD-10-CM | POA: Diagnosis not present

## 2014-02-17 DIAGNOSIS — E039 Hypothyroidism, unspecified: Secondary | ICD-10-CM | POA: Diagnosis not present

## 2014-02-17 DIAGNOSIS — J449 Chronic obstructive pulmonary disease, unspecified: Secondary | ICD-10-CM | POA: Diagnosis not present

## 2014-02-17 DIAGNOSIS — M6281 Muscle weakness (generalized): Secondary | ICD-10-CM | POA: Diagnosis not present

## 2014-03-07 ENCOUNTER — Telehealth: Payer: Self-pay | Admitting: Hematology and Oncology

## 2014-03-07 ENCOUNTER — Telehealth: Payer: Self-pay | Admitting: *Deleted

## 2014-03-07 ENCOUNTER — Other Ambulatory Visit (HOSPITAL_BASED_OUTPATIENT_CLINIC_OR_DEPARTMENT_OTHER): Payer: Medicare Other

## 2014-03-07 ENCOUNTER — Ambulatory Visit (HOSPITAL_BASED_OUTPATIENT_CLINIC_OR_DEPARTMENT_OTHER): Payer: Medicare Other | Admitting: Hematology and Oncology

## 2014-03-07 ENCOUNTER — Encounter: Payer: Self-pay | Admitting: Hematology and Oncology

## 2014-03-07 VITALS — BP 135/53 | HR 109 | Temp 98.2°F | Resp 19 | Ht 64.0 in | Wt 98.2 lb

## 2014-03-07 DIAGNOSIS — E119 Type 2 diabetes mellitus without complications: Secondary | ICD-10-CM

## 2014-03-07 DIAGNOSIS — D500 Iron deficiency anemia secondary to blood loss (chronic): Secondary | ICD-10-CM

## 2014-03-07 DIAGNOSIS — Z853 Personal history of malignant neoplasm of breast: Secondary | ICD-10-CM | POA: Diagnosis not present

## 2014-03-07 DIAGNOSIS — G8190 Hemiplegia, unspecified affecting unspecified side: Secondary | ICD-10-CM | POA: Diagnosis not present

## 2014-03-07 DIAGNOSIS — K31811 Angiodysplasia of stomach and duodenum with bleeding: Secondary | ICD-10-CM

## 2014-03-07 DIAGNOSIS — G8194 Hemiplegia, unspecified affecting left nondominant side: Secondary | ICD-10-CM | POA: Insufficient documentation

## 2014-03-07 DIAGNOSIS — Z23 Encounter for immunization: Secondary | ICD-10-CM | POA: Diagnosis not present

## 2014-03-07 DIAGNOSIS — C187 Malignant neoplasm of sigmoid colon: Secondary | ICD-10-CM

## 2014-03-07 DIAGNOSIS — F17200 Nicotine dependence, unspecified, uncomplicated: Secondary | ICD-10-CM

## 2014-03-07 DIAGNOSIS — Z85038 Personal history of other malignant neoplasm of large intestine: Secondary | ICD-10-CM

## 2014-03-07 DIAGNOSIS — D509 Iron deficiency anemia, unspecified: Secondary | ICD-10-CM

## 2014-03-07 DIAGNOSIS — Z299 Encounter for prophylactic measures, unspecified: Secondary | ICD-10-CM

## 2014-03-07 HISTORY — DX: Hemiplegia, unspecified affecting left nondominant side: G81.94

## 2014-03-07 LAB — FERRITIN CHCC: FERRITIN: 8 ng/mL — AB (ref 9–269)

## 2014-03-07 LAB — CBC & DIFF AND RETIC
BASO%: 0.3 % (ref 0.0–2.0)
BASOS ABS: 0 10*3/uL (ref 0.0–0.1)
EOS ABS: 0.1 10*3/uL (ref 0.0–0.5)
EOS%: 1.5 % (ref 0.0–7.0)
HEMATOCRIT: 36.4 % (ref 34.8–46.6)
HEMOGLOBIN: 11.6 g/dL (ref 11.6–15.9)
IMMATURE RETIC FRACT: 9.8 % (ref 1.60–10.00)
LYMPH#: 1.2 10*3/uL (ref 0.9–3.3)
LYMPH%: 18.7 % (ref 14.0–49.7)
MCH: 31.8 pg (ref 25.1–34.0)
MCHC: 31.9 g/dL (ref 31.5–36.0)
MCV: 99.7 fL (ref 79.5–101.0)
MONO#: 0.5 10*3/uL (ref 0.1–0.9)
MONO%: 8 % (ref 0.0–14.0)
NEUT#: 4.8 10*3/uL (ref 1.5–6.5)
NEUT%: 71.5 % (ref 38.4–76.8)
Platelets: 283 10*3/uL (ref 145–400)
RBC: 3.65 10*6/uL — ABNORMAL LOW (ref 3.70–5.45)
RDW: 13.4 % (ref 11.2–14.5)
Retic %: 1.87 % (ref 0.70–2.10)
Retic Ct Abs: 68.26 10*3/uL (ref 33.70–90.70)
WBC: 6.6 10*3/uL (ref 3.9–10.3)

## 2014-03-07 MED ORDER — INFLUENZA VAC SPLIT QUAD 0.5 ML IM SUSY
0.5000 mL | PREFILLED_SYRINGE | Freq: Once | INTRAMUSCULAR | Status: AC
  Administered 2014-03-07: 0.5 mL via INTRAMUSCULAR
  Filled 2014-03-07: qty 0.5

## 2014-03-07 NOTE — Assessment & Plan Note (Signed)
She is currently on medical management under the care of her primary care provider.

## 2014-03-07 NOTE — Assessment & Plan Note (Signed)
I spent some time counseling the patient the importance of tobacco cessation. she is currently attempting to quit on her own 

## 2014-03-07 NOTE — Assessment & Plan Note (Signed)
Currently she is not anemic. I recommend close monitoring of blood work after resumption of aspirin.

## 2014-03-07 NOTE — Progress Notes (Signed)
[place] OFFICE PROGRESS NOTE  Patient Care Team: Reymundo Poll, MD as PCP - General (Family Medicine) Jerene Bears, MD as Attending Physician (Gastroenterology) Heath Lark, MD as Consulting Physician (Hematology and Oncology)  SUMMARY OF ONCOLOGIC HISTORY: This is a pleasant lady with background history of chronic GI bleed. She had remote history of colon cancer diagnosed in 1999 status post resection. She also had remote history of breast cancer diagnosed in 2000, neither records were available. She had EGD and colonoscopy in 2014 which showed angioectasias and multiple benign polyps. In May of 2014 and November 2014, she received 1 dose of intravenous iron infusion with improvement of her hemoglobin.  INTERVAL HISTORY: Please see below for problem oriented charting. The patient developed acute weakness 3 weeks ago after complaining of headaches. She also noted some mild speech disturbance and left facial weakness. She denies any focal neurological deficit apart from within her face. Denies swallowing difficulties. Denies recent seizure activities.  REVIEW OF SYSTEMS:   Constitutional: Denies fevers, chills or abnormal weight loss Eyes: Denies blurriness of vision Ears, nose, mouth, throat, and face: Denies mucositis or sore throat Respiratory: Denies cough, dyspnea or wheezes Cardiovascular: Denies palpitation, chest discomfort or lower extremity swelling Gastrointestinal:  Denies nausea, heartburn or change in bowel habits Skin: Denies abnormal skin rashes Lymphatics: Denies new lymphadenopathy or easy bruising Behavioral/Psych: Mood is stable, no new changes  All other systems were reviewed with the patient and are negative.  I have reviewed the past medical history, past surgical history, social history and family history with the patient and they are unchanged from previous note.  ALLERGIES:  is allergic to diclofenac.  MEDICATIONS:  Current Outpatient  Prescriptions  Medication Sig Dispense Refill  . ALPRAZolam (XANAX) 0.25 MG tablet Take 1 tablet (0.25 mg total) by mouth 3 (three) times daily as needed for sleep or anxiety.  30 tablet  0  . budesonide-formoterol (SYMBICORT) 160-4.5 MCG/ACT inhaler Inhale 2 puffs into the lungs 2 (two) times daily.      Marland Kitchen gabapentin (NEURONTIN) 100 MG capsule Take 100 mg by mouth 2 (two) times daily.      Marland Kitchen levothyroxine (SYNTHROID, LEVOTHROID) 50 MCG tablet Take 50 mcg by mouth daily.        . metFORMIN (GLUCOPHAGE) 1000 MG tablet Take 250 mg by mouth 2 (two) times daily with a meal.       . simvastatin (ZOCOR) 10 MG tablet Take 10 mg by mouth at bedtime.        No current facility-administered medications for this visit.    PHYSICAL EXAMINATION: ECOG PERFORMANCE STATUS: 1 - Symptomatic but completely ambulatory  Filed Vitals:   03/07/14 1359  BP: 135/53  Pulse: 109  Temp: 98.2 F (36.8 C)  Resp: 19   Filed Weights   03/07/14 1359  Weight: 98 lb 3.2 oz (44.543 kg)    GENERAL:alert, no distress and comfortable. She looks thin SKIN: skin color, texture, turgor are normal, no rashes or significant lesions EYES: normal, Conjunctiva are pink and non-injected, sclera clear OROPHARYNX:no exudate, no erythema and lips, buccal mucosa, and tongue normal  NECK: supple, thyroid normal size, non-tender, without nodularity LYMPH:  no palpable lymphadenopathy in the cervical, axillary or inguinal LUNGS: clear to auscultation and percussion with normal breathing effort HEART: regular rate & rhythm and no murmurs and no lower extremity edema ABDOMEN:abdomen soft, non-tender and normal bowel sounds Musculoskeletal:no cyanosis of digits and no clubbing  NEURO: alert & oriented x 3  with mild dysphasia, noted left facial weakness. She has hemianopia with right gaze and past-pointing.  LABORATORY DATA:  I have reviewed the data as listed    Component Value Date/Time   NA 145 12/02/2013 1434   NA 142 11/28/2012  1149   K 4.0 12/02/2013 1434   K 3.9 11/28/2012 1149   CL 108 11/28/2012 1149   CL 109* 11/24/2012 0816   CO2 23 12/02/2013 1434   CO2 25 11/28/2012 1149   GLUCOSE 89 12/02/2013 1434   GLUCOSE 99 11/28/2012 1149   GLUCOSE 102* 11/24/2012 0816   BUN 11.5 12/02/2013 1434   BUN 11 11/28/2012 1149   CREATININE 0.7 12/02/2013 1434   CREATININE 0.63 11/28/2012 1149   CALCIUM 9.4 12/02/2013 1434   CALCIUM 9.6 11/28/2012 1149   PROT 7.0 12/02/2013 1434   ALBUMIN 4.1 12/02/2013 1434   AST 16 12/02/2013 1434   ALT 11 12/02/2013 1434   ALKPHOS 85 12/02/2013 1434   BILITOT 0.23 12/02/2013 1434   GFRNONAA 86* 11/28/2012 1149   GFRAA >90 11/28/2012 1149    No results found for this basename: SPEP,  UPEP,   kappa and lambda light chains    Lab Results  Component Value Date   WBC 6.6 03/07/2014   NEUTROABS 4.8 03/07/2014   HGB 11.6 03/07/2014   HCT 36.4 03/07/2014   MCV 99.7 03/07/2014   PLT 283 03/07/2014      Chemistry      Component Value Date/Time   NA 145 12/02/2013 1434   NA 142 11/28/2012 1149   K 4.0 12/02/2013 1434   K 3.9 11/28/2012 1149   CL 108 11/28/2012 1149   CL 109* 11/24/2012 0816   CO2 23 12/02/2013 1434   CO2 25 11/28/2012 1149   BUN 11.5 12/02/2013 1434   BUN 11 11/28/2012 1149   CREATININE 0.7 12/02/2013 1434   CREATININE 0.63 11/28/2012 1149      Component Value Date/Time   CALCIUM 9.4 12/02/2013 1434   CALCIUM 9.6 11/28/2012 1149   ALKPHOS 85 12/02/2013 1434   AST 16 12/02/2013 1434   ALT 11 12/02/2013 1434   BILITOT 0.23 12/02/2013 1434      ASSESSMENT & PLAN:  Acute left hemiparesis Clinically, she has diplopia on the right days, past pointing and left facial weakness. I suspect she had acute stroke recently. It has been more than 3 weeks. She has multiple risk factors including hypertension, hyperlipidemia, diabetes and smoking. Even though she have recurrent GI bleeding in the past, I think the benefit of aspirin outweighs the risk. I recommend she start on half a tablet of aspirin. I will order CT scan of the head and  neurology consultation.  Gastric and duodenal angiodysplasia with hemorrhage Currently she is not anemic. I recommend close monitoring of blood work after resumption of aspirin.  TOBACCO ABUSE I spent some time counseling the patient the importance of tobacco cessation. she is currently attempting to quit on her own  DIABETES MELLITUS, TYPE II She is currently on medical management under the care of her primary care provider.  NEOPLASM, MALIGNANT, SIGMOID COLON Her last colonoscopy was in April 2014. Continue close observation.  Preventive measure We discussed the importance of preventive care and reviewed the vaccination programs. She does not have any prior allergic reactions to influenza vaccination. She agrees to proceed with influenza vaccination today and we will administer it today at the clinic.     Orders Placed This Encounter  Procedures  . CT  Head Wo Contrast    Standing Status: Future     Number of Occurrences:      Standing Expiration Date: 04/11/2015    Order Specific Question:  Reason for exam:    Answer:  left facial weakness, suspect stroke    Order Specific Question:  Preferred imaging location?    Answer:  [facility]  . CBC & Diff and Retic    Standing Status: Standing     Number of Occurrences: 9     Standing Expiration Date: 03/08/2015  . Ferritin    Standing Status: Standing     Number of Occurrences: 9     Standing Expiration Date: 03/08/2015  . Ambulatory referral to Neurology    Referral Priority:  Routine    Referral Type:  Consultation    Referral Reason:  Specialty Services Required    Requested Specialty:  Neurology    Number of Visits Requested:  1   All questions were answered. The patient knows to call the clinic with any problems, questions or concerns. No barriers to learning was detected. I spent 40 minutes counseling the patient face to face. The total time spent in the appointment was 55 minutes and more than 50% was on  counseling and review of test results     [facility], [place], MD 03/07/2014 2:32 PM

## 2014-03-07 NOTE — Telephone Encounter (Signed)
Message copied by Cathlean Cower on Tue Mar 07, 2014  3:53 PM ------      Message from: [facility], [place]: Tue Mar 07, 2014  3:08 PM      Regarding: low ferritin       PLs call daughter to tell patient to take OTC iron daily at bedtime. Will recheck next month      ----- Message -----         From: Lab in [facility] Interface         Sent: 03/07/2014   2:01 PM           To: Heath Lark, MD                   ------

## 2014-03-07 NOTE — Telephone Encounter (Signed)
Pt confirmed labs/ov per 09/08 POF, cld for referral at [facility] will c/b to schedule...gave pt AVS...kJ

## 2014-03-07 NOTE — Assessment & Plan Note (Signed)
We discussed the importance of preventive care and reviewed the vaccination programs. She does not have any prior allergic reactions to influenza vaccination. She agrees to proceed with influenza vaccination today and we will administer it today at the clinic.  

## 2014-03-07 NOTE — Assessment & Plan Note (Signed)
Her last colonoscopy was in April 2014. Continue close observation.

## 2014-03-07 NOTE — Telephone Encounter (Signed)
Informed daughter of Dr. Calton Dach note below.  She verbalized understanding.

## 2014-03-07 NOTE — Assessment & Plan Note (Signed)
Clinically, she has diplopia on the right days, past pointing and left facial weakness. I suspect she had acute stroke recently. It has been more than 3 weeks. She has multiple risk factors including hypertension, hyperlipidemia, diabetes and smoking. Even though she have recurrent GI bleeding in the past, I think the benefit of aspirin outweighs the risk. I recommend she start on half a tablet of aspirin. I will order CT scan of the head and neurology consultation.

## 2014-03-09 ENCOUNTER — Ambulatory Visit (HOSPITAL_COMMUNITY)
Admission: RE | Admit: 2014-03-09 | Discharge: 2014-03-09 | Disposition: A | Discharge status: Home / Self Care | Payer: Medicare Other | Source: Ambulatory Visit | Attending: Hematology and Oncology | Admitting: Hematology and Oncology

## 2014-03-09 DIAGNOSIS — G8194 Hemiplegia, unspecified affecting left nondominant side: Secondary | ICD-10-CM

## 2014-03-09 DIAGNOSIS — R29810 Facial weakness: Secondary | ICD-10-CM | POA: Insufficient documentation

## 2014-03-09 DIAGNOSIS — I6529 Occlusion and stenosis of unspecified carotid artery: Secondary | ICD-10-CM | POA: Insufficient documentation

## 2014-03-09 DIAGNOSIS — R51 Headache: Secondary | ICD-10-CM | POA: Diagnosis not present

## 2014-03-09 DIAGNOSIS — G319 Degenerative disease of nervous system, unspecified: Secondary | ICD-10-CM | POA: Insufficient documentation

## 2014-03-09 DIAGNOSIS — I6789 Other cerebrovascular disease: Secondary | ICD-10-CM | POA: Diagnosis not present

## 2014-03-09 DIAGNOSIS — D500 Iron deficiency anemia secondary to blood loss (chronic): Secondary | ICD-10-CM

## 2014-03-13 ENCOUNTER — Telehealth: Payer: Self-pay | Admitting: *Deleted

## 2014-03-13 NOTE — Telephone Encounter (Signed)
Informed pt of CT head negative and to see Neurologist as ordered.  Pt states she did get appt at [facility] but had a "bad experience" with the Neurologist they scheduled her with.  She has requested appt w/ a different neurologist and is waiting to hear back.

## 2014-03-13 NOTE — Telephone Encounter (Signed)
Message copied by Cathlean Cower on Mon Mar 13, 2014  4:10 PM ------      Message from: [facility], [place]      Created: Mon Mar 13, 2014  9:37 AM      Regarding: CT head       PLs let her know Ct head is negative but she still needs to see a neurologist      ----- Message -----         From: Rad Results In Interface         Sent: 03/09/2014   8:23 PM           To: Heath Lark, MD                   ------

## 2014-03-21 ENCOUNTER — Ambulatory Visit (INDEPENDENT_AMBULATORY_CARE_PROVIDER_SITE_OTHER): Payer: Medicare Other | Admitting: Neurology

## 2014-03-21 ENCOUNTER — Encounter: Payer: Self-pay | Admitting: Neurology

## 2014-03-21 VITALS — BP 110/70 | HR 67 | Ht 61.0 in | Wt 99.0 lb

## 2014-03-21 DIAGNOSIS — Z85038 Personal history of other malignant neoplasm of large intestine: Secondary | ICD-10-CM

## 2014-03-21 DIAGNOSIS — R269 Unspecified abnormalities of gait and mobility: Secondary | ICD-10-CM | POA: Diagnosis not present

## 2014-03-21 DIAGNOSIS — C50119 Malignant neoplasm of central portion of unspecified female breast: Secondary | ICD-10-CM | POA: Diagnosis not present

## 2014-03-21 NOTE — Progress Notes (Signed)
PATIENT: Laura [name] DOB: 1934/09/12  HISTORICAL  Laura [name] is a 79 yo RH AAF, referred by Dr. Alvy Bimler for evaluation of stroke.  She has past medical history of diabetes since 1997, hypertension, hyperlipidemia, breast cancer, status post double mastectomy in 1999, followed by radiation therapy, also had a history of colon cancer, status post colectomy, no radiation, chemotherapy required  In December 2014, she developed severe anemia, due to GI bleeding, She had EGD and colonoscopy in 2014 which showed angioectasias and multiple benign polyps. She had blood transfusion, and iron infusion in May of 2014 and November 2014,with improvement of her hemoglobin, most recent hemoglobin 11.6, ferritin was 8 in September 2015.  She reported strokelike symptoms twice, the first one in 2014, she presented with sudden onset dysarthria, right arm, and leg weakness, gait difficulty, fell to ground, there was no workup in the system, she began to ambulate with a cane since.  The second episode was in September seventh 2015, she had acute worsening of facial droopy, right arm, and right leg weakness, gait difficulty,  In summer of 2015, she has developed right upper and lower face weakness, consistent with right Bell's palsy, only with mild improvement over the past few months  Over the past 3 years, she also complains of urinary stress incontinence  CAT scan of the brain showed moderate atrophy, small vessel disease  MRI cervical in 2012 showed multilevel degenerative disc disease, mild canal stenosis  REVIEW OF SYSTEMS: Full 14 system review of systems performed and notable only for blurred vision and, stress incontinence, feeling hot, feeling cold, achy muscles, confusion, headaches, weakness, slurred speech, dizziness,  ALLERGIES: Allergies  Allergen Reactions  . Diclofenac Nausea And Vomiting    HOME MEDICATIONS: Current Outpatient Prescriptions on File Prior to Visit  Medication  Sig Dispense Refill  . ALPRAZolam (XANAX) 0.25 MG tablet Take 1 tablet (0.25 mg total) by mouth 3 (three) times daily as needed for sleep or anxiety.  30 tablet  0  . budesonide-formoterol (SYMBICORT) 160-4.5 MCG/ACT inhaler Inhale 2 puffs into the lungs 2 (two) times daily.      Marland Kitchen gabapentin (NEURONTIN) 100 MG capsule Take 100 mg by mouth 2 (two) times daily.      Marland Kitchen levothyroxine (SYNTHROID, LEVOTHROID) 50 MCG tablet Take 50 mcg by mouth daily.        . metFORMIN (GLUCOPHAGE) 1000 MG tablet Take 250 mg by mouth 2 (two) times daily with a meal.       . simvastatin (ZOCOR) 10 MG tablet Take 10 mg by mouth at bedtime.        No current facility-administered medications on file prior to visit.    PAST MEDICAL HISTORY: Past Medical History  Diagnosis Date  . Type II diabetes mellitus     neuropathy  . Hypercholesteremia   . Hypertension   . Chronic obstructive bronchitis   . Anemia   . Hypothyroidism   . Idiopathic peripheral neuropathy   . Muscle weakness (generalized)   . Myofacial muscle pain   . Depression   . Renal colic   . Hx of vaginal bleeding   . Hx of colonic polyps   . Iron deficiency anemia secondary to blood loss (chronic) 11/17/2012  . Iron deficiency anemia secondary to blood loss (chronic) 11/17/2012  . Breast cancer 2001    bilatera cancer; bilateral mastectomy; no chemo; 5 years of adjuvant hormonal therapy.  Was at [facility] [place].   . Cancer of colon 1999  UVA again, not sure stage I or II, no chemo recommended.   . Acute left hemiparesis 03/07/2014    PAST SURGICAL HISTORY: Past Surgical History  Procedure Laterality Date  . Partial colectomy    . Mastectomy      double  . Tonsillectomy    . Colon surgery    . Hernia repair      umblical hernia  . Enteroscopy N/A 11/25/2012    Procedure: ENTEROSCOPY;  Surgeon: Jerene Bears, MD;  Location: [facility];  Service: Gastroenterology;  Laterality: N/A;  with apc    FAMILY HISTORY: Family History    Problem Relation Age of Onset  . Breast cancer Maternal Aunt   . Breast cancer Daughter   . Lung cancer Brother     X2 died from  . Cancer Brother     cancer  . Lung cancer Sister     died from  . Cancer Mother 37    colon cancer    SOCIAL HISTORY:  History   Social History  . Marital Status: Widowed    Spouse Name: N/A    Number of Children: 38  . 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    Social History Main Topics  . Smoking status: Former Smoker -- 0.50 packs/day for 50 years    Types: Cigarettes    Quit date: 06/27/2012  . Smokeless tobacco: Never Used  . Alcohol Use: No  . Drug Use: No  . Sexual Activity: Not on file   Other Topics Concern  . Not on file   Social History Narrative  . No narrative on file     PHYSICAL EXAM   Filed Vitals:   03/21/14 1326  Height: 5\' 1"  (1.549 m)  Weight: 99 lb (44.906 kg)    Not recorded    Body mass index is 18.72 kg/(m^2).   Generalized: In no acute distress  Neck: Supple, no carotid bruits   Cardiac: Regular rate rhythm  Pulmonary: Clear to auscultation bilaterally  Musculoskeletal: No deformity  Neurological examination  Mentation: Alert oriented to time, place, history taking, and causual conversation  Cranial nerve II-XII: Pupils were equal round reactive to light. Extraocular movements were full.  Visual field were full on confrontational test. Bilateral fundi were sharp.  Facial sensation was normal. She has right upper and lower face weakness, she could not close her right eye, only was able to cover the lower edge of right cornea. Significant right lower face weakness. Hearing was intact to finger rubbing bilaterally. Uvula tongue midline.  Head turning and shoulder shrug and were normal and symmetric.Tongue protrusion into cheek strength was normal.  Motor: Mild bilateral upper and lower extremity spasticity, no significant  weakness.  Sensory: Intact to fine touch, pinprick, preserved vibratory sensation, and proprioception at toes.  Coordination: Normal finger to nose, heel-to-shin bilaterally there was no truncal ataxia  Gait: Rising up from seated position pushing on a chair arm, stiff, cautious gait, dragging right leg across the floor  Deep tendon reflexes: Brachioradialis 3/3, biceps 3/3, triceps3/3, patellar 3/3, Achilles 3/3, plantar responses were extensor bilaterally.   DIAGNOSTIC DATA (LABS, IMAGING, TESTING) - I reviewed patient records, labs, notes, testing and imaging myself where available.  Lab Results  Component Value Date   WBC 6.6 03/07/2014   HGB 11.6 03/07/2014   HCT 36.4 03/07/2014   MCV 99.7 03/07/2014   PLT 283 03/07/2014  Component Value Date/Time   NA 145 12/02/2013 1434   NA 142 11/28/2012 1149   K 4.0 12/02/2013 1434   K 3.9 11/28/2012 1149   CL 108 11/28/2012 1149   CL 109* 11/24/2012 0816   CO2 23 12/02/2013 1434   CO2 25 11/28/2012 1149   GLUCOSE 89 12/02/2013 1434   GLUCOSE 99 11/28/2012 1149   GLUCOSE 102* 11/24/2012 0816   BUN 11.5 12/02/2013 1434   BUN 11 11/28/2012 1149   CREATININE 0.7 12/02/2013 1434   CREATININE 0.63 11/28/2012 1149   CALCIUM 9.4 12/02/2013 1434   CALCIUM 9.6 11/28/2012 1149   PROT 7.0 12/02/2013 1434   ALBUMIN 4.1 12/02/2013 1434   AST 16 12/02/2013 1434   ALT 11 12/02/2013 1434   ALKPHOS 85 12/02/2013 1434   BILITOT 0.23 12/02/2013 1434   GFRNONAA 86* 11/28/2012 1149   GFRAA >90 11/28/2012 1149   No results found for this basename: CHOL, HDL, LDLCALC, LDLDIRECT, TRIG, CHOLHDL   No results found for this basename: HGBA1C   No results found for this basename: VITAMINB12   Lab Results  Component Value Date   TSH 1.156 12/02/2013    ASSESSMENT AND PLAN  Laura [name] is a 79 y.o. female with vascular risk factor of diabetes, hypertension, hyperlipidemia, presenting with worsening gait difficulty, strokelike symptoms, acute worsening of right-sided weakness, on examination,  she has hyperreflexia, bilateral Babinski signs, previous history of cervical degenerative disc disease, she also has evidence of significant right upper and lower face weakness, consistent with her recent diagnosis of right Bell's palsy.  1. Differentiation diagnosis of her acute worsening gait difficulty, right-sided weakness including stroke, vs. cervical myelopathy 2. Complete evaluation with MRI of brain with and without, cervical spine, 3. Home physical therapy, return to clinic in one month    Marcial Pacas, M.D. Ph.D.  [facility] [address] [place], [place] [address] [phone]

## 2014-03-22 DIAGNOSIS — E1169 Type 2 diabetes mellitus with other specified complication: Secondary | ICD-10-CM | POA: Diagnosis not present

## 2014-03-22 DIAGNOSIS — E039 Hypothyroidism, unspecified: Secondary | ICD-10-CM | POA: Diagnosis not present

## 2014-03-22 DIAGNOSIS — M542 Cervicalgia: Secondary | ICD-10-CM | POA: Diagnosis not present

## 2014-03-22 DIAGNOSIS — I10 Essential (primary) hypertension: Secondary | ICD-10-CM | POA: Diagnosis not present

## 2014-03-22 DIAGNOSIS — E785 Hyperlipidemia, unspecified: Secondary | ICD-10-CM | POA: Diagnosis not present

## 2014-03-22 DIAGNOSIS — J449 Chronic obstructive pulmonary disease, unspecified: Secondary | ICD-10-CM | POA: Diagnosis not present

## 2014-03-22 DIAGNOSIS — M6281 Muscle weakness (generalized): Secondary | ICD-10-CM | POA: Diagnosis not present

## 2014-03-22 DIAGNOSIS — D500 Iron deficiency anemia secondary to blood loss (chronic): Secondary | ICD-10-CM | POA: Diagnosis not present

## 2014-04-06 ENCOUNTER — Ambulatory Visit
Admission: RE | Admit: 2014-04-06 | Discharge: 2014-04-06 | Disposition: A | Discharge status: Home / Self Care | Payer: Medicare Other | Source: Ambulatory Visit | Attending: Neurology | Admitting: Neurology

## 2014-04-06 DIAGNOSIS — Z85038 Personal history of other malignant neoplasm of large intestine: Secondary | ICD-10-CM

## 2014-04-06 DIAGNOSIS — R269 Unspecified abnormalities of gait and mobility: Secondary | ICD-10-CM | POA: Diagnosis not present

## 2014-04-06 DIAGNOSIS — C50119 Malignant neoplasm of central portion of unspecified female breast: Secondary | ICD-10-CM

## 2014-04-07 ENCOUNTER — Telehealth: Payer: Self-pay | Admitting: Neurology

## 2014-04-07 ENCOUNTER — Ambulatory Visit: Payer: Medicare Other | Admitting: Neurology

## 2014-04-07 NOTE — Telephone Encounter (Signed)
Patient could not go threw with MRI she needs xanax packet she will have her daughter drive her.

## 2014-04-07 NOTE — Telephone Encounter (Signed)
Patient stated MRI was scheduled late afternoon.  Patient became very claustrophobic and wasn't able to complete.  Patient would like medication for Claustrophobia.  Please call and advise.

## 2014-04-07 NOTE — Telephone Encounter (Signed)
Patient attempted MRI of the brain, and cervical spine at [facility] without success,  Hinton Dyer, I have talked with Joseph Art, we are going to reschedule [facility],   Please call her, let her know, [facility] will contact her for schedule, she can come by to pick up Xanax package.

## 2014-04-07 NOTE — Telephone Encounter (Signed)
Called patient and left her a message Xanax pack ready told her office hours and she will need a driver.

## 2014-04-12 NOTE — Telephone Encounter (Signed)
Patient daughter Thayer Headings came to pick xanax for MRI.

## 2014-04-17 ENCOUNTER — Other Ambulatory Visit (HOSPITAL_BASED_OUTPATIENT_CLINIC_OR_DEPARTMENT_OTHER): Payer: Medicare Other

## 2014-04-17 ENCOUNTER — Ambulatory Visit (HOSPITAL_BASED_OUTPATIENT_CLINIC_OR_DEPARTMENT_OTHER): Payer: Medicare Other | Admitting: Hematology and Oncology

## 2014-04-17 ENCOUNTER — Encounter: Payer: Self-pay | Admitting: Hematology and Oncology

## 2014-04-17 ENCOUNTER — Telehealth: Payer: Self-pay | Admitting: Hematology and Oncology

## 2014-04-17 VITALS — BP 110/44 | HR 89 | Temp 98.2°F | Resp 20 | Ht 61.0 in | Wt 101.0 lb

## 2014-04-17 DIAGNOSIS — G8190 Hemiplegia, unspecified affecting unspecified side: Secondary | ICD-10-CM

## 2014-04-17 DIAGNOSIS — D500 Iron deficiency anemia secondary to blood loss (chronic): Secondary | ICD-10-CM

## 2014-04-17 DIAGNOSIS — G8194 Hemiplegia, unspecified affecting left nondominant side: Secondary | ICD-10-CM

## 2014-04-17 LAB — CBC & DIFF AND RETIC
BASO%: 0.3 % (ref 0.0–2.0)
BASOS ABS: 0 10*3/uL (ref 0.0–0.1)
EOS%: 1.7 % (ref 0.0–7.0)
Eosinophils Absolute: 0.1 10*3/uL (ref 0.0–0.5)
HEMATOCRIT: 35.6 % (ref 34.8–46.6)
HEMOGLOBIN: 11.5 g/dL — AB (ref 11.6–15.9)
IMMATURE RETIC FRACT: 6.2 % (ref 1.60–10.00)
LYMPH#: 1.3 10*3/uL (ref 0.9–3.3)
LYMPH%: 19.3 % (ref 14.0–49.7)
MCH: 31.9 pg (ref 25.1–34.0)
MCHC: 32.3 g/dL (ref 31.5–36.0)
MCV: 98.6 fL (ref 79.5–101.0)
MONO#: 0.4 10*3/uL (ref 0.1–0.9)
MONO%: 6 % (ref 0.0–14.0)
NEUT%: 72.7 % (ref 38.4–76.8)
NEUTROS ABS: 4.8 10*3/uL (ref 1.5–6.5)
Platelets: 262 10*3/uL (ref 145–400)
RBC: 3.61 10*6/uL — ABNORMAL LOW (ref 3.70–5.45)
RDW: 13.9 % (ref 11.2–14.5)
Retic %: 1.3 % (ref 0.70–2.10)
Retic Ct Abs: 46.93 10*3/uL (ref 33.70–90.70)
WBC: 6.5 10*3/uL (ref 3.9–10.3)

## 2014-04-17 LAB — FERRITIN CHCC: Ferritin: 10 ng/ml (ref 9–269)

## 2014-04-17 NOTE — Telephone Encounter (Signed)
Pt confirmed labs/ov per 10/19 POF, sent msg to add Iron 10/22 gave pt AVS......Marland Kitchen

## 2014-04-17 NOTE — Assessment & Plan Note (Signed)
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 The most likely cause of her anemia is due to chronic blood loss/malabsorption syndrome. We discussed some of the risks, benefits, and alternatives of intravenous iron infusions. The patient is symptomatic from anemia and the iron level is critically low. She tolerated oral iron supplement poorly and desires to achieved higher levels of iron faster for adequate hematopoesis. Some of the side-effects to be expected including risks of infusion reactions, phlebitis, headaches, nausea and fatigue.  The patient is willing to proceed. Patient education material was dispensed.  Goal is to keep ferritin level greater than 50

## 2014-04-17 NOTE — Progress Notes (Signed)
[place], MD SUMMARY OF HEMATOLOGIC HISTORY: This is a pleasant lady with background history of chronic GI bleed. She had remote history of colon cancer diagnosed in 1999 status post resection. She also had remote history of breast cancer diagnosed in 2000, neither records were available. She had EGD and colonoscopy in 2014 which showed angioectasias and multiple benign polyps. In May of 2014, November 2014 and March 2015, she received intravenous iron infusion with improvement of her hemoglobin. INTERVAL HISTORY: Laura [name] 79 y.o. female returns for further followup. She denies new neurological deficit. She has resumed aspirin every other day. The patient denies any recent signs or symptoms of bleeding such as spontaneous epistaxis, hematuria or hematochezia.   I have reviewed the past medical history, past surgical history, social history and family history with the patient and they are unchanged from previous note.  ALLERGIES:  is allergic to diclofenac.  MEDICATIONS:  Current Outpatient Prescriptions  Medication Sig Dispense Refill  . ALPRAZolam (XANAX) 0.25 MG tablet Take 1 tablet (0.25 mg total) by mouth 3 (three) times daily as needed for sleep or anxiety.  30 tablet  0  . budesonide-formoterol (SYMBICORT) 160-4.5 MCG/ACT inhaler Inhale 2 puffs into the lungs 2 (two) times daily.      Marland Kitchen gabapentin (NEURONTIN) 100 MG capsule Take 100 mg by mouth 2 (two) times daily.      Marland Kitchen levothyroxine (SYNTHROID, LEVOTHROID) 50 MCG tablet Take 50 mcg by mouth daily.        Marland Kitchen lidocaine (LIDODERM) 5 %       . lisinopril (PRINIVIL,ZESTRIL) 5 MG tablet 5 mg daily.      . metFORMIN (GLUCOPHAGE) 1000 MG tablet Take 250 mg by mouth 2 (two) times daily with a meal.       . simvastatin (ZOCOR) 10 MG tablet Take 10 mg by mouth at bedtime.       . triamcinolone (NASACORT) 55 MCG/ACT AERO nasal inhaler        No current facility-administered  medications for this visit.     REVIEW OF SYSTEMS:   Constitutional: Denies fevers, chills or night sweats Eyes: Denies blurriness of vision Ears, nose, mouth, throat, and face: Denies mucositis or sore throat Respiratory: Denies cough, dyspnea or wheezes Cardiovascular: Denies palpitation, chest discomfort or lower extremity swelling Gastrointestinal:  Denies nausea, heartburn or change in bowel habits Skin: Denies abnormal skin rashes Lymphatics: Denies new lymphadenopathy or easy bruising Neurological:Denies numbness, tingling or new weaknesses Behavioral/Psych: Mood is stable, no new changes  All other systems were reviewed with the patient and are negative.  PHYSICAL EXAMINATION: ECOG PERFORMANCE STATUS: 1 - Symptomatic but completely ambulatory  Filed Vitals:   04/17/14 1445  BP: 110/44  Pulse: 89  Temp: 98.2 F (36.8 C)  Resp: 20   Filed Weights   04/17/14 1445  Weight: 101 lb (45.813 kg)    GENERAL:alert, no distress and comfortable. She looks thin but not cachectic SKIN: skin color, texture, turgor are normal, no rashes or significant lesions EYES: normal, Conjunctiva are pink and non-injected, sclera clear OROPHARYNX:no exudate, no erythema and lips, buccal mucosa, and tongue normal  NEURO: alert & oriented x 3 with fluent speech, with a mild left facial drooping. LABORATORY DATA:  I have reviewed the data as listed Results for orders placed in visit on 04/17/14 (from the past 48 hour(s))  CBC & DIFF AND RETIC     Status: Abnormal   Collection Time  04/17/14  2:26 PM      Result Value Ref Range   WBC 6.5  3.9 - 10.3 10e3/uL   NEUT# 4.8  1.5 - 6.5 10e3/uL   HGB 11.5 (*) 11.6 - 15.9 g/dL   HCT 35.6  34.8 - 46.6 %   Platelets 262  145 - 400 10e3/uL   MCV 98.6  79.5 - 101.0 fL   MCH 31.9  25.1 - 34.0 pg   MCHC 32.3  31.5 - 36.0 g/dL   RBC 3.61 (*) 3.70 - 5.45 10e6/uL   RDW 13.9  11.2 - 14.5 %   lymph# 1.3  0.9 - 3.3 10e3/uL   MONO# 0.4  0.1 - 0.9 10e3/uL    Eosinophils Absolute 0.1  0.0 - 0.5 10e3/uL   Basophils Absolute 0.0  0.0 - 0.1 10e3/uL   NEUT% 72.7  38.4 - 76.8 %   LYMPH% 19.3  14.0 - 49.7 %   MONO% 6.0  0.0 - 14.0 %   EOS% 1.7  0.0 - 7.0 %   BASO% 0.3  0.0 - 2.0 %   Retic % 1.30  0.70 - 2.10 %   Retic Ct Abs 46.93  33.70 - 90.70 10e3/uL   Immature Retic Fract 6.20  1.60 - 10.00 %    Lab Results  Component Value Date   WBC 6.5 04/17/2014   HGB 11.5* 04/17/2014   HCT 35.6 04/17/2014   MCV 98.6 04/17/2014   PLT 262 04/17/2014    ASSESSMENT & PLAN:  Iron deficiency anemia due to chronic blood loss The patient is at high risk of bleeding due to duodenal angiodysplasia and aspirin therapy to prevent recurrence of stroke. The most likely cause of her anemia is due to chronic blood loss/malabsorption syndrome. We discussed some of the risks, benefits, and alternatives of intravenous iron infusions. The patient is symptomatic from anemia and the iron level is critically low. She tolerated oral iron supplement poorly and desires to achieved higher levels of iron faster for adequate hematopoesis. Some of the side-effects to be expected including risks of infusion reactions, phlebitis, headaches, nausea and fatigue.  The patient is willing to proceed. Patient education material was dispensed.  Goal is to keep ferritin level greater than 50   Acute left hemiparesis Clinically, she has diplopia and left facial weakness. I suspect she had acute stroke recently. It has been more than 3 weeks. She has multiple risk factors including hypertension, hyperlipidemia, diabetes and smoking. Even though she have recurrent GI bleeding in the past, I think the benefit of aspirin outweighs the risk. I recommend she start on half a tablet of aspirin. I will she continues followup with neurology consultation.      All questions were answered. The patient knows to call the clinic with any problems, questions or concerns. No barriers to learning was  detected.  I spent 25 minutes counseling the patient face to face. The total time spent in the appointment was 30 minutes and more than 50% was on counseling.     [facility], [place], MD 04/17/2014 3:13 PM

## 2014-04-17 NOTE — Assessment & Plan Note (Signed)
Clinically, she has diplopia and left facial weakness. I suspect she had acute stroke recently. It has been more than 3 weeks. She has multiple risk factors including hypertension, hyperlipidemia, diabetes and smoking. Even though she have recurrent GI bleeding in the past, I think the benefit of aspirin outweighs the risk. I recommend she start on half a tablet of aspirin. I will she continues followup with neurology consultation.

## 2014-04-18 ENCOUNTER — Telehealth: Payer: Self-pay | Admitting: Hematology and Oncology

## 2014-04-18 ENCOUNTER — Telehealth: Payer: Self-pay | Admitting: *Deleted

## 2014-04-18 NOTE — Telephone Encounter (Signed)
Per staff message I have called and resheduled appt for patent. Patient aware.

## 2014-04-18 NOTE — Telephone Encounter (Signed)
Per staff message and POF I have scheduled appts. Advised scheduler of appts. Appt moved from 10/22 to 10/23 due to no available.   JMW

## 2014-04-18 NOTE — Telephone Encounter (Signed)
S/w pt confirmed infusion change to 10/23 due to books closed for 10/22.... KJ

## 2014-04-19 DIAGNOSIS — J449 Chronic obstructive pulmonary disease, unspecified: Secondary | ICD-10-CM | POA: Diagnosis not present

## 2014-04-19 DIAGNOSIS — I10 Essential (primary) hypertension: Secondary | ICD-10-CM | POA: Diagnosis not present

## 2014-04-19 DIAGNOSIS — M542 Cervicalgia: Secondary | ICD-10-CM | POA: Diagnosis not present

## 2014-04-19 DIAGNOSIS — E039 Hypothyroidism, unspecified: Secondary | ICD-10-CM | POA: Diagnosis not present

## 2014-04-19 DIAGNOSIS — E785 Hyperlipidemia, unspecified: Secondary | ICD-10-CM | POA: Diagnosis not present

## 2014-04-19 DIAGNOSIS — E1159 Type 2 diabetes mellitus with other circulatory complications: Secondary | ICD-10-CM | POA: Diagnosis not present

## 2014-04-19 DIAGNOSIS — M6281 Muscle weakness (generalized): Secondary | ICD-10-CM | POA: Diagnosis not present

## 2014-04-19 DIAGNOSIS — D500 Iron deficiency anemia secondary to blood loss (chronic): Secondary | ICD-10-CM | POA: Diagnosis not present

## 2014-04-21 ENCOUNTER — Ambulatory Visit: Payer: Medicare Other

## 2014-04-24 ENCOUNTER — Ambulatory Visit (HOSPITAL_BASED_OUTPATIENT_CLINIC_OR_DEPARTMENT_OTHER): Payer: Medicare Other

## 2014-04-24 ENCOUNTER — Other Ambulatory Visit: Payer: Self-pay | Admitting: *Deleted

## 2014-04-24 VITALS — BP 116/47 | HR 90 | Temp 97.7°F | Resp 18

## 2014-04-24 DIAGNOSIS — D500 Iron deficiency anemia secondary to blood loss (chronic): Secondary | ICD-10-CM

## 2014-04-24 MED ORDER — FERUMOXYTOL INJECTION 510 MG/17 ML
1020.0000 mg | Freq: Once | INTRAVENOUS | Status: AC
  Administered 2014-04-24: 1020 mg via INTRAVENOUS
  Filled 2014-04-24: qty 34

## 2014-04-24 NOTE — Patient Instructions (Signed)

## 2014-06-02 DIAGNOSIS — E785 Hyperlipidemia, unspecified: Secondary | ICD-10-CM | POA: Diagnosis not present

## 2014-06-02 DIAGNOSIS — D500 Iron deficiency anemia secondary to blood loss (chronic): Secondary | ICD-10-CM | POA: Diagnosis not present

## 2014-06-02 DIAGNOSIS — I10 Essential (primary) hypertension: Secondary | ICD-10-CM | POA: Diagnosis not present

## 2014-06-02 DIAGNOSIS — E039 Hypothyroidism, unspecified: Secondary | ICD-10-CM | POA: Diagnosis not present

## 2014-06-02 DIAGNOSIS — M6281 Muscle weakness (generalized): Secondary | ICD-10-CM | POA: Diagnosis not present

## 2014-06-02 DIAGNOSIS — E1159 Type 2 diabetes mellitus with other circulatory complications: Secondary | ICD-10-CM | POA: Diagnosis not present

## 2014-06-02 DIAGNOSIS — M542 Cervicalgia: Secondary | ICD-10-CM | POA: Diagnosis not present

## 2014-06-02 DIAGNOSIS — J449 Chronic obstructive pulmonary disease, unspecified: Secondary | ICD-10-CM | POA: Diagnosis not present

## 2014-06-16 ENCOUNTER — Other Ambulatory Visit: Payer: Self-pay | Admitting: Neurology

## 2014-06-16 ENCOUNTER — Encounter: Payer: Self-pay | Admitting: Neurology

## 2014-06-16 ENCOUNTER — Ambulatory Visit (INDEPENDENT_AMBULATORY_CARE_PROVIDER_SITE_OTHER): Payer: Medicare Other | Admitting: Neurology

## 2014-06-16 VITALS — BP 118/66 | HR 89 | Ht 61.0 in | Wt 100.0 lb

## 2014-06-16 DIAGNOSIS — C50119 Malignant neoplasm of central portion of unspecified female breast: Secondary | ICD-10-CM

## 2014-06-16 DIAGNOSIS — M545 Low back pain, unspecified: Secondary | ICD-10-CM

## 2014-06-16 DIAGNOSIS — Z85038 Personal history of other malignant neoplasm of large intestine: Secondary | ICD-10-CM

## 2014-06-16 DIAGNOSIS — R269 Unspecified abnormalities of gait and mobility: Secondary | ICD-10-CM

## 2014-06-16 NOTE — Progress Notes (Signed)
PATIENT: Laura [name] DOB: [birth year]  HISTORICAL  Laura [name] is a 79 yo RH AAF, referred by Dr. Alvy Bimler for evaluation of stroke.  She has past medical history of diabetes since 1997, hypertension, hyperlipidemia, breast cancer, status post double mastectomy in 1999, followed by radiation therapy, also had a history of colon cancer, status post colectomy, no radiation, chemotherapy required  In December 2014, she developed severe anemia, due to GI bleeding, She had EGD and colonoscopy in 2014 which showed angioectasias and multiple benign polyps. She had blood transfusion, and iron infusion in May of 2014 and November 2014,with improvement of her hemoglobin, most recent hemoglobin 11.6, ferritin was 8 in September 2015.  She reported strokelike symptoms twice, the first one in 2014, she presented with sudden onset dysarthria, right arm, and leg weakness, gait difficulty, fell to ground, there was no workup in the system, she began to ambulate with a cane since.  The second episode was in September 7th 2015, she had acute worsening of facial droopy, right arm, and right leg weakness, gait difficulty,  In summer of 2015, she has developed right upper and lower face weakness, consistent with right Bell's palsy, only with mild improvement over the past few months  Over the past 3 years, she also complains of urinary stress incontinence  CAT scan of the brain showed moderate atrophy, small vessel disease  MRI cervical in 2012 showed multilevel degenerative disc disease, mild canal stenosis  [facility] at [facility] community, she is on the ground floor, using cane  UPDATE Dec 18th 2015: She came in earlier than expected, over the past few days, she had decreased intake, nauseous, in Sunday June 11 2014, she noticed worsening right facial weakness, felt swelling in her right face, more slurred speech,  She also complains of right-sided neck pain, unsteady gait,  She could  not tolerate closed MRI due to severe claustrophobia,   REVIEW OF SYSTEMS: Full 14 system review of systems performed and notable only for chill, fatigue, facial swelling, trouble swallowing, eye itching, light sensitivity, urinary incontinence, headaches, facial drooping, neck stiffness   ALLERGIES: Allergies  Allergen Reactions  . Diclofenac Nausea And Vomiting    HOME MEDICATIONS: Current Outpatient Prescriptions on File Prior to Visit  Medication Sig Dispense Refill  . ALPRAZolam (XANAX) 0.25 MG tablet Take 1 tablet (0.25 mg total) by mouth 3 (three) times daily as needed for sleep or anxiety. 30 tablet 0  . budesonide-formoterol (SYMBICORT) 160-4.5 MCG/ACT inhaler Inhale 2 puffs into the lungs 2 (two) times daily.    Marland Kitchen gabapentin (NEURONTIN) 100 MG capsule Take 100 mg by mouth 2 (two) times daily.    Marland Kitchen levothyroxine (SYNTHROID, LEVOTHROID) 50 MCG tablet Take 50 mcg by mouth daily.      Marland Kitchen lidocaine (LIDODERM) 5 %     . lisinopril (PRINIVIL,ZESTRIL) 5 MG tablet 5 mg daily.    . metFORMIN (GLUCOPHAGE) 1000 MG tablet Take 250 mg by mouth 2 (two) times daily with a meal.     . simvastatin (ZOCOR) 10 MG tablet Take 10 mg by mouth at bedtime.     . triamcinolone (NASACORT) 55 MCG/ACT AERO nasal inhaler      No current facility-administered medications on file prior to visit.    PAST MEDICAL HISTORY: Past Medical History  Diagnosis Date  . Type II diabetes mellitus     neuropathy  . Hypercholesteremia   . Hypertension   . Chronic obstructive bronchitis   . Anemia   .  Hypothyroidism   . Idiopathic peripheral neuropathy   . Muscle weakness (generalized)   . Myofacial muscle pain   . Depression   . Renal colic   . Hx of vaginal bleeding   . Hx of colonic polyps   . Iron deficiency anemia secondary to blood loss (chronic) 11/17/2012  . Iron deficiency anemia secondary to blood loss (chronic) 11/17/2012  . Breast cancer 2001    bilatera cancer; bilateral mastectomy; no chemo; 5  years of adjuvant hormonal therapy.  Was at [facility], [place].   . Cancer of colon 1999    UVA again, not sure stage I or II, no chemo recommended.   . Acute left hemiparesis 03/07/2014    PAST SURGICAL HISTORY: Past Surgical History  Procedure Laterality Date  . Partial colectomy    . Mastectomy      double  . Tonsillectomy    . Colon surgery    . Hernia repair      umblical hernia  . Enteroscopy N/A 11/25/2012    Procedure: ENTEROSCOPY;  Surgeon: Jerene Bears, MD;  Location: [facility];  Service: Gastroenterology;  Laterality: N/A;  with apc    FAMILY HISTORY: Family History  Problem Relation Age of Onset  . Breast cancer Maternal Aunt   . Breast cancer Daughter   . Lung cancer Brother     X2 died from  . Cancer Brother     cancer  . Lung cancer Sister     died from  . Cancer Mother 84    colon cancer    SOCIAL HISTORY:  History   Social History  . Marital Status: Widowed    Spouse Name: N/A    Number of Children: 12  . 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    Social History Main Topics  . Smoking status: Former Smoker -- 0.50 packs/day for 50 years    Types: Cigarettes    Quit date: 06/27/2012  . Smokeless tobacco: Never Used  . Alcohol Use: No  . Drug Use: No  . Sexual Activity: Not on file   Other Topics Concern  . Not on file   Social History Narrative  . No narrative on file     PHYSICAL EXAM   Filed Vitals:   06/16/14 1013  BP: 118/66  Pulse: 89  Height: 5\' 1"  (1.549 m)  Weight: 100 lb (45.36 kg)    Not recorded      Body mass index is 18.9 kg/(m^2).   Generalized: In no acute distress  Neck: Supple, no carotid bruits   Cardiac: Regular rate rhythm  Pulmonary: Clear to auscultation bilaterally  Musculoskeletal: No deformity  Neurological examination  Mentation: Alert oriented to time, place, history taking, and causual conversation  Cranial nerve  II-XII: Pupils were equal round reactive to light. Extraocular movements were full.  Visual field were full on confrontational test. Bilateral fundi were sharp.  Facial sensation was normal. She has right upper and lower face weakness, she could not close her right eye, only was able to cover the lower edge of right cornea. Significant right lower face weakness. Hearing was intact to finger rubbing bilaterally. Uvula tongue midline.  Head turning and shoulder shrug and were normal and symmetric.Tongue protrusion into cheek strength was normal.  Motor: Mild bilateral upper and lower extremity spasticity, no significant weakness.  Sensory: Intact to fine touch, pinprick, preserved vibratory sensation, and proprioception at toes.  Coordination:  Normal finger to nose, heel-to-shin bilaterally there was no truncal ataxia  Gait: Rising up from seated position pushing on a chair arm, stiff, cautious gait, dragging right leg across the floor  Deep tendon reflexes: Brachioradialis 3/3, biceps 3/3, triceps3/3, patellar 3/3, Achilles 3/3, plantar responses were extensor bilaterally.   DIAGNOSTIC DATA (LABS, IMAGING, TESTING) - I reviewed patient records, labs, notes, testing and imaging myself where available.  Lab Results  Component Value Date   WBC 6.5 04/17/2014   HGB 11.5* 04/17/2014   HCT 35.6 04/17/2014   MCV 98.6 04/17/2014   PLT 262 04/17/2014      Component Value Date/Time   NA 145 12/02/2013 1434   NA 142 11/28/2012 1149   K 4.0 12/02/2013 1434   K 3.9 11/28/2012 1149   CL 108 11/28/2012 1149   CL 109* 11/24/2012 0816   CO2 23 12/02/2013 1434   CO2 25 11/28/2012 1149   GLUCOSE 89 12/02/2013 1434   GLUCOSE 99 11/28/2012 1149   GLUCOSE 102* 11/24/2012 0816   BUN 11.5 12/02/2013 1434   BUN 11 11/28/2012 1149   CREATININE 0.7 12/02/2013 1434   CREATININE 0.63 11/28/2012 1149   CALCIUM 9.4 12/02/2013 1434   CALCIUM 9.6 11/28/2012 1149   PROT 7.0 12/02/2013 1434   ALBUMIN 4.1  12/02/2013 1434   AST 16 12/02/2013 1434   ALT 11 12/02/2013 1434   ALKPHOS 85 12/02/2013 1434   BILITOT 0.23 12/02/2013 1434   GFRNONAA 86* 11/28/2012 1149   GFRAA >90 11/28/2012 1149   No results found for: CHOL No results found for: HGBA1C No results found for: VITAMINB12 Lab Results  Component Value Date   TSH 1.156 12/02/2013    ASSESSMENT AND PLAN  Laura [name] is a 79 y.o. female with vascular risk factor of diabetes, hypertension, hyperlipidemia, presenting with worsening gait difficulty, strokelike symptoms, acute worsening of right-sided weakness, on examination, she has hyperreflexia, bilateral Babinski signs, previous history of cervical degenerative disc disease, she also has evidence of significant right upper and lower face weakness, consistent with her recent diagnosis of right Bell's palsy.  1. Differentiation diagnosis of her acute worsening gait difficulty, right-sided weakness including stroke, vs. cervical myelopathy 2. Complete evaluation with MRI of brain with and without, cervical spine, 3. Home physical therapy, return to clinic in 2 month    Marcial Pacas, M.D. Ph.D.  [facility] [address], [place], [place] [address] [phone]

## 2014-07-04 ENCOUNTER — Other Ambulatory Visit: Payer: Medicare Other

## 2014-07-05 ENCOUNTER — Telehealth: Payer: Self-pay | Admitting: Neurology

## 2014-07-05 NOTE — Telephone Encounter (Signed)
Patient was scheduled for Mri Brain w/wo and Mri Cervical wo 07/04/14 at [facility].  R Mansir cancelled appt on 06/21/14 due to the patient being claustrophobic and needs a open MRI.  On 07/03/14 Vickie at [facility] verified insurance and confirmed patients appt for 07/04/14 even though it was cancelled on 06/21/14.  Patient showed up for scans at [facility] 07/04/14 and had taken her medication for the claustrophobia.  [facility] refused to do scans because they were not on the schedule. Patient and patient's daughter were upset at the facility.  Mardene Celeste at [facility] called 07/04/14 and advised they could not fit patient in and rescheduled to 07/17/14.  R Mansir called [facility] today and let them know patient needs to be scheduled this week because they called the patient to come in to a cancelled appointment and patient had already taken her meds.  Vickie will advise with sooner appt.

## 2014-07-06 DIAGNOSIS — R269 Unspecified abnormalities of gait and mobility: Secondary | ICD-10-CM | POA: Diagnosis not present

## 2014-07-06 DIAGNOSIS — R2689 Other abnormalities of gait and mobility: Secondary | ICD-10-CM | POA: Diagnosis not present

## 2014-07-09 ENCOUNTER — Other Ambulatory Visit: Payer: Self-pay | Admitting: Neurology

## 2014-07-09 DIAGNOSIS — Z85038 Personal history of other malignant neoplasm of large intestine: Secondary | ICD-10-CM

## 2014-07-09 DIAGNOSIS — R269 Unspecified abnormalities of gait and mobility: Secondary | ICD-10-CM

## 2014-07-09 DIAGNOSIS — C50119 Malignant neoplasm of central portion of unspecified female breast: Secondary | ICD-10-CM

## 2014-07-10 ENCOUNTER — Telehealth: Payer: Self-pay | Admitting: Neurology

## 2014-07-10 NOTE — Telephone Encounter (Signed)
I have called and left message, chronic changes, will go over detail in her follow up visit in Feb 18th  Slightly abnormal MRI scan of the brain showing mild changes of chronic microvascular ischemia and supratentorial cortical atrophy.  Abnormal MRI scan of cervical spine showing mild spondylitic changes from C3-C6 with mild disc protusions centrally and ligamentum flavum hypertrophy resulting in mild canal narrowing but without significant compression or no significant change compared with previous MRI scan dated 06/14/2011.

## 2014-07-11 ENCOUNTER — Telehealth: Payer: Self-pay | Admitting: Neurology

## 2014-07-11 NOTE — Telephone Encounter (Signed)
Spoke with patients daughter and scheduled at [facility] 07/06/14 for both scans.  Patient aware of appointment time date and location [facility]

## 2014-07-12 ENCOUNTER — Ambulatory Visit (INDEPENDENT_AMBULATORY_CARE_PROVIDER_SITE_OTHER): Payer: Medicare Other | Admitting: Neurology

## 2014-07-12 ENCOUNTER — Encounter: Payer: Self-pay | Admitting: Neurology

## 2014-07-12 VITALS — BP 112/53 | HR 79 | Ht 61.0 in | Wt 100.0 lb

## 2014-07-12 DIAGNOSIS — R269 Unspecified abnormalities of gait and mobility: Secondary | ICD-10-CM

## 2014-07-12 DIAGNOSIS — G510 Bell's palsy: Secondary | ICD-10-CM | POA: Diagnosis not present

## 2014-07-12 DIAGNOSIS — M545 Low back pain, unspecified: Secondary | ICD-10-CM

## 2014-07-12 NOTE — Progress Notes (Signed)
PATIENT: Laura [name] DOB: 1935-05-23  HISTORICAL  Laura [name] is a 80 yo RH AAF, referred by Dr. Alvy Bimler for evaluation of stroke.  She has past medical history of diabetes since 1997, hypertension, hyperlipidemia, breast cancer, status post double mastectomy in 1999, followed by radiation therapy, also had a history of colon cancer, status post colectomy, no radiation, chemotherapy required  In December 2014, she developed severe anemia, due to GI bleeding, She had EGD and colonoscopy in 2014 which showed angioectasias and multiple benign polyps. She had blood transfusion, and iron infusion in May of 2014 and November 2014,with improvement of her hemoglobin, most recent hemoglobin 11.6, ferritin was 8 in September 2015.  She reported strokelike symptoms twice, the first one in 2014, she presented with sudden onset dysarthria, right arm, and leg weakness, gait difficulty, fell to ground, there was no workup in the system, she began to ambulate with a cane since.  The second episode was in September 7th 2015, she had acute worsening of facial droopy, right arm, and right leg weakness, gait difficulty,  In summer of 2015, she has developed right upper and lower face weakness, consistent with right Bell's palsy, only with mild improvement over the past few months  Over the past 3 years, she also complains of urinary stress incontinence  CAT scan of the brain showed moderate atrophy, small vessel disease  MRI cervical in 2012 showed multilevel degenerative disc disease, mild canal stenosis  [facility] community, she is on the ground floor, using cane  UPDATE Dec 18th 2015: She came in earlier than expected, over the past few days, she had decreased intake, nauseous, in Sunday June 11 2014, she noticed worsening right facial weakness, felt swelling in her right face, more slurred speech,  She also complains of right-sided neck pain, unsteady gait,  She could  not tolerate closed MRI due to severe claustrophobia,  Update July 13 2014: We have reviewed MRI of brain, mild atrophy, small vessel disease, no acute lesions MRI of cervical spine multilevel degenerative disc disease, mild multilevel canal stenosis, foraminal stenosis, no cord signal changes  She continue have low back pain, mild gait difficulty, using a cane, she has right facial weakness, difficulty closing her right eye, slurred speech,  REVIEW OF SYSTEMS: Full 14 system review of systems performed and notable only for chill, fatigue, facial swelling, trouble swallowing, eye itching, light sensitivity, urinary incontinence, headaches, facial drooping, neck stiffness   ALLERGIES: Allergies  Allergen Reactions  . Diclofenac Nausea And Vomiting    HOME MEDICATIONS: Current Outpatient Prescriptions on File Prior to Visit  Medication Sig Dispense Refill  . ALPRAZolam (XANAX) 0.25 MG tablet Take 1 tablet (0.25 mg total) by mouth 3 (three) times daily as needed for sleep or anxiety. 30 tablet 0  . budesonide-formoterol (SYMBICORT) 160-4.5 MCG/ACT inhaler Inhale 2 puffs into the lungs 2 (two) times daily.    Marland Kitchen gabapentin (NEURONTIN) 100 MG capsule Take 100 mg by mouth 2 (two) times daily.    Marland Kitchen levothyroxine (SYNTHROID, LEVOTHROID) 50 MCG tablet Take 50 mcg by mouth daily.      Marland Kitchen lidocaine (LIDODERM) 5 %     . lisinopril (PRINIVIL,ZESTRIL) 5 MG tablet 5 mg daily.    . metFORMIN (GLUCOPHAGE) 1000 MG tablet Take 250 mg by mouth 2 (two) times daily with a meal.     . simvastatin (ZOCOR) 10 MG tablet Take 10 mg by mouth at bedtime.     Marland Kitchen  triamcinolone (NASACORT) 55 MCG/ACT AERO nasal inhaler      No current facility-administered medications on file prior to visit.    PAST MEDICAL HISTORY: Past Medical History  Diagnosis Date  . Type II diabetes mellitus     neuropathy  . Hypercholesteremia   . Hypertension   . Chronic obstructive bronchitis   . Anemia   . Hypothyroidism   .  Idiopathic peripheral neuropathy   . Muscle weakness (generalized)   . Myofacial muscle pain   . Depression   . Renal colic   . Hx of vaginal bleeding   . Hx of colonic polyps   . Iron deficiency anemia secondary to blood loss (chronic) 11/17/2012  . Iron deficiency anemia secondary to blood loss (chronic) 11/17/2012  . Breast cancer 2001    bilatera cancer; bilateral mastectomy; no chemo; 5 years of adjuvant hormonal therapy.  Was at [facility] [place].   . Cancer of colon 1999    UVA again, not sure stage I or II, no chemo recommended.   . Acute left hemiparesis 03/07/2014    PAST SURGICAL HISTORY: Past Surgical History  Procedure Laterality Date  . Partial colectomy    . Mastectomy      double  . Tonsillectomy    . Colon surgery    . Hernia repair      umblical hernia  . Enteroscopy N/A 11/25/2012    Procedure: ENTEROSCOPY;  Surgeon: Jerene Bears, MD;  Location: [facility];  Service: Gastroenterology;  Laterality: N/A;  with apc    FAMILY HISTORY: Family History  Problem Relation Age of Onset  . Breast cancer Maternal Aunt   . Breast cancer Daughter   . Lung cancer Brother     X2 died from  . Cancer Brother     cancer  . Lung cancer Sister     died from  . Cancer Mother 50    colon cancer    SOCIAL HISTORY:  History   Social History  . Marital Status: Widowed    Spouse Name: N/A    Number of Children: 69  . 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    Social History Main Topics  . Smoking status: Former Smoker -- 0.50 packs/day for 50 years    Types: Cigarettes    Quit date: 06/27/2012  . Smokeless tobacco: Never Used  . Alcohol Use: No  . Drug Use: No  . Sexual Activity: Not on file   Other Topics Concern  . Not on file   Social History Narrative  . No narrative on file     PHYSICAL EXAM   Filed Vitals:   07/12/14 1129  BP: 112/53  Pulse: 79  Height: 5\' 1"  (1.549 m)  Weight:  100 lb (45.36 kg)    Not recorded      Body mass index is 18.9 kg/(m^2).   Generalized: In no acute distress  Neck: Supple, no carotid bruits   Cardiac: Regular rate rhythm  Pulmonary: Clear to auscultation bilaterally  Musculoskeletal: No deformity  Neurological examination  Mentation: Alert oriented to time, place, history taking, and causual conversation  Cranial nerve II-XII: Pupils were equal round reactive to light. Extraocular movements were full.  Visual field were full on confrontational test. Bilateral fundi were sharp.  Facial sensation was normal. She has right upper and lower face weakness, she could not close her right eye, only was able to cover the lower  edge of right cornea. Significant right lower face weakness. Hearing was intact to finger rubbing bilaterally. Uvula tongue midline.  Head turning and shoulder shrug and were normal and symmetric.Tongue protrusion into cheek strength was normal.  Motor: Mild bilateral upper and lower extremity spasticity, no significant weakness.  Sensory: Intact to fine touch, pinprick, preserved vibratory sensation, and proprioception at toes.  Coordination: Normal finger to nose, heel-to-shin bilaterally there was no truncal ataxia  Gait: Rising up from seated position pushing on a chair arm, stiff, cautious gait, was able to stand on tiptoe, heel  Deep tendon reflexes: Brachioradialis 3/3, biceps 3/3, triceps3/3, patellar 3/3, Achilles 3/3, plantar responses were extensor bilaterally.   DIAGNOSTIC DATA (LABS, IMAGING, TESTING) - I reviewed patient records, labs, notes, testing and imaging myself where available.  Lab Results  Component Value Date   WBC 6.5 04/17/2014   HGB 11.5* 04/17/2014   HCT 35.6 04/17/2014   MCV 98.6 04/17/2014   PLT 262 04/17/2014      Component Value Date/Time   NA 145 12/02/2013 1434   NA 142 11/28/2012 1149   K 4.0 12/02/2013 1434   K 3.9 11/28/2012 1149   CL 108 11/28/2012 1149   CL  109* 11/24/2012 0816   CO2 23 12/02/2013 1434   CO2 25 11/28/2012 1149   GLUCOSE 89 12/02/2013 1434   GLUCOSE 99 11/28/2012 1149   GLUCOSE 102* 11/24/2012 0816   BUN 11.5 12/02/2013 1434   BUN 11 11/28/2012 1149   CREATININE 0.7 12/02/2013 1434   CREATININE 0.63 11/28/2012 1149   CALCIUM 9.4 12/02/2013 1434   CALCIUM 9.6 11/28/2012 1149   PROT 7.0 12/02/2013 1434   ALBUMIN 4.1 12/02/2013 1434   AST 16 12/02/2013 1434   ALT 11 12/02/2013 1434   ALKPHOS 85 12/02/2013 1434   BILITOT 0.23 12/02/2013 1434   GFRNONAA 86* 11/28/2012 1149   GFRAA >90 11/28/2012 1149   No results found for: CHOL No results found for: HGBA1C No results found for: VITAMINB12 Lab Results  Component Value Date   TSH 1.156 12/02/2013    ASSESSMENT AND PLAN  Laura [name] is a 80 y.o. female with vascular risk factor of diabetes, hypertension, hyperlipidemia, presenting with worsening gait difficulty, strokelike symptoms, acute worsening of right-sided weakness, on examination, she has hyperreflexia, bilateral Babinski signs, previous history of cervical degenerative disc disease, she also has evidence of significant right upper and lower face weakness, consistent with her recent diagnosis of right Bell's palsy. Repeat MRI of brain, cervical spine, showed chronic changes, but no evidence of new stroke,  She is continue moderate exercise, Return to clinic as needed Home health physical therapy refer   No orders of the defined types were placed in this encounter.    Return if symptoms worsen or fail to improve.   Marcial Pacas, M.D. Ph.D.  [facility] [address] [place], [place] [address] [phone]

## 2014-07-17 ENCOUNTER — Other Ambulatory Visit: Payer: Medicare Other

## 2014-08-17 ENCOUNTER — Ambulatory Visit: Payer: Medicare Other | Admitting: Neurology

## 2014-08-18 DIAGNOSIS — E039 Hypothyroidism, unspecified: Secondary | ICD-10-CM | POA: Diagnosis not present

## 2014-08-18 DIAGNOSIS — D500 Iron deficiency anemia secondary to blood loss (chronic): Secondary | ICD-10-CM | POA: Diagnosis not present

## 2014-08-18 DIAGNOSIS — J449 Chronic obstructive pulmonary disease, unspecified: Secondary | ICD-10-CM | POA: Diagnosis not present

## 2014-08-18 DIAGNOSIS — E785 Hyperlipidemia, unspecified: Secondary | ICD-10-CM | POA: Diagnosis not present

## 2014-08-18 DIAGNOSIS — M6281 Muscle weakness (generalized): Secondary | ICD-10-CM | POA: Diagnosis not present

## 2014-08-18 DIAGNOSIS — E1159 Type 2 diabetes mellitus with other circulatory complications: Secondary | ICD-10-CM | POA: Diagnosis not present

## 2014-08-18 DIAGNOSIS — I10 Essential (primary) hypertension: Secondary | ICD-10-CM | POA: Diagnosis not present

## 2014-08-18 DIAGNOSIS — M542 Cervicalgia: Secondary | ICD-10-CM | POA: Diagnosis not present

## 2014-09-22 DIAGNOSIS — M6281 Muscle weakness (generalized): Secondary | ICD-10-CM | POA: Diagnosis not present

## 2014-09-22 DIAGNOSIS — D500 Iron deficiency anemia secondary to blood loss (chronic): Secondary | ICD-10-CM | POA: Diagnosis not present

## 2014-09-22 DIAGNOSIS — M542 Cervicalgia: Secondary | ICD-10-CM | POA: Diagnosis not present

## 2014-09-22 DIAGNOSIS — I10 Essential (primary) hypertension: Secondary | ICD-10-CM | POA: Diagnosis not present

## 2014-09-22 DIAGNOSIS — E1159 Type 2 diabetes mellitus with other circulatory complications: Secondary | ICD-10-CM | POA: Diagnosis not present

## 2014-09-22 DIAGNOSIS — E785 Hyperlipidemia, unspecified: Secondary | ICD-10-CM | POA: Diagnosis not present

## 2014-09-22 DIAGNOSIS — J449 Chronic obstructive pulmonary disease, unspecified: Secondary | ICD-10-CM | POA: Diagnosis not present

## 2014-09-22 DIAGNOSIS — E039 Hypothyroidism, unspecified: Secondary | ICD-10-CM | POA: Diagnosis not present

## 2014-10-10 ENCOUNTER — Telehealth: Payer: Self-pay | Admitting: Hematology and Oncology

## 2014-10-10 NOTE — Telephone Encounter (Signed)
pt daughter called to r/s appt due to not being in town...done...she is aware of new d.t

## 2014-10-17 ENCOUNTER — Other Ambulatory Visit: Payer: Medicare Other

## 2014-10-17 ENCOUNTER — Ambulatory Visit: Payer: Medicare Other | Admitting: Hematology and Oncology

## 2014-11-15 ENCOUNTER — Telehealth: Payer: Self-pay | Admitting: *Deleted

## 2014-11-15 ENCOUNTER — Encounter: Payer: Self-pay | Admitting: Hematology and Oncology

## 2014-11-15 ENCOUNTER — Other Ambulatory Visit (HOSPITAL_BASED_OUTPATIENT_CLINIC_OR_DEPARTMENT_OTHER): Payer: Medicare Other

## 2014-11-15 ENCOUNTER — Telehealth: Payer: Self-pay | Admitting: Hematology and Oncology

## 2014-11-15 ENCOUNTER — Ambulatory Visit (HOSPITAL_BASED_OUTPATIENT_CLINIC_OR_DEPARTMENT_OTHER): Payer: Medicare Other | Admitting: Hematology and Oncology

## 2014-11-15 VITALS — BP 122/36 | HR 68 | Temp 97.6°F | Resp 18 | Ht 61.0 in | Wt 100.3 lb

## 2014-11-15 DIAGNOSIS — D500 Iron deficiency anemia secondary to blood loss (chronic): Secondary | ICD-10-CM | POA: Diagnosis not present

## 2014-11-15 DIAGNOSIS — G510 Bell's palsy: Secondary | ICD-10-CM | POA: Diagnosis not present

## 2014-11-15 DIAGNOSIS — G8194 Hemiplegia, unspecified affecting left nondominant side: Secondary | ICD-10-CM

## 2014-11-15 DIAGNOSIS — H9193 Unspecified hearing loss, bilateral: Secondary | ICD-10-CM | POA: Diagnosis not present

## 2014-11-15 HISTORY — DX: Unspecified hearing loss, bilateral: H91.93

## 2014-11-15 LAB — CBC & DIFF AND RETIC
BASO%: 0.2 % (ref 0.0–2.0)
Basophils Absolute: 0 10*3/uL (ref 0.0–0.1)
EOS%: 1.7 % (ref 0.0–7.0)
Eosinophils Absolute: 0.1 10*3/uL (ref 0.0–0.5)
HEMATOCRIT: 37 % (ref 34.8–46.6)
HGB: 12.1 g/dL (ref 11.6–15.9)
Immature Retic Fract: 3.9 % (ref 1.60–10.00)
LYMPH%: 18.6 % (ref 14.0–49.7)
MCH: 32.6 pg (ref 25.1–34.0)
MCHC: 32.7 g/dL (ref 31.5–36.0)
MCV: 99.7 fL (ref 79.5–101.0)
MONO#: 0.4 10*3/uL (ref 0.1–0.9)
MONO%: 7.2 % (ref 0.0–14.0)
NEUT#: 4.4 10*3/uL (ref 1.5–6.5)
NEUT%: 72.3 % (ref 38.4–76.8)
PLATELETS: 307 10*3/uL (ref 145–400)
RBC: 3.71 10*6/uL (ref 3.70–5.45)
RDW: 13.2 % (ref 11.2–14.5)
Retic %: 1.33 % (ref 0.70–2.10)
Retic Ct Abs: 49.34 10*3/uL (ref 33.70–90.70)
WBC: 6 10*3/uL (ref 3.9–10.3)
lymph#: 1.1 10*3/uL (ref 0.9–3.3)

## 2014-11-15 LAB — FERRITIN CHCC: Ferritin: 16 ng/ml (ref 9–269)

## 2014-11-15 NOTE — Telephone Encounter (Signed)
Gave and printed appt sched anda vs for pt for NOV.Marland Kitchenthe patient sched to see Dr. Constance Holster on 5.26 @ 1pm

## 2014-11-15 NOTE — Telephone Encounter (Signed)
Informed pt of Dr. Calton Dach note below.  Instructed her to take iron supplement at bedtime.  Pt states it has upset her stomach in the past.  Instructed that taking at bedtime can lessen this side effect, but if it upsets her stomach trying taking every other night and if it still upsets stomach then to let us know.  She verbalized understanding.

## 2014-11-15 NOTE — Telephone Encounter (Signed)
-----   Message from Heath Lark, MD sent at 11/15/2014  2:13 PM EDT ----- Regarding: iron test Pls let her know iron test is still a little low Take OTC 1 tab iron supplement at night ----- Message -----    From: [facility] Interface    Sent: 11/15/2014   9:25 AM      To: Heath Lark, MD

## 2014-11-16 DIAGNOSIS — G510 Bell's palsy: Secondary | ICD-10-CM | POA: Insufficient documentation

## 2014-11-16 NOTE — Progress Notes (Signed)
[place], MD SUMMARY OF HEMATOLOGIC HISTORY: This is a pleasant lady with background history of chronic GI bleed. She had remote history of colon cancer diagnosed in 1999 status post resection. She also had remote history of breast cancer diagnosed in 2000, neither records were available. She had EGD and colonoscopy in 2014 which showed angioectasias and multiple benign polyps. In May of 2014, November 2014, March 2015 and November 2015, she received intravenous iron infusion with improvement of her hemoglobin. INTERVAL HISTORY: Laura [name] 80 y.o. female returns for further follow-up. She complained of fatigue. She have persistent Bell's positive with no improvement and it is bothering her. She complained of bilateral hearing loss.  The patient denies any recent signs or symptoms of bleeding such as spontaneous epistaxis, hematuria or hematochezia.   I have reviewed the past medical history, past surgical history, social history and family history with the patient and they are unchanged from previous note.  ALLERGIES:  is allergic to diclofenac.  MEDICATIONS:  Current Outpatient Prescriptions  Medication Sig Dispense Refill  . ALPRAZolam (XANAX) 0.5 MG tablet     . budesonide-formoterol (SYMBICORT) 160-4.5 MCG/ACT inhaler Inhale 2 puffs into the lungs 2 (two) times daily.    Marland Kitchen gabapentin (NEURONTIN) 100 MG capsule Take 100 mg by mouth 2 (two) times daily.    Marland Kitchen levothyroxine (SYNTHROID, LEVOTHROID) 50 MCG tablet Take 50 mcg by mouth daily.      Marland Kitchen lidocaine (LIDODERM) 5 %     . lisinopril (PRINIVIL,ZESTRIL) 5 MG tablet 5 mg daily.    . metFORMIN (GLUCOPHAGE) 1000 MG tablet Take 250 mg by mouth 2 (two) times daily with a meal.     . simvastatin (ZOCOR) 10 MG tablet Take 10 mg by mouth at bedtime.     . triamcinolone (NASACORT) 55 MCG/ACT AERO nasal inhaler     . ALPRAZolam (XANAX) 0.25 MG tablet Take 1 tablet (0.25 mg total) by mouth  3 (three) times daily as needed for sleep or anxiety. (Patient not taking: Reported on 11/15/2014) 30 tablet 0   No current facility-administered medications for this visit.     REVIEW OF SYSTEMS:   Constitutional: Denies fevers, chills or night sweats Eyes: Denies blurriness of vision Ears, nose, mouth, throat, and face: Denies mucositis or sore throat Respiratory: Denies cough, dyspnea or wheezes Cardiovascular: Denies palpitation, chest discomfort or lower extremity swelling Gastrointestinal:  Denies nausea, heartburn or change in bowel habits Skin: Denies abnormal skin rashes Lymphatics: Denies new lymphadenopathy or easy bruising Neurological:Denies numbness, tingling or new weaknesses Behavioral/Psych: Mood is stable, no new changes  All other systems were reviewed with the patient and are negative.  PHYSICAL EXAMINATION: ECOG PERFORMANCE STATUS: 0 - Asymptomatic  Filed Vitals:   11/15/14 1000  BP: 122/36  Pulse: 68  Temp: 97.6 F (36.4 C)  Resp: 18   Filed Weights   11/15/14 1000  Weight: 100 lb 4.8 oz (45.496 kg)    GENERAL:alert, no distress and comfortable SKIN: skin color, texture, turgor are normal, no rashes or significant lesions EYES: normal, Conjunctiva are pink and non-injected, sclera clear OROPHARYNX:no exudate, no erythema and lips, buccal mucosa, and tongue normal  NECK: supple, thyroid normal size, non-tender, without nodularity LYMPH:  no palpable lymphadenopathy in the cervical, axillary or inguinal LUNGS: clear to auscultation and percussion with normal breathing effort HEART: regular rate & rhythm and no murmurs and no lower extremity edema ABDOMEN:abdomen soft, non-tender and normal bowel sounds  Musculoskeletal:no cyanosis of digits and no clubbing  NEURO: alert & oriented x 3 with slightly slurred speech, consistent with right-sided Bell's palsy  LABORATORY DATA:  I have reviewed the data as listed Results for orders placed or performed in  visit on 11/15/14 (from the past 48 hour(s))  CBC & Diff and Retic     Status: None   Collection Time: 11/15/14  9:14 AM  Result Value Ref Range   WBC 6.0 3.9 - 10.3 10e3/uL   NEUT# 4.4 1.5 - 6.5 10e3/uL   HGB 12.1 11.6 - 15.9 g/dL   HCT 37.0 34.8 - 46.6 %   Platelets 307 145 - 400 10e3/uL   MCV 99.7 79.5 - 101.0 fL   MCH 32.6 25.1 - 34.0 pg   MCHC 32.7 31.5 - 36.0 g/dL   RBC 3.71 3.70 - 5.45 10e6/uL   RDW 13.2 11.2 - 14.5 %   lymph# 1.1 0.9 - 3.3 10e3/uL   MONO# 0.4 0.1 - 0.9 10e3/uL   Eosinophils Absolute 0.1 0.0 - 0.5 10e3/uL   Basophils Absolute 0.0 0.0 - 0.1 10e3/uL   NEUT% 72.3 38.4 - 76.8 %   LYMPH% 18.6 14.0 - 49.7 %   MONO% 7.2 0.0 - 14.0 %   EOS% 1.7 0.0 - 7.0 %   BASO% 0.2 0.0 - 2.0 %   Retic % 1.33 0.70 - 2.10 %   Retic Ct Abs 49.34 33.70 - 90.70 10e3/uL   Immature Retic Fract 3.90 1.60 - 10.00 %  Ferritin     Status: None   Collection Time: 11/15/14  9:14 AM  Result Value Ref Range   Ferritin 16 9 - 269 ng/ml    Lab Results  Component Value Date   WBC 6.0 11/15/2014   HGB 12.1 11/15/2014   HCT 37.0 11/15/2014   MCV 99.7 11/15/2014   PLT 307 11/15/2014    RADIOGRAPHIC STUDIES: I reviewed her recent MRI I have personally reviewed the radiological images as listed and agreed with the findings in the report.  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 The most likely cause of her anemia is due to chronic blood loss/malabsorption syndrome. I recommend she start oral iron supplement. Even though she is not anemic, she would be at risk. I will recheck that in 6 months.     Bilateral hearing loss She has bilateral hearing loss. Recent imaging studies show no evidence of cranial metastasis. Recommend ENT consult.   Right-sided Bell's palsy She was seen by neurologist and was diagnosed with Bell's  palsy. According to the patient, it is worse. 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    All questions were answered. The patient knows to call the clinic with any problems, questions or concerns. No barriers to learning was detected.  I spent 15 minutes counseling the patient face to face. The total time spent in the appointment was 20 minutes and more than 50% was on counseling.     [facility], Rejeana Fadness, MD 5/19/201612:18 PM

## 2014-11-16 NOTE — Assessment & Plan Note (Signed)
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 The most likely cause of her anemia is due to chronic blood loss/malabsorption syndrome. I recommend she start oral iron supplement. Even though she is not anemic, she would be at risk. I will recheck that in 6 months.

## 2014-11-16 NOTE — Assessment & Plan Note (Signed)
She was seen by neurologist and was diagnosed with Bell's palsy. According to the patient, it is worse. 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

## 2014-11-16 NOTE — Assessment & Plan Note (Signed)
She has bilateral hearing loss. Recent imaging studies show no evidence of cranial metastasis. Recommend ENT consult.

## 2014-11-23 DIAGNOSIS — Z8669 Personal history of other diseases of the nervous system and sense organs: Secondary | ICD-10-CM | POA: Diagnosis not present

## 2014-11-23 DIAGNOSIS — H9191 Unspecified hearing loss, right ear: Secondary | ICD-10-CM | POA: Diagnosis not present

## 2014-11-23 DIAGNOSIS — H903 Sensorineural hearing loss, bilateral: Secondary | ICD-10-CM | POA: Diagnosis not present

## 2014-11-23 DIAGNOSIS — H9113 Presbycusis, bilateral: Secondary | ICD-10-CM | POA: Diagnosis not present

## 2014-12-15 DIAGNOSIS — M6281 Muscle weakness (generalized): Secondary | ICD-10-CM | POA: Diagnosis not present

## 2014-12-15 DIAGNOSIS — J449 Chronic obstructive pulmonary disease, unspecified: Secondary | ICD-10-CM | POA: Diagnosis not present

## 2014-12-15 DIAGNOSIS — F17210 Nicotine dependence, cigarettes, uncomplicated: Secondary | ICD-10-CM | POA: Diagnosis not present

## 2014-12-15 DIAGNOSIS — J019 Acute sinusitis, unspecified: Secondary | ICD-10-CM | POA: Diagnosis not present

## 2014-12-15 DIAGNOSIS — E039 Hypothyroidism, unspecified: Secondary | ICD-10-CM | POA: Diagnosis not present

## 2014-12-15 DIAGNOSIS — E785 Hyperlipidemia, unspecified: Secondary | ICD-10-CM | POA: Diagnosis not present

## 2014-12-15 DIAGNOSIS — I10 Essential (primary) hypertension: Secondary | ICD-10-CM | POA: Diagnosis not present

## 2014-12-15 DIAGNOSIS — E1159 Type 2 diabetes mellitus with other circulatory complications: Secondary | ICD-10-CM | POA: Diagnosis not present

## 2015-01-19 DIAGNOSIS — M6281 Muscle weakness (generalized): Secondary | ICD-10-CM | POA: Diagnosis not present

## 2015-01-19 DIAGNOSIS — E1159 Type 2 diabetes mellitus with other circulatory complications: Secondary | ICD-10-CM | POA: Diagnosis not present

## 2015-01-19 DIAGNOSIS — J449 Chronic obstructive pulmonary disease, unspecified: Secondary | ICD-10-CM | POA: Diagnosis not present

## 2015-01-19 DIAGNOSIS — E039 Hypothyroidism, unspecified: Secondary | ICD-10-CM | POA: Diagnosis not present

## 2015-01-19 DIAGNOSIS — D500 Iron deficiency anemia secondary to blood loss (chronic): Secondary | ICD-10-CM | POA: Diagnosis not present

## 2015-01-19 DIAGNOSIS — H269 Unspecified cataract: Secondary | ICD-10-CM | POA: Diagnosis not present

## 2015-01-19 DIAGNOSIS — I10 Essential (primary) hypertension: Secondary | ICD-10-CM | POA: Diagnosis not present

## 2015-01-19 DIAGNOSIS — E785 Hyperlipidemia, unspecified: Secondary | ICD-10-CM | POA: Diagnosis not present

## 2015-04-05 ENCOUNTER — Other Ambulatory Visit: Payer: Self-pay | Admitting: Hematology and Oncology

## 2015-04-05 ENCOUNTER — Telehealth: Payer: Self-pay | Admitting: *Deleted

## 2015-04-05 DIAGNOSIS — C187 Malignant neoplasm of sigmoid colon: Secondary | ICD-10-CM

## 2015-04-05 DIAGNOSIS — Z23 Encounter for immunization: Secondary | ICD-10-CM | POA: Diagnosis not present

## 2015-04-05 NOTE — Telephone Encounter (Signed)
Pt will come in tomorrow at 10:00 for lab, see Dr Alvy Bimler at 10:30

## 2015-04-05 NOTE — Telephone Encounter (Signed)
I recommend seeing her in the office for eval with lab appt first I can see her tomorrow at 1030 or Monday at 1015, 1145, 1215 or 3 pm Please call and send POF

## 2015-04-05 NOTE — Telephone Encounter (Signed)
Patient called.  She had her blood pressure checked at the [facility] where she lived by [employer] RN ([facility] [employer].  Her blood pressure values were 101/39 and 98/37.  Nurse was concerned and suggested she call her doctor.  Her PCP is Dr. Reymundo Poll with [facility].  They do not have an office, but have been coming to her home for 10 years.  They have shut down without any notice to her, so she is calling us.  She also does not have a cardiologist.  Her other sx are that she is tired with no energy.  She does not feel dizzy.  Her next appt. With Dr. Alvy Bimler is 05/18/15. She is asking if she needs to be seen sooner because of her blood pressure?   Encouraged patient to drink plenty of non-caffeine liquids in the meantime.  Requested that she try and drink 50-60 oz a day - which she said sounded like a lot and she states she is probably not drinking as much as she should.  Her call back is [phone]

## 2015-04-06 ENCOUNTER — Other Ambulatory Visit (HOSPITAL_BASED_OUTPATIENT_CLINIC_OR_DEPARTMENT_OTHER): Payer: Medicare Other

## 2015-04-06 ENCOUNTER — Telehealth: Payer: Self-pay | Admitting: Hematology and Oncology

## 2015-04-06 ENCOUNTER — Ambulatory Visit (HOSPITAL_BASED_OUTPATIENT_CLINIC_OR_DEPARTMENT_OTHER): Payer: Medicare Other | Admitting: Hematology and Oncology

## 2015-04-06 ENCOUNTER — Ambulatory Visit (HOSPITAL_BASED_OUTPATIENT_CLINIC_OR_DEPARTMENT_OTHER): Payer: Medicare Other

## 2015-04-06 VITALS — BP 131/53 | HR 95 | Temp 97.9°F | Resp 20 | Ht 61.0 in | Wt 100.3 lb

## 2015-04-06 VITALS — BP 138/42 | HR 86

## 2015-04-06 DIAGNOSIS — G8194 Hemiplegia, unspecified affecting left nondominant side: Secondary | ICD-10-CM

## 2015-04-06 DIAGNOSIS — D500 Iron deficiency anemia secondary to blood loss (chronic): Secondary | ICD-10-CM | POA: Diagnosis not present

## 2015-04-06 DIAGNOSIS — I9589 Other hypotension: Secondary | ICD-10-CM | POA: Diagnosis not present

## 2015-04-06 DIAGNOSIS — C187 Malignant neoplasm of sigmoid colon: Secondary | ICD-10-CM

## 2015-04-06 DIAGNOSIS — K31811 Angiodysplasia of stomach and duodenum with bleeding: Secondary | ICD-10-CM

## 2015-04-06 DIAGNOSIS — R58 Hemorrhage, not elsewhere classified: Secondary | ICD-10-CM

## 2015-04-06 LAB — FERRITIN CHCC: Ferritin: 6 ng/ml — ABNORMAL LOW (ref 9–269)

## 2015-04-06 LAB — COMPREHENSIVE METABOLIC PANEL (CC13)
ALT: 9 U/L (ref 0–55)
AST: 16 U/L (ref 5–34)
Albumin: 3.8 g/dL (ref 3.5–5.0)
Alkaline Phosphatase: 96 U/L (ref 40–150)
Anion Gap: 6 mEq/L (ref 3–11)
BUN: 8.6 mg/dL (ref 7.0–26.0)
CO2: 28 mEq/L (ref 22–29)
Calcium: 9.3 mg/dL (ref 8.4–10.4)
Chloride: 106 mEq/L (ref 98–109)
Creatinine: 0.7 mg/dL (ref 0.6–1.1)
EGFR: >90 mL/min/{1.73_m2} (ref >90)
GLUCOSE: 80 mg/dL (ref 70–140)
Potassium: 3.7 mEq/L (ref 3.5–5.1)
Sodium: 141 mEq/L (ref 136–145)
TOTAL PROTEIN: 7 g/dL (ref 6.4–8.3)

## 2015-04-06 LAB — CBC & DIFF AND RETIC
BASO%: 0.3 % (ref 0.0–2.0)
Basophils Absolute: 0 10*3/uL (ref 0.0–0.1)
EOS ABS: 0.1 10*3/uL (ref 0.0–0.5)
EOS%: 1.3 % (ref 0.0–7.0)
HCT: 29.2 % — ABNORMAL LOW (ref 34.8–46.6)
HEMOGLOBIN: 8.9 g/dL — AB (ref 11.6–15.9)
IMMATURE RETIC FRACT: 14.5 % — AB (ref 1.60–10.00)
LYMPH#: 0.7 10*3/uL — AB (ref 0.9–3.3)
LYMPH%: 9.7 % — AB (ref 14.0–49.7)
MCH: 26.6 pg (ref 25.1–34.0)
MCHC: 30.5 g/dL — ABNORMAL LOW (ref 31.5–36.0)
MCV: 87.2 fL (ref 79.5–101.0)
MONO#: 0.5 10*3/uL (ref 0.1–0.9)
MONO%: 7.8 % (ref 0.0–14.0)
NEUT%: 80.9 % — ABNORMAL HIGH (ref 38.4–76.8)
NEUTROS ABS: 5.4 10*3/uL (ref 1.5–6.5)
Platelets: 305 10*3/uL (ref 145–400)
RBC: 3.35 10*6/uL — ABNORMAL LOW (ref 3.70–5.45)
RDW: 14.8 % — ABNORMAL HIGH (ref 11.2–14.5)
RETIC CT ABS: 31.83 10*3/uL — AB (ref 33.70–90.70)
Retic %: 0.95 % (ref 0.70–2.10)
WBC: 6.7 10*3/uL (ref 3.9–10.3)

## 2015-04-06 MED ORDER — SODIUM CHLORIDE 0.9 % IV SOLN
Freq: Once | INTRAVENOUS | Status: AC
  Administered 2015-04-06: 12:00:00 via INTRAVENOUS

## 2015-04-06 MED ORDER — OMEPRAZOLE 40 MG PO CPDR: 40.0000 mg | DELAYED_RELEASE_CAPSULE | Freq: Every day | ORAL | Status: DC

## 2015-04-06 MED ORDER — SODIUM CHLORIDE 0.9 % IV SOLN
510.0000 mg | Freq: Once | INTRAVENOUS | Status: AC
  Administered 2015-04-06: 510 mg via INTRAVENOUS
  Filled 2015-04-06: qty 17

## 2015-04-06 NOTE — Patient Instructions (Signed)

## 2015-04-06 NOTE — Assessment & Plan Note (Signed)
The patient is at high risk of bleeding due to duodenal angiodysplasia and is on aspirin therapy to prevent recurrence of stroke. The most likely cause of her anemia is due to chronic blood loss/malabsorption syndrome. We discussed some of the risks, benefits, and alternatives of intravenous iron infusions. The patient is symptomatic from anemia and the iron level is critically low. She tolerated oral iron supplement poorly and desires to achieved higher levels of iron faster for adequate hematopoesis. Some of the side-effects to be expected including risks of infusion reactions, phlebitis, headaches, nausea and fatigue.  The patient is willing to proceed. Patient education material was dispensed.  Goal is to keep ferritin level greater than 50 She would need urgent GI consult. I recommend she discontinue all NSAID. She will continue multivitamin and vitamin B 12 supplement to assist in ineffective erythropoiesis

## 2015-04-06 NOTE — Assessment & Plan Note (Signed)
I suspect she have hypotension due to slow GI bleed. I recommend discontinuation of lisinopril. She has lost her primary care doctor recently. I recommended another physician to see her for chronic blood pressure management judgment I will refer her back to GI for evaluation and will reassess her blood count in a few weeks

## 2015-04-06 NOTE — Progress Notes (Signed)
[place] OFFICE PROGRESS NOTE  Patient Care Team: Reymundo Poll, MD as PCP - General (Family Medicine) Jerene Bears, MD as Attending Physician (Gastroenterology) Heath Lark, MD as Consulting Physician (Hematology and Oncology)  SUMMARY OF ONCOLOGIC HISTORY:  This is a pleasant lady with background history of chronic GI bleed. She had remote history of colon cancer diagnosed in 1999 status post resection. She also had remote history of breast cancer diagnosed in 2000, neither records were available. She had EGD and colonoscopy in 2014 which showed angioectasias and multiple benign polyps. In May of 2014, November 2014, March 2015 and November 2015, she received intravenous iron infusion with improvement of her hemoglobin.  INTERVAL HISTORY: Please see below for problem oriented charting. She is seen urgently today because of complaints of fatigue, dizziness and low blood pressure. The patient admits she had taken some NSAID-containing products recently. The patient denies any recent signs or symptoms of bleeding such as spontaneous epistaxis, hematuria or hematochezia.  REVIEW OF SYSTEMS:   Constitutional: Denies fevers, chills or abnormal weight loss Eyes: Denies blurriness of vision Ears, nose, mouth, throat, and face: Denies mucositis or sore throat Respiratory: Denies cough, dyspnea or wheezes Cardiovascular: Denies palpitation, chest discomfort or lower extremity swelling Gastrointestinal:  Denies nausea, heartburn or change in bowel habits Skin: Denies abnormal skin rashes Lymphatics: Denies new lymphadenopathy or easy bruising Neurological:Denies numbness, tingling or new weaknesses Behavioral/Psych: Mood is stable, no new changes  All other systems were reviewed with the patient and are negative.  I have reviewed the past medical history, past surgical history, social history and family history with the patient and they are unchanged from previous  note.  ALLERGIES:  is allergic to diclofenac.  MEDICATIONS:  Current Outpatient Prescriptions  Medication Sig Dispense Refill  . ALPRAZolam (XANAX) 0.25 MG tablet Take 1 tablet (0.25 mg total) by mouth 3 (three) times daily as needed for sleep or anxiety. (Patient not taking: Reported on 11/15/2014) 30 tablet 0  . ALPRAZolam (XANAX) 0.5 MG tablet     . budesonide-formoterol (SYMBICORT) 160-4.5 MCG/ACT inhaler Inhale 2 puffs into the lungs 2 (two) times daily.    Marland Kitchen gabapentin (NEURONTIN) 100 MG capsule Take 100 mg by mouth 2 (two) times daily.    Marland Kitchen levothyroxine (SYNTHROID, LEVOTHROID) 50 MCG tablet Take 50 mcg by mouth daily.      Marland Kitchen lidocaine (LIDODERM) 5 %     . lisinopril (PRINIVIL,ZESTRIL) 5 MG tablet 5 mg daily.    . metFORMIN (GLUCOPHAGE) 1000 MG tablet Take 250 mg by mouth 2 (two) times daily with a meal.     . omeprazole (PRILOSEC) 40 MG capsule Take 1 capsule (40 mg total) by mouth daily. 30 capsule 6  . simvastatin (ZOCOR) 10 MG tablet Take 10 mg by mouth at bedtime.     . triamcinolone (NASACORT) 55 MCG/ACT AERO nasal inhaler      No current facility-administered medications for this visit.    PHYSICAL EXAMINATION: ECOG PERFORMANCE STATUS: 2 - Symptomatic, <50% confined to bed  Filed Vitals:   04/06/15 1019  BP: 131/53  Pulse: 95  Temp: 97.9 F (36.6 C)  Resp: 20   Filed Weights   04/06/15 1019  Weight: 100 lb 4.8 oz (45.496 kg)    GENERAL:alert, no distress and comfortable SKIN: skin color, texture, turgor are normal, no rashes or significant lesions EYES: normal, Conjunctiva are pale and non-injected, sclera clear OROPHARYNX:no exudate, no erythema and lips, buccal mucosa, and tongue normal  NECK: supple, thyroid normal size, non-tender, without nodularity LYMPH:  no palpable lymphadenopathy in the cervical, axillary or inguinal LUNGS: clear to auscultation and percussion with normal breathing effort HEART: regular rate & rhythm and no murmurs and no lower  extremity edema ABDOMEN:abdomen soft,with epigastric tenderness but no rebound or guarding Musculoskeletal:no cyanosis of digits and no clubbing  NEURO: alert & oriented x 3 with findings of Bell's palsy  LABORATORY DATA:  I have reviewed the data as listed    Component Value Date/Time   NA 141 04/06/2015 1000   NA 142 11/28/2012 1149   K 3.7 04/06/2015 1000   K 3.9 11/28/2012 1149   CL 108 11/28/2012 1149   CL 109* 11/24/2012 0816   CO2 28 04/06/2015 1000   CO2 25 11/28/2012 1149   GLUCOSE 80 04/06/2015 1000   GLUCOSE 99 11/28/2012 1149   GLUCOSE 102* 11/24/2012 0816   BUN 8.6 04/06/2015 1000   BUN 11 11/28/2012 1149   CREATININE 0.7 04/06/2015 1000   CREATININE 0.63 11/28/2012 1149   CALCIUM 9.3 04/06/2015 1000   CALCIUM 9.6 11/28/2012 1149   PROT 7.0 04/06/2015 1000   ALBUMIN 3.8 04/06/2015 1000   AST 16 04/06/2015 1000   ALT 9 04/06/2015 1000   ALKPHOS 96 04/06/2015 1000   BILITOT <0.30 04/06/2015 1000   GFRNONAA 86* 11/28/2012 1149   GFRAA >90 11/28/2012 1149    No results found for: SPEP, UPEP  Lab Results  Component Value Date   WBC 6.7 04/06/2015   NEUTROABS 5.4 04/06/2015   HGB 8.9* 04/06/2015   HCT 29.2* 04/06/2015   MCV 87.2 04/06/2015   PLT 305 04/06/2015      Chemistry      Component Value Date/Time   NA 141 04/06/2015 1000   NA 142 11/28/2012 1149   K 3.7 04/06/2015 1000   K 3.9 11/28/2012 1149   CL 108 11/28/2012 1149   CL 109* 11/24/2012 0816   CO2 28 04/06/2015 1000   CO2 25 11/28/2012 1149   BUN 8.6 04/06/2015 1000   BUN 11 11/28/2012 1149   CREATININE 0.7 04/06/2015 1000   CREATININE 0.63 11/28/2012 1149      Component Value Date/Time   CALCIUM 9.3 04/06/2015 1000   CALCIUM 9.6 11/28/2012 1149   ALKPHOS 96 04/06/2015 1000   AST 16 04/06/2015 1000   ALT 9 04/06/2015 1000   BILITOT <0.30 04/06/2015 1000      ASSESSMENT & PLAN:  Iron deficiency anemia due to chronic blood loss The patient is at high risk of bleeding due to  duodenal angiodysplasia and is on aspirin therapy to prevent recurrence of stroke. The most likely cause of her anemia is due to chronic blood loss/malabsorption syndrome. We discussed some of the risks, benefits, and alternatives of intravenous iron infusions. The patient is symptomatic from anemia and the iron level is critically low. She tolerated oral iron supplement poorly and desires to achieved higher levels of iron faster for adequate hematopoesis. Some of the side-effects to be expected including risks of infusion reactions, phlebitis, headaches, nausea and fatigue.  The patient is willing to proceed. Patient education material was dispensed.  Goal is to keep ferritin level greater than 50 She would need urgent GI consult. I recommend she discontinue all NSAID. She will continue multivitamin and vitamin B 12 supplement to assist in ineffective erythropoiesis  Hypotension due to blood loss I suspect she have hypotension due to slow GI bleed. I recommend discontinuation of lisinopril. She has  lost her primary care doctor recently. I recommended another physician to see her for chronic blood pressure management judgment I will refer her back to GI for evaluation and will reassess her blood count in a few weeks   Orders Placed This Encounter  Procedures  . Hold Tube, Blood Bank    Standing Status: Future     Number of Occurrences:      Standing Expiration Date: 05/10/2016   All questions were answered. The patient knows to call the clinic with any problems, questions or concerns. No barriers to learning was detected. I spent 25 minutes counseling the patient face to face. The total time spent in the appointment was 30 minutes and more than 50% was on counseling and review of test results     [facility], [place], MD 04/06/2015 5:45 PM

## 2015-04-06 NOTE — Telephone Encounter (Signed)
Gave patient avs report and appointments for October and November. Inf scheduler to add tx for today.

## 2015-04-10 ENCOUNTER — Telehealth: Payer: Self-pay | Admitting: Internal Medicine

## 2015-04-10 ENCOUNTER — Telehealth: Payer: Self-pay | Admitting: *Deleted

## 2015-04-10 NOTE — Telephone Encounter (Signed)
Pt called back to report she made appt w/ Dr. Hilarie Fredrickson for 12/6.  Per Dr. Alvy Bimler pt needs to be seen sooner by GI for bleeding with history of angiodysplasia.    I called [facility] to request sooner appt and they will ask nurse to call back.

## 2015-04-10 NOTE — Telephone Encounter (Signed)
[facility] called back w/ appt for 10/20 at 2:30 pm with Laura [name]..   Dr. Alvy Bimler recommends pt take that appt.  I called pt and informed her of appt on 10/20 at 2:30 pm w/ P.A. And she agreed to keep this appt.. She will call office herself if she can't make it for some reason.

## 2015-04-10 NOTE — Telephone Encounter (Signed)
-----   Message from Heath Lark, MD sent at 04/10/2015  8:14 AM EDT ----- Regarding: GI referral Her GI doctor is Dr. Hilarie Fredrickson. Is she able to get in to see him? If not, can you call his office? I sent him an inbasket msg but he never replied

## 2015-04-10 NOTE — Telephone Encounter (Signed)
Pt scheduled to see Alonza Bogus PA 04/19/15@2 :30pm. Message left for Cameo RN to notify pt of appt date and time.

## 2015-04-10 NOTE — Telephone Encounter (Signed)
Pt states she has not heard anything from Dr. Vena Rua office.  She will call now to see if she can get appt..  Asked her to call this nurse back to let us know if they were able to get her scheduled.  She agreed.

## 2015-04-16 ENCOUNTER — Telehealth: Payer: Self-pay | Admitting: Hematology and Oncology

## 2015-04-16 ENCOUNTER — Ambulatory Visit (HOSPITAL_BASED_OUTPATIENT_CLINIC_OR_DEPARTMENT_OTHER): Payer: Medicare Other | Admitting: Hematology and Oncology

## 2015-04-16 ENCOUNTER — Ambulatory Visit (HOSPITAL_BASED_OUTPATIENT_CLINIC_OR_DEPARTMENT_OTHER): Payer: Medicare Other

## 2015-04-16 ENCOUNTER — Other Ambulatory Visit (HOSPITAL_BASED_OUTPATIENT_CLINIC_OR_DEPARTMENT_OTHER): Payer: Medicare Other

## 2015-04-16 ENCOUNTER — Encounter: Payer: Self-pay | Admitting: Hematology and Oncology

## 2015-04-16 VITALS — BP 149/61 | HR 94 | Temp 98.2°F | Resp 18 | Ht 61.0 in | Wt 96.7 lb

## 2015-04-16 DIAGNOSIS — D500 Iron deficiency anemia secondary to blood loss (chronic): Secondary | ICD-10-CM | POA: Diagnosis not present

## 2015-04-16 DIAGNOSIS — K31811 Angiodysplasia of stomach and duodenum with bleeding: Secondary | ICD-10-CM

## 2015-04-16 DIAGNOSIS — R58 Hemorrhage, not elsewhere classified: Secondary | ICD-10-CM

## 2015-04-16 DIAGNOSIS — G8194 Hemiplegia, unspecified affecting left nondominant side: Secondary | ICD-10-CM

## 2015-04-16 DIAGNOSIS — I9589 Other hypotension: Secondary | ICD-10-CM

## 2015-04-16 LAB — FERRITIN CHCC: Ferritin: 225 ng/ml (ref 9–269)

## 2015-04-16 LAB — CBC & DIFF AND RETIC
BASO%: 0.4 % (ref 0.0–2.0)
BASOS ABS: 0 10*3/uL (ref 0.0–0.1)
EOS ABS: 0.1 10*3/uL (ref 0.0–0.5)
EOS%: 1.1 % (ref 0.0–7.0)
HEMATOCRIT: 32.8 % — AB (ref 34.8–46.6)
HEMOGLOBIN: 10 g/dL — AB (ref 11.6–15.9)
Immature Retic Fract: 11.1 % — ABNORMAL HIGH (ref 1.60–10.00)
LYMPH#: 0.7 10*3/uL — AB (ref 0.9–3.3)
LYMPH%: 13.1 % — ABNORMAL LOW (ref 14.0–49.7)
MCH: 28.7 pg (ref 25.1–34.0)
MCHC: 30.5 g/dL — ABNORMAL LOW (ref 31.5–36.0)
MCV: 94 fL (ref 79.5–101.0)
MONO#: 0.4 10*3/uL (ref 0.1–0.9)
MONO%: 6.2 % (ref 0.0–14.0)
NEUT#: 4.5 10*3/uL (ref 1.5–6.5)
NEUT%: 79.2 % — ABNORMAL HIGH (ref 38.4–76.8)
Platelets: 239 10*3/uL (ref 145–400)
RBC: 3.49 10*6/uL — ABNORMAL LOW (ref 3.70–5.45)
RDW: 23.3 % — AB (ref 11.2–14.5)
RETIC CT ABS: 145.18 10*3/uL — AB (ref 33.70–90.70)
Retic %: 4.16 % — ABNORMAL HIGH (ref 0.70–2.10)
WBC: 5.6 10*3/uL (ref 3.9–10.3)

## 2015-04-16 LAB — HOLD TUBE, BLOOD BANK

## 2015-04-16 MED ORDER — SODIUM CHLORIDE 0.9 % IV SOLN
Freq: Once | INTRAVENOUS | Status: AC
  Administered 2015-04-16: 13:00:00 via INTRAVENOUS

## 2015-04-16 MED ORDER — SODIUM CHLORIDE 0.9 % IV SOLN
510.0000 mg | Freq: Once | INTRAVENOUS | Status: AC
  Administered 2015-04-16: 510 mg via INTRAVENOUS
  Filled 2015-04-16: qty 17

## 2015-04-16 NOTE — Telephone Encounter (Signed)
Gave and printd papt sched and avs for pt for NOV

## 2015-04-16 NOTE — Assessment & Plan Note (Signed)
The patient is at high risk of bleeding due to duodenal angiodysplasia and is on aspirin therapy to prevent recurrence of stroke. The most likely cause of her anemia is due to chronic blood loss/malabsorption syndrome. We discussed some of the risks, benefits, and alternatives of intravenous iron infusions. The patient is symptomatic from anemia and the iron level is critically low. She tolerated oral iron supplement poorly and desires to achieved higher levels of iron faster for adequate hematopoesis. Some of the side-effects to be expected including risks of infusion reactions, phlebitis, headaches, nausea and fatigue.  The patient is willing to proceed. Patient education material was dispensed.  She responded well to IV iron last week. We will proceed with second dose today. She needs GI workup and she is scheduled to see them later this week. I will see her back next month for further evaluation with repeat blood work

## 2015-04-16 NOTE — Assessment & Plan Note (Signed)
I suspect she have hypotension due to slow GI bleed. I recommend discontinuation of lisinopril. Her blood pressure has improved Continue close monitoring. I would not restart her on any new blood pressure medication until I see her back next week.

## 2015-04-16 NOTE — Progress Notes (Signed)
[place], MD SUMMARY OF HEMATOLOGIC HISTORY:  This is a pleasant lady with background history of chronic GI bleed. She had remote history of colon cancer diagnosed in 1999 status post resection. She also had remote history of breast cancer diagnosed in 2000, neither records were available. She had EGD and colonoscopy in 2014 which showed angioectasias and multiple benign polyps. In May of 2014, November 2014, March 2015 and November 2015, she received intravenous iron infusion with improvement of her hemoglobin. INTERVAL HISTORY: Laura [name] 80 y.o. female returns for further follow-up. She feels weak and fatigued. She has a GI appointment schedule. The patient denies any recent signs or symptoms of bleeding such as spontaneous epistaxis, hematuria or hematochezia. She tolerated recent IV iron well.  I have reviewed the past medical history, past surgical history, social history and family history with the patient and they are unchanged from previous note.  ALLERGIES:  is allergic to diclofenac.  MEDICATIONS:  Current Outpatient Prescriptions  Medication Sig Dispense Refill  . ALPRAZolam (XANAX) 0.25 MG tablet Take 1 tablet (0.25 mg total) by mouth 3 (three) times daily as needed for sleep or anxiety. 30 tablet 0  . ALPRAZolam (XANAX) 0.5 MG tablet     . budesonide-formoterol (SYMBICORT) 160-4.5 MCG/ACT inhaler Inhale 2 puffs into the lungs 2 (two) times daily.    Marland Kitchen gabapentin (NEURONTIN) 100 MG capsule Take 100 mg by mouth 2 (two) times daily.    Marland Kitchen levothyroxine (SYNTHROID, LEVOTHROID) 50 MCG tablet Take 50 mcg by mouth daily.      Marland Kitchen lidocaine (LIDODERM) 5 %     . lisinopril (PRINIVIL,ZESTRIL) 5 MG tablet 5 mg daily.    . metFORMIN (GLUCOPHAGE) 1000 MG tablet Take 250 mg by mouth 2 (two) times daily with a meal.     . omeprazole (PRILOSEC) 40 MG capsule Take 1 capsule (40 mg total) by mouth daily. 30 capsule 6  . simvastatin  (ZOCOR) 10 MG tablet Take 10 mg by mouth at bedtime.     . triamcinolone (NASACORT) 55 MCG/ACT AERO nasal inhaler      No current facility-administered medications for this visit.   Facility-Administered Medications Ordered in Other Visits  Medication Dose Route Frequency Provider Last Rate Last Dose  . ferumoxytol (FERAHEME) 510 mg in sodium chloride 0.9 % 100 mL IVPB  510 mg Intravenous Once Heath Lark, MD   510 mg at 04/16/15 1332     REVIEW OF SYSTEMS:   Constitutional: Denies fevers, chills or night sweats Eyes: Denies blurriness of vision Ears, nose, mouth, throat, and face: Denies mucositis or sore throat Respiratory: Denies cough, dyspnea or wheezes Cardiovascular: Denies palpitation, chest discomfort or lower extremity swelling Gastrointestinal:  Denies nausea, heartburn or change in bowel habits Skin: Denies abnormal skin rashes Lymphatics: Denies new lymphadenopathy or easy bruising Neurological:Denies numbness, tingling or new weaknesses Behavioral/Psych: Mood is stable, no new changes  All other systems were reviewed with the patient and are negative.  PHYSICAL EXAMINATION: ECOG PERFORMANCE STATUS: 1 - Symptomatic but completely ambulatory  Filed Vitals:   04/16/15 1135  BP: 149/61  Pulse: 94  Temp: 98.2 F (36.8 C)  Resp: 18   Filed Weights   04/16/15 1135  Weight: 96 lb 11.2 oz (43.863 kg)    GENERAL:alert, no distress and comfortable. She looks thin SKIN: skin color, texture, turgor are normal, no rashes or significant lesions EYES: normal, Conjunctiva are pink and non-injected, sclera clear Musculoskeletal:no cyanosis  of digits and no clubbing  NEURO: alert & oriented x 3 with fluent speech, no focal motor/sensory deficits  LABORATORY DATA:  I have reviewed the data as listed Results for orders placed or performed in visit on 04/16/15 (from the past 48 hour(s))  CBC & Diff and Retic     Status: Abnormal   Collection Time: 04/16/15 11:09 AM  Result  Value Ref Range   WBC 5.6 3.9 - 10.3 10e3/uL   NEUT# 4.5 1.5 - 6.5 10e3/uL   HGB 10.0 (L) 11.6 - 15.9 g/dL   HCT 32.8 (L) 34.8 - 46.6 %   Platelets 239 145 - 400 10e3/uL   MCV 94.0 79.5 - 101.0 fL   MCH 28.7 25.1 - 34.0 pg   MCHC 30.5 (L) 31.5 - 36.0 g/dL   RBC 3.49 (L) 3.70 - 5.45 10e6/uL   RDW 23.3 (H) 11.2 - 14.5 %   lymph# 0.7 (L) 0.9 - 3.3 10e3/uL   MONO# 0.4 0.1 - 0.9 10e3/uL   Eosinophils Absolute 0.1 0.0 - 0.5 10e3/uL   Basophils Absolute 0.0 0.0 - 0.1 10e3/uL   NEUT% 79.2 (H) 38.4 - 76.8 %   LYMPH% 13.1 (L) 14.0 - 49.7 %   MONO% 6.2 0.0 - 14.0 %   EOS% 1.1 0.0 - 7.0 %   BASO% 0.4 0.0 - 2.0 %   Retic % 4.16 (H) 0.70 - 2.10 %   Retic Ct Abs 145.18 (H) 33.70 - 90.70 10e3/uL   Immature Retic Fract 11.10 (H) 1.60 - 10.00 %  Ferritin     Status: None   Collection Time: 04/16/15 11:09 AM  Result Value Ref Range   Ferritin 225 9 - 269 ng/ml  Hold Tube, Blood Bank     Status: None   Collection Time: 04/16/15 11:09 AM  Result Value Ref Range   Hold Tube, Blood Bank Blood Bank Order Cancelled     Lab Results  Component Value Date   WBC 5.6 04/16/2015   HGB 10.0* 04/16/2015   HCT 32.8* 04/16/2015   MCV 94.0 04/16/2015   PLT 239 04/16/2015   \ASSESSMENT & PLAN:  Iron deficiency anemia due to chronic blood loss The patient is at high risk of bleeding due to duodenal angiodysplasia and is on aspirin therapy to prevent recurrence of stroke. The most likely cause of her anemia is due to chronic blood loss/malabsorption syndrome. We discussed some of the risks, benefits, and alternatives of intravenous iron infusions. The patient is symptomatic from anemia and the iron level is critically low. She tolerated oral iron supplement poorly and desires to achieved higher levels of iron faster for adequate hematopoesis. Some of the side-effects to be expected including risks of infusion reactions, phlebitis, headaches, nausea and fatigue.  The patient is willing to proceed. Patient  education material was dispensed.  She responded well to IV iron last week. We will proceed with second dose today. She needs GI workup and she is scheduled to see them later this week. I will see her back next month for further evaluation with repeat blood work  Hypotension due to blood loss I suspect she have hypotension due to slow GI bleed. I recommend discontinuation of lisinopril. Her blood pressure has improved Continue close monitoring. I would not restart her on any new blood pressure medication until I see her back next week.   All questions were answered. The patient knows to call the clinic with any problems, questions or concerns. No barriers to learning was detected.  I spent 15 minutes counseling the patient face to face. The total time spent in the appointment was 20 minutes and more than 50% was on counseling.     Evyn Kooyman, MD 10/17/20161:40 PM

## 2015-04-16 NOTE — Patient Instructions (Signed)

## 2015-04-19 ENCOUNTER — Other Ambulatory Visit: Payer: Self-pay

## 2015-04-19 ENCOUNTER — Encounter: Payer: Self-pay | Admitting: Gastroenterology

## 2015-04-19 ENCOUNTER — Ambulatory Visit (INDEPENDENT_AMBULATORY_CARE_PROVIDER_SITE_OTHER): Payer: Medicare Other | Admitting: Gastroenterology

## 2015-04-19 VITALS — BP 126/68 | HR 96 | Ht 61.0 in | Wt 95.4 lb

## 2015-04-19 DIAGNOSIS — D509 Iron deficiency anemia, unspecified: Secondary | ICD-10-CM | POA: Diagnosis not present

## 2015-04-19 DIAGNOSIS — R195 Other fecal abnormalities: Secondary | ICD-10-CM | POA: Diagnosis not present

## 2015-04-19 DIAGNOSIS — Z8774 Personal history of (corrected) congenital malformations of heart and circulatory system: Secondary | ICD-10-CM | POA: Insufficient documentation

## 2015-04-19 DIAGNOSIS — Z8679 Personal history of other diseases of the circulatory system: Secondary | ICD-10-CM | POA: Diagnosis not present

## 2015-04-19 DIAGNOSIS — K5520 Angiodysplasia of colon without hemorrhage: Secondary | ICD-10-CM

## 2015-04-19 DIAGNOSIS — D500 Iron deficiency anemia secondary to blood loss (chronic): Secondary | ICD-10-CM

## 2015-04-19 NOTE — Patient Instructions (Signed)
You have been given instructions for a capsule endoscopy. We have scheduled the capsule endo for 05-07-2015 @ 8:00 am .   Come to our 3rd floor. [facility].

## 2015-04-19 NOTE — Progress Notes (Addendum)
     04/19/2015 Laura [name] [id number] February 20, 1935   History of Present Illness:  This is a pleasant 80 year old female who is known to Dr. Hilarie Fredrickson. She has history of colon cancer in 1999. Her last colonoscopy was in April 2014 at which time she was found to have several polyps that were removed, some tubular adenomas, some hyperplastic, and some benign polypoid mucosa. Repeat colonoscopy is recommended in 3 years from that time. She also underwent EGD in April 2014 as well at which time she was found to have AVMs in the second portion of the duodenum. She was then rescheduled for a small bowel enteroscopy with APC in May 2014 and had some of those lesions cauterized actually in the gastric cardia, and second/third portions of the duodenum. Also applied one Endo Clip as well. She is now being referred back to our office on this occasion by the hematologist/oncologist for recurrent iron deficiency anemia. She is receiving IV iron infusions. She had 2 so far and is scheduled for her third one in November. She denies any obvious gross rectal bleeding. She says her stools are dark in color, but not black and tarry. Most recent hemoglobin 3 days ago was 10.0 gram. Two weeks prior to that it was 8.9 grams. Going back 5 months ago her hemoglobin had normalized at 12.1 grams. She also reports a 3-6 pound weight loss over the past month or two.  According to our records she is down 13 pounds since June 2014.  She says that she just does not have much appetite and forces herself to eat.  Overall she does not feel sick, just fatigued.   Current Medications, Allergies, Past Medical History, Past Surgical History, Family History and Social History were reviewed in [employer] record.   Physical Exam: BP 126/68 mmHg  Pulse 96  Ht 5\' 1"  (1.549 m)  Wt 95 lb 6 oz (43.262 kg)  BMI 18.03 kg/m2 General: Well developed black female in no acute distress Head: Normocephalic and  atraumatic Eyes:  Sclerae anicteric, conjunctiva pink  Ears: Normal auditory acuity Lungs: Clear throughout to auscultation Heart: Regular rate and rhythm Abdomen: Soft, non-distended.  Normal bowel sounds.  Non-tender. Rectal:  No external hemorrhoids noted.  Soft stool felt in anal vault.  Was dark brown and heme positive. Musculoskeletal: Symmetrical with no gross deformities  Extremities: No edema  Neurological: Alert oriented x 4, grossly non-focal Psychological:  Alert and cooperative. Normal mood and affect  Assessment and Recommendations: -80 year old female with an iron deficiency anemia and heme positive stools. Has history of bleeding gastric and small bowel/duodenal AVMs requiring APC. Also has history of colon cancer in 1999 with several polyps on last colonoscopy. Again being referred by oncology for the same issue. I discussed with Dr. Silverio Decamp. We have decided to have her undergo a wireless capsule endoscopy to fully assess the small bowel determine where the AVMs are present before scheduling endoscopic procedures. Continue iron infusions per oncology.  She will most definitely need repeat endoscopies, however, will await results of wireless capsule study.  Addendum: Reviewed and agree with current management. Jerene Bears, MD

## 2015-05-03 ENCOUNTER — Telehealth: Payer: Self-pay

## 2015-05-03 DIAGNOSIS — E1121 Type 2 diabetes mellitus with diabetic nephropathy: Secondary | ICD-10-CM

## 2015-05-03 DIAGNOSIS — E785 Hyperlipidemia, unspecified: Secondary | ICD-10-CM

## 2015-05-03 NOTE — Telephone Encounter (Signed)
Returning pt call: her PCP closed his office. Her new PCP cannot see her until December 6. Can Dr Alvy Bimler call in 2 refills to hold her until her new PCP sees her? Metformin 500 mg 1 tablet twice a day, simvastatin 10 mg 1 tablet at bedtime.

## 2015-05-04 ENCOUNTER — Encounter: Payer: Self-pay | Admitting: *Deleted

## 2015-05-04 MED ORDER — SIMVASTATIN 10 MG PO TABS: 10.0000 mg | ORAL_TABLET | Freq: Every day | ORAL | Status: DC

## 2015-05-04 MED ORDER — METFORMIN HCL 500 MG PO TABS
500.0000 mg | ORAL_TABLET | Freq: Two times a day (BID) | ORAL | Status: DC
Start: 2015-05-04 — End: 2016-03-04

## 2015-05-04 NOTE — Telephone Encounter (Signed)
OK to refill

## 2015-05-04 NOTE — Telephone Encounter (Signed)
Pt called to let her know Dr Alvy Bimler is refilling her 2 medications.

## 2015-05-04 NOTE — Addendum Note (Signed)
Addended by: Janace Hoard on: 05/04/2015 12:21 PM   Modules accepted: Orders

## 2015-05-07 ENCOUNTER — Ambulatory Visit (INDEPENDENT_AMBULATORY_CARE_PROVIDER_SITE_OTHER): Payer: Medicare Other | Admitting: Internal Medicine

## 2015-05-07 DIAGNOSIS — D509 Iron deficiency anemia, unspecified: Secondary | ICD-10-CM | POA: Diagnosis not present

## 2015-05-07 DIAGNOSIS — K31811 Angiodysplasia of stomach and duodenum with bleeding: Secondary | ICD-10-CM | POA: Diagnosis not present

## 2015-05-07 NOTE — Progress Notes (Signed)
Patient here for capsule endoscopy. Tolerated procedure. Verbalizes understanding of written and verbal instructions. Lot # [id number] s exp 05/2016 capsule ID [id number]

## 2015-05-17 ENCOUNTER — Other Ambulatory Visit: Payer: Self-pay

## 2015-05-17 ENCOUNTER — Telehealth: Payer: Self-pay

## 2015-05-17 DIAGNOSIS — D649 Anemia, unspecified: Secondary | ICD-10-CM

## 2015-05-17 NOTE — Telephone Encounter (Signed)
Pt scheduled for SBE with MAC at [facility] 05/22/15 @9 :30am. Pt to arrive there at 8am. Pt to be NPO after midnight. Spoke with pts daughter and they are aware of appt.

## 2015-05-17 NOTE — Telephone Encounter (Signed)
Capsule endo results show AVM's that may have been oozing. Per Dr. Hilarie Fredrickson pt needs to be  scheduled for SBE with MAC at [facility] 05/22/15. Left message for pt to call back.

## 2015-05-18 ENCOUNTER — Other Ambulatory Visit (HOSPITAL_BASED_OUTPATIENT_CLINIC_OR_DEPARTMENT_OTHER): Payer: Medicare Other

## 2015-05-18 ENCOUNTER — Encounter: Payer: Self-pay | Admitting: Hematology and Oncology

## 2015-05-18 ENCOUNTER — Encounter (HOSPITAL_COMMUNITY): Payer: Self-pay | Admitting: *Deleted

## 2015-05-18 ENCOUNTER — Ambulatory Visit (HOSPITAL_BASED_OUTPATIENT_CLINIC_OR_DEPARTMENT_OTHER): Payer: Medicare Other | Admitting: Hematology and Oncology

## 2015-05-18 ENCOUNTER — Ambulatory Visit: Payer: Medicare Other

## 2015-05-18 ENCOUNTER — Telehealth: Payer: Self-pay | Admitting: Hematology and Oncology

## 2015-05-18 VITALS — BP 156/59 | HR 79 | Temp 97.5°F | Resp 18 | Ht 61.0 in | Wt 94.2 lb

## 2015-05-18 DIAGNOSIS — Z85038 Personal history of other malignant neoplasm of large intestine: Secondary | ICD-10-CM

## 2015-05-18 DIAGNOSIS — I10 Essential (primary) hypertension: Secondary | ICD-10-CM | POA: Diagnosis not present

## 2015-05-18 DIAGNOSIS — D500 Iron deficiency anemia secondary to blood loss (chronic): Secondary | ICD-10-CM | POA: Diagnosis not present

## 2015-05-18 DIAGNOSIS — G8194 Hemiplegia, unspecified affecting left nondominant side: Secondary | ICD-10-CM

## 2015-05-18 LAB — CBC & DIFF AND RETIC
BASO%: 0.4 % (ref 0.0–2.0)
Basophils Absolute: 0 10*3/uL (ref 0.0–0.1)
EOS%: 1.2 % (ref 0.0–7.0)
Eosinophils Absolute: 0.1 10*3/uL (ref 0.0–0.5)
HCT: 37.9 % (ref 34.8–46.6)
HGB: 12 g/dL (ref 11.6–15.9)
Immature Retic Fract: 2.3 % (ref 1.60–10.00)
LYMPH#: 0.6 10*3/uL — AB (ref 0.9–3.3)
LYMPH%: 12.4 % — ABNORMAL LOW (ref 14.0–49.7)
MCH: 31.6 pg (ref 25.1–34.0)
MCHC: 31.7 g/dL (ref 31.5–36.0)
MCV: 99.7 fL (ref 79.5–101.0)
MONO#: 0.3 10*3/uL (ref 0.1–0.9)
MONO%: 5.9 % (ref 0.0–14.0)
NEUT#: 3.9 10*3/uL (ref 1.5–6.5)
NEUT%: 80.1 % — ABNORMAL HIGH (ref 38.4–76.8)
PLATELETS: 208 10*3/uL (ref 145–400)
RBC: 3.8 10*6/uL (ref 3.70–5.45)
RDW: 20.1 % — AB (ref 11.2–14.5)
RETIC %: 0.79 % (ref 0.70–2.10)
RETIC CT ABS: 30.02 10*3/uL — AB (ref 33.70–90.70)
WBC: 4.9 10*3/uL (ref 3.9–10.3)

## 2015-05-18 LAB — FERRITIN CHCC: Ferritin: 75 ng/ml (ref 9–269)

## 2015-05-18 NOTE — Assessment & Plan Note (Signed)
Her last colonoscopy was in April 2014. Continue close observation. Defer to GI for timing of next colonoscopy

## 2015-05-18 NOTE — Assessment & Plan Note (Signed)
Her anemia has resolved. I will monitor her blood count closely every 2 months with blood draw. She is undergoing GI evaluation with capsule endoscopy now.

## 2015-05-18 NOTE — Assessment & Plan Note (Signed)
I stop her blood pressure medications recently due to hypotension related to blood loss. Since recovery of her anemia, her blood pressure is now trending high. Recommend she resume lisinopril and follow-up with PCP for blood pressure management

## 2015-05-18 NOTE — Progress Notes (Signed)
[place] OFFICE PROGRESS NOTE  No PCP Per Patient SUMMARY OF HEMATOLOGIC HISTORY:  This is a pleasant lady with background history of chronic GI bleed. She had remote history of colon cancer diagnosed in 1999 status post resection. She also had remote history of breast cancer diagnosed in 2000, neither records were available. She had EGD and colonoscopy in 2014 which showed angioectasias and multiple benign polyps. In May of 2014, November 2014, March 2015, November 2015, and October 2016 she received intravenous iron infusion with improvement of her hemoglobin. INTERVAL HISTORY: Laura [name] 80 y.o. female returns for further follow-up. Her energy level is better. She is undergoing GI evaluation. The patient denies any recent signs or symptoms of bleeding such as spontaneous epistaxis, hematuria or hematochezia.   I have reviewed the past medical history, past surgical history, social history and family history with the patient and they are unchanged from previous note.  ALLERGIES:  is allergic to diclofenac.  MEDICATIONS:  Current Outpatient Prescriptions  Medication Sig Dispense Refill  . ALPRAZolam (XANAX) 0.25 MG tablet Take 1 tablet (0.25 mg total) by mouth 3 (three) times daily as needed for sleep or anxiety. 30 tablet 0  . budesonide-formoterol (SYMBICORT) 160-4.5 MCG/ACT inhaler Inhale 2 puffs into the lungs 2 (two) times daily.    Marland Kitchen gabapentin (NEURONTIN) 100 MG capsule Take 100 mg by mouth 2 (two) times daily.    Marland Kitchen levothyroxine (SYNTHROID, LEVOTHROID) 50 MCG tablet Take 50 mcg by mouth daily.      Marland Kitchen lidocaine (LIDODERM) 5 %     . lisinopril (PRINIVIL,ZESTRIL) 5 MG tablet 5 mg daily.    . metFORMIN (GLUCOPHAGE) 500 MG tablet Take 1 tablet (500 mg total) by mouth 2 (two) times daily with a meal. 70 tablet 0  . omeprazole (PRILOSEC) 40 MG capsule Take 1 capsule (40 mg total) by mouth daily. 30 capsule 6  . simvastatin (ZOCOR) 10 MG tablet Take 1 tablet (10 mg  total) by mouth at bedtime. 40 tablet 0  . triamcinolone (NASACORT) 55 MCG/ACT AERO nasal inhaler      No current facility-administered medications for this visit.     REVIEW OF SYSTEMS:   Constitutional: Denies fevers, chills or night sweats Eyes: Denies blurriness of vision Ears, nose, mouth, throat, and face: Denies mucositis or sore throat Respiratory: Denies cough, dyspnea or wheezes Cardiovascular: Denies palpitation, chest discomfort or lower extremity swelling Gastrointestinal:  Denies nausea, heartburn or change in bowel habits Skin: Denies abnormal skin rashes Lymphatics: Denies new lymphadenopathy or easy bruising Neurological:Denies numbness, tingling or new weaknesses Behavioral/Psych: Mood is stable, no new changes  All other systems were reviewed with the patient and are negative.  PHYSICAL EXAMINATION: ECOG PERFORMANCE STATUS: 0 - Asymptomatic  Filed Vitals:   05/18/15 1000  BP: 156/59  Pulse: 79  Temp: 97.5 F (36.4 C)  Resp: 18   Filed Weights   05/18/15 1000  Weight: 94 lb 3.2 oz (42.729 kg)    GENERAL:alert, no distress and comfortable SKIN: skin color, texture, turgor are normal, no rashes or significant lesions EYES: normal, Conjunctiva are pink and non-injected, sclera clear Musculoskeletal:no cyanosis of digits and no clubbing  NEURO: alert & oriented x 3 with fluent speech, she has left facial droopiness unchanged compared to prior visit  LABORATORY DATA:  I have reviewed the data as listed Results for orders placed or performed in visit on 05/18/15 (from the past 48 hour(s))  CBC & Diff and Retic  Status: Abnormal   Collection Time: 05/18/15  9:35 AM  Result Value Ref Range   WBC 4.9 3.9 - 10.3 10e3/uL   NEUT# 3.9 1.5 - 6.5 10e3/uL   HGB 12.0 11.6 - 15.9 g/dL   HCT 37.9 34.8 - 46.6 %   Platelets 208 145 - 400 10e3/uL   MCV 99.7 79.5 - 101.0 fL   MCH 31.6 25.1 - 34.0 pg   MCHC 31.7 31.5 - 36.0 g/dL   RBC 3.80 3.70 - 5.45 10e6/uL    RDW 20.1 (H) 11.2 - 14.5 %   lymph# 0.6 (L) 0.9 - 3.3 10e3/uL   MONO# 0.3 0.1 - 0.9 10e3/uL   Eosinophils Absolute 0.1 0.0 - 0.5 10e3/uL   Basophils Absolute 0.0 0.0 - 0.1 10e3/uL   NEUT% 80.1 (H) 38.4 - 76.8 %   LYMPH% 12.4 (L) 14.0 - 49.7 %   MONO% 5.9 0.0 - 14.0 %   EOS% 1.2 0.0 - 7.0 %   BASO% 0.4 0.0 - 2.0 %   Retic % 0.79 0.70 - 2.10 %   Retic Ct Abs 30.02 (L) 33.70 - 90.70 10e3/uL   Immature Retic Fract 2.30 1.60 - 10.00 %    Lab Results  Component Value Date   WBC 4.9 05/18/2015   HGB 12.0 05/18/2015   HCT 37.9 05/18/2015   MCV 99.7 05/18/2015   PLT 208 05/18/2015   ASSESSMENT & PLAN:  History of colon cancer Her last colonoscopy was in April 2014. Continue close observation. Defer to GI for timing of next colonoscopy  Iron deficiency anemia due to chronic blood loss Her anemia has resolved. I will monitor her blood count closely every 2 months with blood draw. She is undergoing GI evaluation with capsule endoscopy now.  Essential hypertension I stop her blood pressure medications recently due to hypotension related to blood loss. Since recovery of her anemia, her blood pressure is now trending high. Recommend she resume lisinopril and follow-up with PCP for blood pressure management   All questions were answered. The patient knows to call the clinic with any problems, questions or concerns. No barriers to learning was detected.  I spent 15 minutes counseling the patient face to face. The total time spent in the appointment was 20 minutes and more than 50% was on counseling.     Jomes Giraldo, MD 11/18/201610:19 AM

## 2015-05-18 NOTE — Telephone Encounter (Signed)
per of to sch pt appt-gave pt copy of avs °

## 2015-05-21 ENCOUNTER — Encounter: Payer: Self-pay | Admitting: Internal Medicine

## 2015-05-22 ENCOUNTER — Ambulatory Visit (HOSPITAL_COMMUNITY): Payer: Medicare Other | Admitting: Certified Registered Nurse Anesthetist

## 2015-05-22 ENCOUNTER — Encounter (HOSPITAL_COMMUNITY)
Admission: RE | Disposition: A | Discharge status: Home / Self Care | Payer: Self-pay | Source: Ambulatory Visit | Attending: Internal Medicine

## 2015-05-22 ENCOUNTER — Encounter (HOSPITAL_COMMUNITY): Payer: Self-pay | Admitting: *Deleted

## 2015-05-22 ENCOUNTER — Ambulatory Visit (HOSPITAL_COMMUNITY)
Admission: RE | Admit: 2015-05-22 | Discharge: 2015-05-22 | Disposition: A | Discharge status: Home / Self Care | Payer: Medicare Other | Source: Ambulatory Visit | Attending: Internal Medicine | Admitting: Internal Medicine

## 2015-05-22 DIAGNOSIS — K219 Gastro-esophageal reflux disease without esophagitis: Secondary | ICD-10-CM | POA: Insufficient documentation

## 2015-05-22 DIAGNOSIS — K6389 Other specified diseases of intestine: Secondary | ICD-10-CM | POA: Diagnosis not present

## 2015-05-22 DIAGNOSIS — R29810 Facial weakness: Secondary | ICD-10-CM | POA: Diagnosis not present

## 2015-05-22 DIAGNOSIS — E7800 Pure hypercholesterolemia, unspecified: Secondary | ICD-10-CM | POA: Insufficient documentation

## 2015-05-22 DIAGNOSIS — Z79899 Other long term (current) drug therapy: Secondary | ICD-10-CM | POA: Insufficient documentation

## 2015-05-22 DIAGNOSIS — K5521 Angiodysplasia of colon with hemorrhage: Secondary | ICD-10-CM | POA: Diagnosis not present

## 2015-05-22 DIAGNOSIS — H9193 Unspecified hearing loss, bilateral: Secondary | ICD-10-CM | POA: Insufficient documentation

## 2015-05-22 DIAGNOSIS — F17210 Nicotine dependence, cigarettes, uncomplicated: Secondary | ICD-10-CM | POA: Diagnosis not present

## 2015-05-22 DIAGNOSIS — E1140 Type 2 diabetes mellitus with diabetic neuropathy, unspecified: Secondary | ICD-10-CM | POA: Diagnosis not present

## 2015-05-22 DIAGNOSIS — D509 Iron deficiency anemia, unspecified: Secondary | ICD-10-CM | POA: Insufficient documentation

## 2015-05-22 DIAGNOSIS — J449 Chronic obstructive pulmonary disease, unspecified: Secondary | ICD-10-CM | POA: Diagnosis not present

## 2015-05-22 DIAGNOSIS — K2980 Duodenitis without bleeding: Secondary | ICD-10-CM | POA: Diagnosis not present

## 2015-05-22 DIAGNOSIS — E039 Hypothyroidism, unspecified: Secondary | ICD-10-CM | POA: Insufficient documentation

## 2015-05-22 DIAGNOSIS — Z7984 Long term (current) use of oral hypoglycemic drugs: Secondary | ICD-10-CM | POA: Diagnosis not present

## 2015-05-22 DIAGNOSIS — K31811 Angiodysplasia of stomach and duodenum with bleeding: Secondary | ICD-10-CM | POA: Diagnosis not present

## 2015-05-22 DIAGNOSIS — I10 Essential (primary) hypertension: Secondary | ICD-10-CM | POA: Diagnosis not present

## 2015-05-22 DIAGNOSIS — Z853 Personal history of malignant neoplasm of breast: Secondary | ICD-10-CM | POA: Insufficient documentation

## 2015-05-22 DIAGNOSIS — Z85038 Personal history of other malignant neoplasm of large intestine: Secondary | ICD-10-CM | POA: Diagnosis not present

## 2015-05-22 DIAGNOSIS — K5520 Angiodysplasia of colon without hemorrhage: Secondary | ICD-10-CM | POA: Diagnosis not present

## 2015-05-22 DIAGNOSIS — Z9049 Acquired absence of other specified parts of digestive tract: Secondary | ICD-10-CM | POA: Insufficient documentation

## 2015-05-22 DIAGNOSIS — G709 Myoneural disorder, unspecified: Secondary | ICD-10-CM | POA: Insufficient documentation

## 2015-05-22 DIAGNOSIS — D649 Anemia, unspecified: Secondary | ICD-10-CM | POA: Diagnosis not present

## 2015-05-22 HISTORY — PX: ENTEROSCOPY: SHX5533

## 2015-05-22 LAB — GLUCOSE, CAPILLARY: GLUCOSE-CAPILLARY: 80 mg/dL (ref 65–99)

## 2015-05-22 SURGERY — ENTEROSCOPY
Anesthesia: Monitor Anesthesia Care

## 2015-05-22 MED ORDER — LACTATED RINGERS IV SOLN
INTRAVENOUS | Status: DC
  Administered 2015-05-22: 1000 mL via INTRAVENOUS

## 2015-05-22 MED ORDER — FENTANYL CITRATE (PF) 100 MCG/2ML IJ SOLN: 25.0000 ug | INTRAMUSCULAR | Status: DC | PRN

## 2015-05-22 MED ORDER — ONDANSETRON HCL 4 MG/2ML IJ SOLN
INTRAMUSCULAR | Status: DC | PRN
Start: 2015-05-22 — End: 2015-05-22
  Administered 2015-05-22: 4 mg via INTRAVENOUS

## 2015-05-22 MED ORDER — ESMOLOL HCL 100 MG/10ML IV SOLN
INTRAVENOUS | Status: DC | PRN
  Administered 2015-05-22 (×2): 20 mg via INTRAVENOUS

## 2015-05-22 MED ORDER — ONDANSETRON HCL 4 MG/2ML IJ SOLN
INTRAMUSCULAR | Status: AC
  Filled 2015-05-22: qty 2

## 2015-05-22 MED ORDER — PROPOFOL 10 MG/ML IV BOLUS
INTRAVENOUS | Status: AC
  Filled 2015-05-22: qty 40

## 2015-05-22 MED ORDER — LABETALOL HCL 5 MG/ML IV SOLN
INTRAVENOUS | Status: AC
  Filled 2015-05-22: qty 4

## 2015-05-22 MED ORDER — ONDANSETRON HCL 4 MG/2ML IJ SOLN: 4.0000 mg | Freq: Once | INTRAMUSCULAR | Status: DC | PRN

## 2015-05-22 MED ORDER — SODIUM CHLORIDE 0.9 % IV SOLN: INTRAVENOUS | Status: DC

## 2015-05-22 MED ORDER — ESMOLOL HCL 100 MG/10ML IV SOLN
INTRAVENOUS | Status: AC
  Filled 2015-05-22: qty 10

## 2015-05-22 MED ORDER — PROPOFOL 500 MG/50ML IV EMUL
INTRAVENOUS | Status: DC | PRN
  Administered 2015-05-22: 300 ug/kg/min via INTRAVENOUS

## 2015-05-22 NOTE — Op Note (Signed)
[facility] [place], [id number]   ENTEROSCOPY PROCEDURE REPORT     EXAM DATE: 05/22/2015  PATIENT NAME:      Laura [name], Laura [name]           MR #:      [id number]  BIRTHDATE:       08/02/1934      VISIT #:     [id number]  ATTENDING:     Jerene Bears, MD     STATUS:     outpatient ASSISTANT:      Cherylynn Ridges and Carolynn Comment REFERRING MD:  Heath Lark, MD ASA CLASS:        Class III  INDICATIONS:  The patient is a 80 yr old female here for an enteroscopy procedure due to iron deficiency anemia and history of gastric and small bowel angiodysplastic lesions.  PROCEDURE PERFORMED:     Small bowel enteroscopy with ablation therapy  MEDICATIONS:     Monitored anesthesia care and Per Anesthesia  CONSENT: The patient understands the risks and benefits of the procedure and understands that these risks include, but are not limited to: sedation, allergic reaction, infection, perforation and/or bleeding. Alternative means of evaluation and treatment include, among others: physical exam, x-rays, and/or surgical intervention. The patient elects to proceed with this endoscopic procedure.  DESCRIPTION OF PROCEDURE: During intra-op preparation period all mechanical & medical equipment was checked for proper function. Hand hygiene and appropriate measures for infection prevention was taken. After the risks, benefits and alternatives of the procedure were thoroughly explained, Informed consent was verified, confirmed and timeout was successfully executed by the treatment team. The Pentax VSB-2900 endoscope was introduced through the mouth and advanced to the proximal jejunum jejunum. The prep was good. The instrument was then slowly withdrawn while examining the mucosa circumferentially. The scope was then completely withdrawn from the patient and the procedure terminated. The pulse, BP, and O2 saturation were monitored and documented by the physician  and the nursing staff throughout the entire procedure.  The patient was cared for as planned according to standard protocol, then discharged to recovery in stable condition and with appropriate post procedure care. Estimated blood loss is zero unless otherwise noted in this procedure report.  ESOPHAGUS: The mucosa of the esophagus appeared normal.  STOMACH: The mucosa of the stomach appeared normal.  The stomach has a j-shape resulting in looping during deep enteroscopy.  DUODENUM/JEJUNUM: 8 angiodysplastic lesions (mostly small, 2 medium in size) were found in the distal duodenum and proximal jejunum. One was actively oozing..  Argon plasma coagulation (ERBE right colon setting with straight-fire catheter) was applied to the sites with good treatment effect.   The mucosa was otherwise unremarkable in the examined segment.     ADVERSE EVENTS:      There were no immediate complications.  IMPRESSIONS:     1.  The mucosa of the esophagus appeared normal 2.  The mucosa of the stomach appeared normal 3.  8  angiodysplastic lesions found and successfully ablated with APC in the distal duodenum and very proximal jejunum  RECOMMENDATIONS:     1.  Closely monitor Hgb and iron studies. Replace iron as needed.  Follow-up with hematology as scheduled. 2.  Avoid NSAIDs   _____________________________ Jerene Bears, MD eSigned:  Jerene Bears, MD 05/22/2015 10:33 AM   cc:   the patient, PCP, Dr. Alvy Bimler     PATIENT NAME:  Laura [name], Laura [name] MR#: [id number]

## 2015-05-22 NOTE — Anesthesia Preprocedure Evaluation (Addendum)
Anesthesia Evaluation  Patient identified by MRN, date of birth, ID band Patient awake    Reviewed: Allergy & Precautions, NPO status , Patient's Chart, lab work & pertinent test results  Airway Mallampati: II  TM Distance: >3 FB Neck ROM: Full    Dental  (+) Dental Advisory Given, Upper Dentures, Lower Dentures, Edentulous Lower, Edentulous Upper   Pulmonary COPD,  COPD inhaler, Current Smoker,    Pulmonary exam normal breath sounds clear to auscultation       Cardiovascular Exercise Tolerance: Poor hypertension, Pt. on medications (-) angina(-) CAD and (-) Past MI Normal cardiovascular exam Rhythm:Regular Rate:Normal     Neuro/Psych PSYCHIATRIC DISORDERS Depression  Neuromuscular disease    GI/Hepatic Neg liver ROS, GERD  Medicated,  Endo/Other  negative endocrine ROSdiabetes, Type 2, Oral Hypoglycemic AgentsHypothyroidism   Renal/GU negative Renal ROS     Musculoskeletal negative musculoskeletal ROS (+)   Abdominal   Peds  Hematology  (+) Blood dyscrasia, anemia ,   Anesthesia Other Findings Day of surgery medications reviewed with the patient.  Right sided facial droop due to Bell's Palsy  History of breast, colon cancer  Reproductive/Obstetrics                           Anesthesia Physical Anesthesia Plan  ASA: III  Anesthesia Plan: MAC   Post-op Pain Management:    Induction: Intravenous  Airway Management Planned: Nasal Cannula  Additional Equipment:   Intra-op Plan:   Post-operative Plan:   Informed Consent: I have reviewed the patients History and Physical, chart, labs and discussed the procedure including the risks, benefits and alternatives for the proposed anesthesia with the patient or authorized representative who has indicated his/her understanding and acceptance.   Dental advisory given  Plan Discussed with: CRNA and Anesthesiologist  Anesthesia Plan  Comments: (Discussed risks/benefits/alternatives to MAC sedation including need for ventilatory support, hypotension, need for conversion to general anesthesia.  All patient questions answered.  Patient wished to proceed.)        Anesthesia Quick Evaluation

## 2015-05-22 NOTE — H&P (Signed)
HPI: [name] is a 80 year old female with past medical history of small bowel angiodysplastic lesions with bleeding, iron deficiency anemia, colon cancer in 1999 who presents for small bowel enteroscopy. Very recently video capsule endoscopy performed for recurrent iron deficiency revealed bleeding in the proximal to mid small bowel felt secondary to angiodysplasias. She is followed by hematology and has received IV iron infusions. Fortunately her anemia has resolved. She denies pain. Stools have been dark not overtly bloody. She does have issues with fatigue, though most recently she's felt more energy than usual for her.. She appetite has been poor and she's had weight loss but denies nausea, vomiting, dysphagia or odynophagia.  Small bowel enteroscopy last performed May 2014. Ablation of angiodysplasia of the stomach and 3 angiodysplastic lesions in the distal duodenum.  Past Medical History  Diagnosis Date  . Type II diabetes mellitus (HCC)     neuropathy  . Hypercholesteremia   . Hypertension   . Chronic obstructive bronchitis ([place])   . Anemia   . Hypothyroidism   . Idiopathic peripheral neuropathy (HCC)   . Muscle weakness (generalized)   . Myofacial muscle pain   . Depression   . Renal colic   . Hx of vaginal bleeding   . Hx of colonic polyps   . Iron deficiency anemia secondary to blood loss (chronic) 11/17/2012  . Iron deficiency anemia secondary to blood loss (chronic) 11/17/2012  . Breast cancer ([place]) 2001    bilatera cancer; bilateral mastectomy; no chemo; 5 years of adjuvant hormonal therapy.  Was at [facility], [place].   . Cancer of colon ([place]) 1999    UVA again, not sure stage I or II, no chemo recommended.   . Acute left hemiparesis ([place]) 03/07/2014  . Bilateral hearing loss 11/15/2014  . Diverticulosis   . Tubular adenoma of colon     Past Surgical History  Procedure Laterality Date  . Partial colectomy    . Mastectomy      double  . Tonsillectomy    . Colon  surgery    . Hernia repair      umblical hernia  . Enteroscopy N/A 11/25/2012    Procedure: ENTEROSCOPY;  Surgeon: Jerene Bears, MD;  Location: [facility] ENDOSCOPY;  Service: Gastroenterology;  Laterality: N/A;  with apc     (Not in an outpatient encounter)  Allergies  Allergen Reactions  . Diclofenac Nausea And Vomiting    Family History  Problem Relation Age of Onset  . Breast cancer Maternal Aunt   . Breast cancer Daughter   . Lung cancer Brother     brother 1  . Lung cancer Brother     brother 2  . Lung cancer Sister     died from  . Colon cancer Mother 46    Social History  Substance Use Topics  . Smoking status: Light Tobacco Smoker -- 0.50 packs/day for 50 years    Types: Cigarettes    Last Attempt to Quit: 06/27/2012  . Smokeless tobacco: Never Used     Comment: Info given  . Alcohol Use: No    ROS: As per history of present illness, otherwise negative  BP 146/45 mmHg  Pulse 61  Temp(Src) 98.2 F (36.8 C) (Oral)  Resp 23  Ht 5\' 1"  (1.549 m)  Wt 94 lb (42.638 kg)  BMI 17.77 kg/m2 Gen: awake, alert, NAD HEENT: anicteric, op clear CV: RRR, no mrg Pulm: CTA b/l Abd: soft, NT/ND, +BS throughout Ext: no c/c/e Neuro: right facial palsy  RELEVANT LABS AND IMAGING: CBC    Component Value Date/Time   WBC 4.9 05/18/2015 0935   WBC 4.1 11/28/2012 1149   RBC 3.80 05/18/2015 0935   RBC 4.19 11/28/2012 1149   HGB 12.0 05/18/2015 0935   HGB 11.4* 11/28/2012 1149   HCT 37.9 05/18/2015 0935   HCT 36.8 11/28/2012 1149   PLT 208 05/18/2015 0935   PLT 195 11/28/2012 1149   MCV 99.7 05/18/2015 0935   MCV 87.8 11/28/2012 1149   MCH 31.6 05/18/2015 0935   MCH 27.2 11/28/2012 1149   MCHC 31.7 05/18/2015 0935   MCHC 31.0 11/28/2012 1149   RDW 20.1* 05/18/2015 0935   RDW 26.8* 11/28/2012 1149   LYMPHSABS 0.6* 05/18/2015 0935   LYMPHSABS 1.5 05/08/2011 1625   MONOABS 0.3 05/18/2015 0935   MONOABS 0.4 05/08/2011 1625   EOSABS 0.1 05/18/2015 0935   EOSABS 0.1  05/08/2011 1625   BASOSABS 0.0 05/18/2015 0935   BASOSABS 0.0 05/08/2011 1625    CMP     Component Value Date/Time   NA 141 04/06/2015 1000   NA 142 11/28/2012 1149   K 3.7 04/06/2015 1000   K 3.9 11/28/2012 1149   CL 108 11/28/2012 1149   CL 109* 11/24/2012 0816   CO2 28 04/06/2015 1000   CO2 25 11/28/2012 1149   GLUCOSE 80 04/06/2015 1000   GLUCOSE 99 11/28/2012 1149   GLUCOSE 102* 11/24/2012 0816   BUN 8.6 04/06/2015 1000   BUN 11 11/28/2012 1149   CREATININE 0.7 04/06/2015 1000   CREATININE 0.63 11/28/2012 1149   CALCIUM 9.3 04/06/2015 1000   CALCIUM 9.6 11/28/2012 1149   PROT 7.0 04/06/2015 1000   ALBUMIN 3.8 04/06/2015 1000   AST 16 04/06/2015 1000   ALT 9 04/06/2015 1000   ALKPHOS 96 04/06/2015 1000   BILITOT <0.30 04/06/2015 1000   GFRNONAA 86* 11/28/2012 1149   GFRAA >90 11/28/2012 1149    ASSESSMENT/PLAN: 80 year old female with past medical history of small bowel angiodysplastic lesions with bleeding, iron deficiency anemia, colon cancer in 1999 who presents for small bowel enteroscopy.   1. History of gastric and small bowel angiodysplastic lesions/abnormal video capsule endoscopy/iron deficiency anemia -- plan for small bowel enteroscopy with APC if angiodysplastic lesions can be found. She is following closely with hematology and receiving IV iron as needed. Hemoglobin fortunately, most recently, has normalized. The nature of the procedure, as well as the risks, benefits, and alternatives were carefully and thoroughly reviewed with the patient. Ample time for discussion and questions allowed. The patient understood, was satisfied, and agreed to proceed.

## 2015-05-22 NOTE — Transfer of Care (Signed)
Immediate Anesthesia Transfer of Care Note  Patient: Laura [name]  Procedure(s) Performed: Procedure(s): ENTEROSCOPY (N/A)  Patient Location: PACU  Anesthesia Type:MAC  Level of Consciousness:  sedated, patient cooperative and responds to stimulation  Airway & Oxygen Therapy:Patient Spontanous Breathing and Patient connected to face mask oxgen  Post-op Assessment:  Report given to PACU RN and Post -op Vital signs reviewed and stable  Post vital signs:  Reviewed and stable  Last Vitals:  Filed Vitals:   05/22/15 0910  BP: 146/45  Pulse: 61  Temp: 36.8 C  Resp: 23    Complications: No apparent anesthesia complications

## 2015-05-22 NOTE — Discharge Instructions (Addendum)
YOU HAD AN ENDOSCOPIC PROCEDURE TODAY: Refer to the procedure report that was given to you for any specific questions about what was found during the examination.  If the procedure report does not answer your questions, please call your gastroenterologist to clarify.  YOU SHOULD EXPECT: Some feelings of bloating in the abdomen. Passage of more gas than usual.  Walking can help get rid of the air that was put into your GI tract during the procedure and reduce the bloating. If you had a lower endoscopy (such as a colonoscopy or flexible sigmoidoscopy) you may notice spotting of blood in your stool or on the toilet paper.   DIET: Your first meal following the procedure should be a light meal and then it is ok to progress to your normal diet.  A half-sandwich or bowl of soup is an example of a good first meal.  Heavy or fried foods are harder to digest and may make you feel nasueas or bloated.  Drink plenty of fluids but you should avoid alcoholic beverages for 24 hours.  ACTIVITY: Your care partner should take you home directly after the procedure.  You should plan to take it easy, moving slowly for the rest of the day.  You can resume normal activity the day after the procedure however you should NOT DRIVE or use heavy machinery for 24 hours (because of the sedation medicines used during the test).    SYMPTOMS TO REPORT IMMEDIATELY  A gastroenterologist can be reached at any hour.  Please call your doctor's office for any of the following symptoms:   Following lower endoscopy (colonoscopy, flexible sigmoidoscopy)  Excessive amounts of blood in the stool  Significant tenderness, worsening of abdominal pains  Swelling of the abdomen that is new, acute  Fever of 100 or higher  Following upper endoscopy (EGD, EUS, ERCP)  Vomiting of blood or coffee ground material  New, significant abdominal pain  New, significant chest pain or pain under the shoulder blades  Painful or persistently difficult  swallowing  New shortness of breath  Black, tarry-looking stools  FOLLOW UP: If any biopsies were taken you will be contacted by phone or by letter within the next 1-3 weeks.  Call your gastroenterologist if you have not heard about the biopsies in 3 weeks.  Please also call your gastroenterologist's office with any specific questions about appointments or follow up tests.   Moderate Conscious Sedation, Adult, Care After Refer to this sheet in the next few weeks. These instructions provide you with information on caring for yourself after your procedure. Your health care provider may also give you more specific instructions. Your treatment has been planned according to current medical practices, but problems sometimes occur. Call your health care provider if you have any problems or questions after your procedure. WHAT TO EXPECT AFTER THE PROCEDURE  After your procedure:  You may feel sleepy, clumsy, and have poor balance for several hours.  Vomiting may occur if you eat too soon after the procedure. HOME CARE INSTRUCTIONS  Do not participate in any activities where you could become injured for at least 24 hours. Do not:  Drive.  Swim.  Ride a bicycle.  Operate heavy machinery.  Cook.  Use power tools.  Climb ladders.  Work from a high place.  Do not make important decisions or sign legal documents until you are improved.  If you vomit, drink water, juice, or soup when you can drink without vomiting. Make sure you have little or no nausea  before eating solid foods.  Only take over-the-counter or prescription medicines for pain, discomfort, or fever as directed by your health care provider.  Make sure you and your family fully understand everything about the medicines given to you, including what side effects may occur.  You should not drink alcohol, take sleeping pills, or take medicines that cause drowsiness for at least 24 hours.  If you smoke, do not smoke without  supervision.  If you are feeling better, you may resume normal activities 24 hours after you were sedated.  Keep all appointments with your health care provider. SEEK MEDICAL CARE IF:  Your skin is pale or bluish in color.  You continue to feel nauseous or vomit.  Your pain is getting worse and is not helped by medicine.  You have bleeding or swelling.  You are still sleepy or feeling clumsy after 24 hours. SEEK IMMEDIATE MEDICAL CARE IF:  You develop a rash.  You have difficulty breathing.  You develop any type of allergic problem.  You have a fever. MAKE SURE YOU:  Understand these instructions.  Will watch your condition.  Will get help right away if you are not doing well or get worse.   This information is not intended to replace advice given to you by your health care provider. Make sure you discuss any questions you have with your health care provider.   Document Released: 04/06/2013 Document Revised: 07/07/2014 Document Reviewed: 04/06/2013 Elsevier Interactive Patient Education [employer].

## 2015-05-22 NOTE — Anesthesia Postprocedure Evaluation (Signed)
Anesthesia Post Note  Patient: Laura [name]  Procedure(s) Performed: Procedure(s) (LRB): ENTEROSCOPY (N/A)  Patient location during evaluation: PACU Anesthesia Type: MAC Level of consciousness: awake and alert Pain management: pain level controlled Vital Signs Assessment: post-procedure vital signs reviewed and stable Respiratory status: spontaneous breathing, nonlabored ventilation, respiratory function stable and patient connected to nasal cannula oxygen Cardiovascular status: stable and blood pressure returned to baseline Anesthetic complications: no    Last Vitals:  Filed Vitals:   05/22/15 1030 05/22/15 1040  BP: 110/73 106/78  Pulse: 56 57  Temp:    Resp: 27 23    Last Pain: There were no vitals filed for this visit.               Catalina Gravel

## 2015-05-23 ENCOUNTER — Encounter (HOSPITAL_COMMUNITY): Payer: Self-pay | Admitting: Internal Medicine

## 2015-05-23 ENCOUNTER — Encounter: Payer: Self-pay | Admitting: Skilled Nursing Facility1

## 2015-05-23 NOTE — Progress Notes (Signed)
Subjective:     Patient ID: Laura [name], female   DOB: 1935-06-08, 80 y.o.   MRN: [id number]  HPI   Review of Systems     Objective:   Physical Exam To assist the pt identifying some dietary strategies to gain some lost wt back.     Assessment:     Pt identified as being malnourished due to losing some wt. Pt was contacted via the telephone at [phone]. Pt states she is doing well and is very hungry right now. Pt states she woke up hungry and is about to go eat some eggs and toast. Pt states she can always eat at least 2 meals every day. Pt only had one question which was if she can eat rotisserie chicken. Pt states she does not want any information sent to her.    Plan:     Dietitian stated she can eat rotisserie chicken.

## 2015-05-25 ENCOUNTER — Ambulatory Visit: Payer: Medicare Other

## 2015-06-05 ENCOUNTER — Encounter: Payer: Self-pay | Admitting: Internal Medicine

## 2015-06-05 ENCOUNTER — Ambulatory Visit (INDEPENDENT_AMBULATORY_CARE_PROVIDER_SITE_OTHER): Payer: Medicare Other | Admitting: Internal Medicine

## 2015-06-05 VITALS — BP 120/60 | HR 72 | Ht 61.0 in | Wt 95.2 lb

## 2015-06-05 DIAGNOSIS — D509 Iron deficiency anemia, unspecified: Secondary | ICD-10-CM | POA: Diagnosis not present

## 2015-06-05 DIAGNOSIS — K5520 Angiodysplasia of colon without hemorrhage: Secondary | ICD-10-CM

## 2015-06-05 NOTE — Patient Instructions (Signed)
Follow up as needed

## 2015-06-05 NOTE — Progress Notes (Signed)
Subjective:    Patient ID: Laura [name], female    DOB: 16-Aug-1934, 80 y.o.   MRN: [id number]  HPI Laura [name] is a 80 year old female with history of gastric and small bowel angiodysplastic lesions with bleeding, iron deficiency anemia who is here for follow-up. She is here today with her daughter. She also has a history of remote colon cancer, colonic polyps, diabetes, hypertension, COPD, hypothyroidism, breast cancer in remission. She is followed in hematology by Dr. Alvy Bimler and recently sent back for IDA with heme positive stool. Video capsule endoscopy was performed in early November which showed multiple angiodysplastic lesions in the proximal small bowel. She came for deep enteroscopy on 05/22/2015. This revealed 8 small bowel angiodysplastic lesions which were ablated with APC  Since this procedure she reports she is actually feeling better. She for 2 days she had abdominal bloating and abdominal soreness but this has resolved. She feels her appetite is improving. She also feels that her energy levels are improving. Bowel movements have normalized for her and she is having easier evacuation and more formed stools. Stools remain dark but not black or tarry. No nausea or vomiting. Fatigue seems to be slowly improving.  At last check hemoglobin had improved 12.0  Review of Systems As per history of present illness, otherwise negative  Current Medications, Allergies, Past Medical History, Past Surgical History, Family History and Social History were reviewed in [employer] record.     Objective:   Physical Exam BP 120/60 mmHg  Pulse 72  Ht 5\' 1"  (1.549 m)  Wt 95 lb 3.2 oz (43.182 kg)  BMI 18.00 kg/m2 Constitutional: Well-developed and well-nourished. No distress. HEENT: Normocephalic and atraumatic.  Conjunctivae are normal.  No scleral icterus. Neck: Neck supple. Trachea midline. Cardiovascular: Normal rate, regular rhythm and intact distal pulses.    Pulmonary/chest: Effort normal and breath sounds distant. No wheezing, rales or rhonchi. Abdominal: Soft, nontender, nondistended. Bowel sounds active throughout.  Extremities: no clubbing, cyanosis, or edema Neurological: Alert and oriented to person place and time. Skin: Skin is warm and dry. Psychiatric: Normal mood and affect. Behavior is normal.  CBC    Component Value Date/Time   WBC 4.9 05/18/2015 0935   WBC 4.1 11/28/2012 1149   RBC 3.80 05/18/2015 0935   RBC 4.19 11/28/2012 1149   HGB 12.0 05/18/2015 0935   HGB 11.4* 11/28/2012 1149   HCT 37.9 05/18/2015 0935   HCT 36.8 11/28/2012 1149   PLT 208 05/18/2015 0935   PLT 195 11/28/2012 1149   MCV 99.7 05/18/2015 0935   MCV 87.8 11/28/2012 1149   MCH 31.6 05/18/2015 0935   MCH 27.2 11/28/2012 1149   MCHC 31.7 05/18/2015 0935   MCHC 31.0 11/28/2012 1149   RDW 20.1* 05/18/2015 0935   RDW 26.8* 11/28/2012 1149   LYMPHSABS 0.6* 05/18/2015 0935   LYMPHSABS 1.5 05/08/2011 1625   MONOABS 0.3 05/18/2015 0935   MONOABS 0.4 05/08/2011 1625   EOSABS 0.1 05/18/2015 0935   EOSABS 0.1 05/08/2011 1625   BASOSABS 0.0 05/18/2015 0935   BASOSABS 0.0 05/08/2011 1625    Iron/TIBC/Ferritin/ %Sat    Component Value Date/Time   IRON 48 09/01/2013 1435   IRON 17* 10/05/2012 1059   TIBC 409 09/01/2013 1435   FERRITIN 75 05/18/2015 0935   FERRITIN 2.5* 10/05/2012 1059   IRONPCTSAT 12* 09/01/2013 1435   IRONPCTSAT 2.8* 10/05/2012 1059      Assessment & Plan:  80 year old female with history of gastric and small  bowel angiodysplastic lesions with bleeding, iron deficiency anemia who is here for follow-up.   1. Small bowel angiodysplastic lesions with bleeding/IDA -- successful deep enteroscopy and ablation of 8 angiodysplastic lesions. Hopefully ablation will lead to less bleeding and therefore less iron deficiency. Fortunately her ferritin has improved at recent check as has her hemoglobin. For now she will follow-up closely with  hematology for monitoring of blood counts and iron stores. IV iron as needed.  2. History of colon cancer and adenomatous colon polyps -- she is due repeat colonoscopy in April 2017

## 2015-06-28 DIAGNOSIS — Z681 Body mass index (BMI) 19 or less, adult: Secondary | ICD-10-CM | POA: Diagnosis not present

## 2015-06-28 DIAGNOSIS — G510 Bell's palsy: Secondary | ICD-10-CM | POA: Diagnosis not present

## 2015-06-28 DIAGNOSIS — Z862 Personal history of diseases of the blood and blood-forming organs and certain disorders involving the immune mechanism: Secondary | ICD-10-CM | POA: Diagnosis not present

## 2015-06-28 DIAGNOSIS — J449 Chronic obstructive pulmonary disease, unspecified: Secondary | ICD-10-CM | POA: Diagnosis not present

## 2015-06-28 DIAGNOSIS — K219 Gastro-esophageal reflux disease without esophagitis: Secondary | ICD-10-CM | POA: Diagnosis not present

## 2015-06-28 DIAGNOSIS — F419 Anxiety disorder, unspecified: Secondary | ICD-10-CM | POA: Diagnosis not present

## 2015-06-28 DIAGNOSIS — E038 Other specified hypothyroidism: Secondary | ICD-10-CM | POA: Diagnosis not present

## 2015-06-28 DIAGNOSIS — E784 Other hyperlipidemia: Secondary | ICD-10-CM | POA: Diagnosis not present

## 2015-06-28 DIAGNOSIS — Z1389 Encounter for screening for other disorder: Secondary | ICD-10-CM | POA: Diagnosis not present

## 2015-06-28 DIAGNOSIS — I10 Essential (primary) hypertension: Secondary | ICD-10-CM | POA: Diagnosis not present

## 2015-06-28 DIAGNOSIS — E119 Type 2 diabetes mellitus without complications: Secondary | ICD-10-CM | POA: Diagnosis not present

## 2015-06-28 DIAGNOSIS — E1140 Type 2 diabetes mellitus with diabetic neuropathy, unspecified: Secondary | ICD-10-CM | POA: Diagnosis not present

## 2015-07-03 ENCOUNTER — Telehealth: Payer: Self-pay | Admitting: Hematology and Oncology

## 2015-07-03 NOTE — Telephone Encounter (Signed)
Faxed pt medical records electronic [phone] to Dr. Ardeth Perfect

## 2015-07-03 NOTE — Progress Notes (Signed)
Patient sent in authorization to release medical records to [facility] to Dr. [name]. Patient has seen Dr.Yan for the last three to four office visits. Request given to Butch Penny in medical records.

## 2015-07-13 ENCOUNTER — Other Ambulatory Visit: Payer: Medicare Other

## 2015-07-13 ENCOUNTER — Other Ambulatory Visit: Payer: Self-pay | Admitting: Hematology and Oncology

## 2015-07-13 DIAGNOSIS — D500 Iron deficiency anemia secondary to blood loss (chronic): Secondary | ICD-10-CM

## 2015-08-31 ENCOUNTER — Encounter: Payer: Self-pay | Admitting: Internal Medicine

## 2015-09-14 ENCOUNTER — Other Ambulatory Visit: Payer: Medicare Other

## 2015-09-19 DIAGNOSIS — R8299 Other abnormal findings in urine: Secondary | ICD-10-CM | POA: Diagnosis not present

## 2015-09-19 DIAGNOSIS — E784 Other hyperlipidemia: Secondary | ICD-10-CM | POA: Diagnosis not present

## 2015-09-19 DIAGNOSIS — E119 Type 2 diabetes mellitus without complications: Secondary | ICD-10-CM | POA: Diagnosis not present

## 2015-09-19 DIAGNOSIS — E038 Other specified hypothyroidism: Secondary | ICD-10-CM | POA: Diagnosis not present

## 2015-09-19 DIAGNOSIS — N390 Urinary tract infection, site not specified: Secondary | ICD-10-CM | POA: Diagnosis not present

## 2015-10-04 DIAGNOSIS — C189 Malignant neoplasm of colon, unspecified: Secondary | ICD-10-CM | POA: Diagnosis not present

## 2015-10-04 DIAGNOSIS — J449 Chronic obstructive pulmonary disease, unspecified: Secondary | ICD-10-CM | POA: Diagnosis not present

## 2015-10-04 DIAGNOSIS — Z23 Encounter for immunization: Secondary | ICD-10-CM | POA: Diagnosis not present

## 2015-10-04 DIAGNOSIS — Z0000 Encounter for general adult medical examination without abnormal findings: Secondary | ICD-10-CM | POA: Diagnosis not present

## 2015-10-04 DIAGNOSIS — M791 Myalgia: Secondary | ICD-10-CM | POA: Diagnosis not present

## 2015-10-04 DIAGNOSIS — D509 Iron deficiency anemia, unspecified: Secondary | ICD-10-CM | POA: Diagnosis not present

## 2015-10-04 DIAGNOSIS — Z1389 Encounter for screening for other disorder: Secondary | ICD-10-CM | POA: Diagnosis not present

## 2015-10-04 DIAGNOSIS — R5383 Other fatigue: Secondary | ICD-10-CM | POA: Diagnosis not present

## 2015-10-04 DIAGNOSIS — E038 Other specified hypothyroidism: Secondary | ICD-10-CM | POA: Diagnosis not present

## 2015-10-04 DIAGNOSIS — I10 Essential (primary) hypertension: Secondary | ICD-10-CM | POA: Diagnosis not present

## 2015-10-04 DIAGNOSIS — E1140 Type 2 diabetes mellitus with diabetic neuropathy, unspecified: Secondary | ICD-10-CM | POA: Diagnosis not present

## 2015-10-04 DIAGNOSIS — Z681 Body mass index (BMI) 19 or less, adult: Secondary | ICD-10-CM | POA: Diagnosis not present

## 2015-10-08 ENCOUNTER — Telehealth: Payer: Self-pay | Admitting: *Deleted

## 2015-10-08 NOTE — Telephone Encounter (Signed)
Spoke with patient, states she is getting feraheme in short stay on 10/17/15. Reminded her of appt with lab and  dr Alvy Bimler 11/16/15 @ 9:30. Patient verbalized understanding.

## 2015-10-16 ENCOUNTER — Other Ambulatory Visit (HOSPITAL_COMMUNITY): Payer: Self-pay | Admitting: *Deleted

## 2015-10-17 ENCOUNTER — Ambulatory Visit (HOSPITAL_COMMUNITY)
Admission: RE | Admit: 2015-10-17 | Discharge: 2015-10-17 | Disposition: A | Discharge status: Home / Self Care | Payer: Medicare Other | Source: Ambulatory Visit | Attending: Internal Medicine | Admitting: Internal Medicine

## 2015-10-17 DIAGNOSIS — D649 Anemia, unspecified: Secondary | ICD-10-CM | POA: Diagnosis not present

## 2015-10-17 MED ORDER — SODIUM CHLORIDE 0.9 % IV SOLN
510.0000 mg | INTRAVENOUS | Status: DC
  Administered 2015-10-17: 510 mg via INTRAVENOUS
  Filled 2015-10-17: qty 17

## 2015-10-24 ENCOUNTER — Other Ambulatory Visit (HOSPITAL_COMMUNITY): Payer: Self-pay | Admitting: *Deleted

## 2015-10-25 ENCOUNTER — Ambulatory Visit (HOSPITAL_COMMUNITY)
Admission: RE | Admit: 2015-10-25 | Discharge: 2015-10-25 | Disposition: A | Discharge status: Home / Self Care | Payer: Medicare Other | Source: Ambulatory Visit | Attending: Internal Medicine | Admitting: Internal Medicine

## 2015-10-25 DIAGNOSIS — D649 Anemia, unspecified: Secondary | ICD-10-CM | POA: Diagnosis not present

## 2015-10-25 MED ORDER — SODIUM CHLORIDE 0.9 % IV SOLN
510.0000 mg | Freq: Once | INTRAVENOUS | Status: AC
  Administered 2015-10-25: 510 mg via INTRAVENOUS
  Filled 2015-10-25: qty 17

## 2015-11-16 ENCOUNTER — Ambulatory Visit (HOSPITAL_BASED_OUTPATIENT_CLINIC_OR_DEPARTMENT_OTHER): Payer: Medicare Other | Admitting: Hematology and Oncology

## 2015-11-16 ENCOUNTER — Encounter: Payer: Self-pay | Admitting: Hematology and Oncology

## 2015-11-16 ENCOUNTER — Other Ambulatory Visit (HOSPITAL_BASED_OUTPATIENT_CLINIC_OR_DEPARTMENT_OTHER): Payer: Medicare Other

## 2015-11-16 ENCOUNTER — Telehealth: Payer: Self-pay | Admitting: Hematology and Oncology

## 2015-11-16 VITALS — BP 138/47 | HR 75 | Temp 98.1°F | Resp 18 | Ht 60.0 in | Wt 91.2 lb

## 2015-11-16 DIAGNOSIS — D500 Iron deficiency anemia secondary to blood loss (chronic): Secondary | ICD-10-CM

## 2015-11-16 DIAGNOSIS — R634 Abnormal weight loss: Secondary | ICD-10-CM

## 2015-11-16 DIAGNOSIS — Z85038 Personal history of other malignant neoplasm of large intestine: Secondary | ICD-10-CM | POA: Diagnosis present

## 2015-11-16 LAB — CBC & DIFF AND RETIC
BASO%: 0.4 % (ref 0.0–2.0)
BASOS ABS: 0 10*3/uL (ref 0.0–0.1)
EOS ABS: 0 10*3/uL (ref 0.0–0.5)
EOS%: 0.8 % (ref 0.0–7.0)
HEMATOCRIT: 42.1 % (ref 34.8–46.6)
HEMOGLOBIN: 12.9 g/dL (ref 11.6–15.9)
Immature Retic Fract: 3.3 % (ref 1.60–10.00)
LYMPH%: 19.5 % (ref 14.0–49.7)
MCH: 30 pg (ref 25.1–34.0)
MCHC: 30.6 g/dL — AB (ref 31.5–36.0)
MCV: 97.9 fL (ref 79.5–101.0)
MONO#: 0.3 10*3/uL (ref 0.1–0.9)
MONO%: 5.6 % (ref 0.0–14.0)
NEUT#: 3.7 10*3/uL (ref 1.5–6.5)
NEUT%: 73.7 % (ref 38.4–76.8)
Platelets: 224 10*3/uL (ref 145–400)
RBC: 4.3 10*6/uL (ref 3.70–5.45)
RDW: 20.4 % — ABNORMAL HIGH (ref 11.2–14.5)
RETIC %: 0.8 % (ref 0.70–2.10)
RETIC CT ABS: 34.4 10*3/uL (ref 33.70–90.70)
WBC: 5 10*3/uL (ref 3.9–10.3)
lymph#: 1 10*3/uL (ref 0.9–3.3)

## 2015-11-16 LAB — FERRITIN: Ferritin: 126 ng/ml (ref 9–269)

## 2015-11-16 NOTE — Assessment & Plan Note (Signed)
She had significant deconditioning and weakness due to recurrence of anemia last year. Since then, she was not able to gain much weight. She felt hungry and craves certain food but is not able to eat them due to imposed dietary restriction with low carbohydrate, low-fat and low-salt food, due to comorbidities with diabetes, hypertension and hyperlipidemia Due to recent significant weight loss, I recommend changing her diet to a liberal, regular diet as tolerated. While low-fat, low sugar and low-salt food are important to prevent heart attack and stroke in the future, with her profound recent weight loss, those dietary restrictions do not make sense at this moment in time. I will see her back in 3 months for further assessment but in the meantime recommend changing her diet to regular diet. Once her weight exceeded over 100 pounds, we can consider switching back to her current diet with restrictions

## 2015-11-16 NOTE — Progress Notes (Signed)
[facility] OFFICE PROGRESS NOTE  Patient Care Team: [name], MD as PCP - General (Internal Medicine) Jerene Bears, MD as Attending Physician (Gastroenterology) Heath Lark, MD as Consulting Physician (Hematology and Oncology)  SUMMARY OF ONCOLOGIC HISTORY:  This is a pleasant lady with background history of chronic GI bleed. She had remote history of colon cancer diagnosed in 1999 status post resection. She also had remote history of breast cancer diagnosed in 2000, neither records were available. She had EGD and colonoscopy in 2014 which showed angioectasias and multiple benign polyps. In May of 2014, November 2014, March 2015, November 2015, and October 2016 she received intravenous iron infusion with improvement of her hemoglobin. She received further doses on 10/17/2015 and 10/25/2015  INTERVAL HISTORY: Please see below for problem oriented charting. She feels weak and tired. She continues to have mild weight loss. She felt hungry but has poor appetite due to dietary restriction. Denies dysphagia. The patient denies any recent signs or symptoms of bleeding such as spontaneous epistaxis, hematuria or hematochezia.   REVIEW OF SYSTEMS:   Constitutional: Denies fevers, chills  Eyes: Denies blurriness of vision Ears, nose, mouth, throat, and face: Denies mucositis or sore throat Respiratory: Denies cough, dyspnea or wheezes Cardiovascular: Denies palpitation, chest discomfort or lower extremity swelling Gastrointestinal:  Denies nausea, heartburn or change in bowel habits Skin: Denies abnormal skin rashes Lymphatics: Denies new lymphadenopathy or easy bruising Neurological:Denies numbness, tingling or new weaknesses Behavioral/Psych: Mood is stable, no new changes  All other systems were reviewed with the patient and are negative.  I have reviewed the past medical history, past surgical history, social history and family history with the patient and they are  unchanged from previous note.  ALLERGIES:  is allergic to diclofenac.  MEDICATIONS:  Current Outpatient Prescriptions  Medication Sig Dispense Refill  . ALPRAZolam (XANAX) 0.5 MG tablet TK 1 T PO TID PRA / SLEEP  0  . BREO ELLIPTA 100-25 MCG/INH AEPB INL 1 PUFF PO D  3  . gabapentin (NEURONTIN) 100 MG capsule Take 100 mg by mouth 2 (two) times daily.    Marland Kitchen levothyroxine (SYNTHROID, LEVOTHROID) 50 MCG tablet Take 50 mcg by mouth daily.      Marland Kitchen lidocaine (LIDODERM) 5 % Place 1 patch onto the skin daily as needed (pain). Legs or back.    Marland Kitchen lisinopril (PRINIVIL,ZESTRIL) 5 MG tablet Take 5 mg by mouth daily.     . metFORMIN (GLUCOPHAGE) 500 MG tablet Take 1 tablet (500 mg total) by mouth 2 (two) times daily with a meal. 70 tablet 0  . Multiple Vitamin (MULTIVITAMIN WITH MINERALS) TABS tablet Take 1 tablet by mouth daily.    Marland Kitchen omeprazole (PRILOSEC) 40 MG capsule Take 1 capsule (40 mg total) by mouth daily. 30 capsule 6  . Polyethyl Glycol-Propyl Glycol (SYSTANE OP) Place 1 drop into both eyes 2 (two) times daily as needed (dry eyes).    . simvastatin (ZOCOR) 10 MG tablet Take 1 tablet (10 mg total) by mouth at bedtime. 40 tablet 0  . vitamin B-12 (CYANOCOBALAMIN) 500 MCG tablet Take 500 mcg by mouth daily.     No current facility-administered medications for this visit.    PHYSICAL EXAMINATION: ECOG PERFORMANCE STATUS: 1 - Symptomatic but completely ambulatory  Filed Vitals:   11/16/15 0853  BP: 138/47  Pulse: 75  Temp: 98.1 F (36.7 C)  Resp: 18   Filed Weights   11/16/15 0853  Weight: 91 lb 3.2 oz (41.368 kg)  GENERAL:alert, no distress and comfortable SKIN: skin color, texture, turgor are normal, no rashes or significant lesions EYES: normal, Conjunctiva are pink and non-injected, sclera clear Musculoskeletal:no cyanosis of digits and no clubbing  NEURO: alert & oriented x 3 with fluent speech, With persistent [employer] policy affecting the right side of her face  LABORATORY  DATA:  I have reviewed the data as listed    Component Value Date/Time   NA 141 04/06/2015 1000   NA 142 11/28/2012 1149   K 3.7 04/06/2015 1000   K 3.9 11/28/2012 1149   CL 108 11/28/2012 1149   CL 109* 11/24/2012 0816   CO2 28 04/06/2015 1000   CO2 25 11/28/2012 1149   GLUCOSE 80 04/06/2015 1000   GLUCOSE 99 11/28/2012 1149   GLUCOSE 102* 11/24/2012 0816   BUN 8.6 04/06/2015 1000   BUN 11 11/28/2012 1149   CREATININE 0.7 04/06/2015 1000   CREATININE 0.63 11/28/2012 1149   CALCIUM 9.3 04/06/2015 1000   CALCIUM 9.6 11/28/2012 1149   PROT 7.0 04/06/2015 1000   ALBUMIN 3.8 04/06/2015 1000   AST 16 04/06/2015 1000   ALT 9 04/06/2015 1000   ALKPHOS 96 04/06/2015 1000   BILITOT <0.30 04/06/2015 1000   GFRNONAA 86* 11/28/2012 1149   GFRAA >90 11/28/2012 1149    No results found for: SPEP, UPEP  Lab Results  Component Value Date   WBC 5.0 11/16/2015   NEUTROABS 3.7 11/16/2015   HGB 12.9 11/16/2015   HCT 42.1 11/16/2015   MCV 97.9 11/16/2015   PLT 224 11/16/2015      Chemistry      Component Value Date/Time   NA 141 04/06/2015 1000   NA 142 11/28/2012 1149   K 3.7 04/06/2015 1000   K 3.9 11/28/2012 1149   CL 108 11/28/2012 1149   CL 109* 11/24/2012 0816   CO2 28 04/06/2015 1000   CO2 25 11/28/2012 1149   BUN 8.6 04/06/2015 1000   BUN 11 11/28/2012 1149   CREATININE 0.7 04/06/2015 1000   CREATININE 0.63 11/28/2012 1149      Component Value Date/Time   CALCIUM 9.3 04/06/2015 1000   CALCIUM 9.6 11/28/2012 1149   ALKPHOS 96 04/06/2015 1000   AST 16 04/06/2015 1000   ALT 9 04/06/2015 1000   BILITOT <0.30 04/06/2015 1000     ASSESSMENT & PLAN:  Iron deficiency anemia due to chronic blood loss Her anemia has resolved.  She was seen by her GI physician and had endoscopy evaluation in November 2016 which revealed angiodysplasia that was successfully treated I will monitor her blood count closely every 3 months with blood draw.   Weight loss She had  significant deconditioning and weakness due to recurrence of anemia last year. Since then, she was not able to gain much weight. She felt hungry and craves certain food but is not able to eat them due to imposed dietary restriction with low carbohydrate, low-fat and low-salt food, due to comorbidities with diabetes, hypertension and hyperlipidemia Due to recent significant weight loss, I recommend changing her diet to a liberal, regular diet as tolerated. While low-fat, low sugar and low-salt food are important to prevent heart attack and stroke in the future, with her profound recent weight loss, those dietary restrictions do not make sense at this moment in time. I will see her back in 3 months for further assessment but in the meantime recommend changing her diet to regular diet. Once her weight exceeded over 100 pounds, we can  consider switching back to her current diet with restrictions   No orders of the defined types were placed in this encounter.   All questions were answered. The patient knows to call the clinic with any problems, questions or concerns. No barriers to learning was detected. I spent 15 minutes counseling the patient face to face. The total time spent in the appointment was 20 minutes and more than 50% was on counseling and review of test results     [facility], [place], MD 11/16/2015 9:31 AM

## 2015-11-16 NOTE — Telephone Encounter (Signed)
Gave and printed appt sched and avs for pt for Aug °

## 2015-11-16 NOTE — Assessment & Plan Note (Signed)
Her anemia has resolved.  She was seen by her GI physician and had endoscopy evaluation in November 2016 which revealed angiodysplasia that was successfully treated I will monitor her blood count closely every 3 months with blood draw.

## 2015-12-24 DIAGNOSIS — Z681 Body mass index (BMI) 19 or less, adult: Secondary | ICD-10-CM | POA: Diagnosis not present

## 2015-12-24 DIAGNOSIS — R29810 Facial weakness: Secondary | ICD-10-CM | POA: Diagnosis not present

## 2015-12-24 DIAGNOSIS — G510 Bell's palsy: Secondary | ICD-10-CM | POA: Diagnosis not present

## 2015-12-24 DIAGNOSIS — J449 Chronic obstructive pulmonary disease, unspecified: Secondary | ICD-10-CM | POA: Diagnosis not present

## 2015-12-30 ENCOUNTER — Other Ambulatory Visit: Payer: Self-pay | Admitting: Hematology and Oncology

## 2016-01-04 DIAGNOSIS — Z681 Body mass index (BMI) 19 or less, adult: Secondary | ICD-10-CM | POA: Diagnosis not present

## 2016-01-04 DIAGNOSIS — E784 Other hyperlipidemia: Secondary | ICD-10-CM | POA: Diagnosis not present

## 2016-01-04 DIAGNOSIS — I10 Essential (primary) hypertension: Secondary | ICD-10-CM | POA: Diagnosis not present

## 2016-01-04 DIAGNOSIS — E119 Type 2 diabetes mellitus without complications: Secondary | ICD-10-CM | POA: Diagnosis not present

## 2016-02-06 ENCOUNTER — Telehealth: Payer: Self-pay | Admitting: *Deleted

## 2016-02-06 NOTE — Telephone Encounter (Signed)
OK to reschedule, non-urgent, any 30 minutes on days that work for her

## 2016-02-06 NOTE — Telephone Encounter (Signed)
Pt states she cannot keep appt on 8/18 as scheduled due to her daughter being out of town.  Can she come during the week of 9/4?

## 2016-02-06 NOTE — Telephone Encounter (Signed)
Scheduling message sent to r/s lab/MD from 8/18 to week of 9/4 per pt request.

## 2016-02-08 ENCOUNTER — Telehealth: Payer: Self-pay | Admitting: Hematology and Oncology

## 2016-02-08 NOTE — Telephone Encounter (Signed)
spoke w/ pt confirmed apts on 9/5

## 2016-02-15 ENCOUNTER — Other Ambulatory Visit: Payer: Medicare Other

## 2016-02-15 ENCOUNTER — Ambulatory Visit: Payer: Medicare Other | Admitting: Hematology and Oncology

## 2016-03-04 ENCOUNTER — Ambulatory Visit (HOSPITAL_BASED_OUTPATIENT_CLINIC_OR_DEPARTMENT_OTHER): Payer: Medicare Other | Admitting: Hematology and Oncology

## 2016-03-04 ENCOUNTER — Encounter: Payer: Self-pay | Admitting: Hematology and Oncology

## 2016-03-04 ENCOUNTER — Other Ambulatory Visit (HOSPITAL_BASED_OUTPATIENT_CLINIC_OR_DEPARTMENT_OTHER): Payer: Medicare Other

## 2016-03-04 DIAGNOSIS — R634 Abnormal weight loss: Secondary | ICD-10-CM

## 2016-03-04 DIAGNOSIS — D500 Iron deficiency anemia secondary to blood loss (chronic): Secondary | ICD-10-CM | POA: Diagnosis not present

## 2016-03-04 LAB — CBC & DIFF AND RETIC
BASO%: 0.4 % (ref 0.0–2.0)
Basophils Absolute: 0 10*3/uL (ref 0.0–0.1)
EOS ABS: 0.1 10*3/uL (ref 0.0–0.5)
EOS%: 1.3 % (ref 0.0–7.0)
HCT: 35.4 % (ref 34.8–46.6)
HGB: 11.5 g/dL — ABNORMAL LOW (ref 11.6–15.9)
IMMATURE RETIC FRACT: 8.2 % (ref 1.60–10.00)
LYMPH#: 0.7 10*3/uL — AB (ref 0.9–3.3)
LYMPH%: 15.3 % (ref 14.0–49.7)
MCH: 30.9 pg (ref 25.1–34.0)
MCHC: 32.5 g/dL (ref 31.5–36.0)
MCV: 95.2 fL (ref 79.5–101.0)
MONO#: 0.4 10*3/uL (ref 0.1–0.9)
MONO%: 8.8 % (ref 0.0–14.0)
NEUT%: 74.2 % (ref 38.4–76.8)
NEUTROS ABS: 3.5 10*3/uL (ref 1.5–6.5)
Platelets: 267 10*3/uL (ref 145–400)
RBC: 3.72 10*6/uL (ref 3.70–5.45)
RDW: 13.5 % (ref 11.2–14.5)
RETIC %: 1.19 % (ref 0.70–2.10)
RETIC CT ABS: 44.27 10*3/uL (ref 33.70–90.70)
WBC: 4.7 10*3/uL (ref 3.9–10.3)

## 2016-03-04 LAB — FERRITIN: FERRITIN: 11 ng/mL (ref 9–269)

## 2016-03-04 NOTE — Progress Notes (Signed)
[facility] NOTE  [name], MD SUMMARY OF HEMATOLOGIC HISTORY:  This is a pleasant lady with background history of chronic GI bleed. She had remote history of colon cancer diagnosed in 1999 status post resection. She also had remote history of breast cancer diagnosed in 2000, neither records were available. She had EGD and colonoscopy in 2014 which showed angioectasias and multiple benign polyps. In May of 2014, November 2014, March 2015, November 2015, and October 2016 she received intravenous iron infusion with improvement of her hemoglobin. She received further doses on 10/17/2015 and 10/25/2015  INTERVAL HISTORY: Please see below for problem oriented charting. She denies feeling weak and tired. She continues to have mild weight loss. Denies dysphagia. She complained of diarrhea after recent nutritional supplement with Carnation breakfast The patient denies any recent signs or symptoms of bleeding such as spontaneous epistaxis, hematuria or hematochezia.  I have reviewed the past medical history, past surgical history, social history and family history with the patient and they are unchanged from previous note.  ALLERGIES:  is allergic to diclofenac.  MEDICATIONS:  Current Outpatient Prescriptions  Medication Sig Dispense Refill  . ALPRAZolam (XANAX) 0.5 MG tablet TK 1 T PO TID PRA / SLEEP  0  . BREO ELLIPTA 100-25 MCG/INH AEPB INL 1 PUFF PO D  3  . gabapentin (NEURONTIN) 100 MG capsule Take 100 mg by mouth 2 (two) times daily.    Marland Kitchen levothyroxine (SYNTHROID, LEVOTHROID) 50 MCG tablet Take 50 mcg by mouth daily.      Marland Kitchen lidocaine (LIDODERM) 5 % Place 1 patch onto the skin daily as needed (pain). Legs or back.    . Multiple Vitamin (MULTIVITAMIN WITH MINERALS) TABS tablet Take 1 tablet by mouth daily.    Vladimir Faster Glycol-Propyl Glycol (SYSTANE OP) Place 1 drop into both eyes 2 (two) times daily as needed (dry eyes).    . vitamin B-12  (CYANOCOBALAMIN) 500 MCG tablet Take 500 mcg by mouth daily.     No current facility-administered medications for this visit.      REVIEW OF SYSTEMS:   Constitutional: Denies fevers, chills or night sweats Eyes: Denies blurriness of vision Ears, nose, mouth, throat, and face: Denies mucositis or sore throat Respiratory: Denies cough, dyspnea or wheezes Cardiovascular: Denies palpitation, chest discomfort or lower extremity swelling Skin: Denies abnormal skin rashes Lymphatics: Denies new lymphadenopathy or easy bruising Neurological:Denies numbness, tingling or new weaknesses Behavioral/Psych: Mood is stable, no new changes  All other systems were reviewed with the patient and are negative.  PHYSICAL EXAMINATION: ECOG PERFORMANCE STATUS: 1 - Symptomatic but completely ambulatory  Vitals:   03/04/16 1158  BP: 101/89  Pulse: 92  Resp: 17  Temp: 98.6 F (37 C)   Filed Weights   03/04/16 1158  Weight: 86 lb 12.8 oz (39.4 kg)    GENERAL:alert, no distress and comfortable. She looks thin SKIN: skin color, texture, turgor are normal, no rashes or significant lesions EYES: normal, Conjunctiva are pink and non-injected, sclera clear Musculoskeletal:no cyanosis of digits and no clubbing  NEURO: alert & oriented x 3 with Mild dysarthria due to persistent facial weakness from [employer] policy  LABORATORY DATA:  I have reviewed the data as listed     Component Value Date/Time   NA 141 04/06/2015 1000   K 3.7 04/06/2015 1000   CL 108 11/28/2012 1149   CL 109 (H) 11/24/2012 0816   CO2 28 04/06/2015 1000   GLUCOSE 80 04/06/2015 1000  GLUCOSE 102 (H) 11/24/2012 0816   BUN 8.6 04/06/2015 1000   CREATININE 0.7 04/06/2015 1000   CALCIUM 9.3 04/06/2015 1000   PROT 7.0 04/06/2015 1000   ALBUMIN 3.8 04/06/2015 1000   AST 16 04/06/2015 1000   ALT 9 04/06/2015 1000   ALKPHOS 96 04/06/2015 1000   BILITOT <0.30 04/06/2015 1000   GFRNONAA 86 (L) 11/28/2012 1149   GFRAA >90 11/28/2012  1149    No results found for: SPEP, UPEP  Lab Results  Component Value Date   WBC 4.7 03/04/2016   NEUTROABS 3.5 03/04/2016   HGB 11.5 (L) 03/04/2016   HCT 35.4 03/04/2016   MCV 95.2 03/04/2016   PLT 267 03/04/2016      Chemistry      Component Value Date/Time   NA 141 04/06/2015 1000   K 3.7 04/06/2015 1000   CL 108 11/28/2012 1149   CL 109 (H) 11/24/2012 0816   CO2 28 04/06/2015 1000   BUN 8.6 04/06/2015 1000   CREATININE 0.7 04/06/2015 1000      Component Value Date/Time   CALCIUM 9.3 04/06/2015 1000   ALKPHOS 96 04/06/2015 1000   AST 16 04/06/2015 1000   ALT 9 04/06/2015 1000   BILITOT <0.30 04/06/2015 1000      ASSESSMENT & PLAN:  Iron deficiency anemia due to chronic blood loss She has recurrence of anemia, likely due to recurrent GI bleed She was seen by her GI physician and had endoscopy evaluation in November 2016 which revealed angiodysplasia that was successfully treated but I suspect she may be at risk of recurrent bleeding The most likely cause of her anemia is due to chronic blood loss/malabsorption syndrome. We discussed some of the risks, benefits, and alternatives of intravenous iron infusions. The patient is symptomatic from anemia and the iron level is critically low. She tolerated oral iron supplement poorly and desires to achieved higher levels of iron faster for adequate hematopoesis. Some of the side-effects to be expected including risks of infusion reactions, phlebitis, headaches, nausea and fatigue.  The patient is willing to proceed. Patient education material was dispensed.  Goal is to keep ferritin level greater than 100 After IV iron, I will monitor her blood count closely every 4- 5 months with blood draw I recommend she continue his multivitamin for effective erythropoiesis   Weight loss She had significant deconditioning and weakness due to recurrence of anemia last year. Since then, she was not able to gain much weight. She felt  hungry and craves certain food but is not able to eat them due to imposed dietary restriction with low carbohydrate, low-fat and low-salt food, due to comorbidities with diabetes, hypertension and hyperlipidemia Due to recent significant weight loss, I recommend changing her diet to a regular diet as tolerated. From her history, it appears that the patient may also have lactose intolerance We discussed choices of diet. I recommend she add additional supplement after each meals Once her weight exceeded over 100 pounds, we can consider switching back to her current diet with restrictions   All questions were answered. The patient knows to call the clinic with any problems, questions or concerns. No barriers to learning was detected.  I spent 15 minutes counseling the patient face to face. The total time spent in the appointment was 20 minutes and more than 50% was on counseling.     [facility], Sire Poet, MD 9/5/201712:32 PM

## 2016-03-04 NOTE — Assessment & Plan Note (Signed)
She had significant deconditioning and weakness due to recurrence of anemia last year. Since then, she was not able to gain much weight. She felt hungry and craves certain food but is not able to eat them due to imposed dietary restriction with low carbohydrate, low-fat and low-salt food, due to comorbidities with diabetes, hypertension and hyperlipidemia Due to recent significant weight loss, I recommend changing her diet to a regular diet as tolerated. From her history, it appears that the patient may also have lactose intolerance We discussed choices of diet. I recommend she add additional supplement after each meals Once her weight exceeded over 100 pounds, we can consider switching back to her current diet with restrictions

## 2016-03-04 NOTE — Assessment & Plan Note (Signed)
She has recurrence of anemia, likely due to recurrent GI bleed She was seen by her GI physician and had endoscopy evaluation in November 2016 which revealed angiodysplasia that was successfully treated but I suspect she may be at risk of recurrent bleeding The most likely cause of her anemia is due to chronic blood loss/malabsorption syndrome. We discussed some of the risks, benefits, and alternatives of intravenous iron infusions. The patient is symptomatic from anemia and the iron level is critically low. She tolerated oral iron supplement poorly and desires to achieved higher levels of iron faster for adequate hematopoesis. Some of the side-effects to be expected including risks of infusion reactions, phlebitis, headaches, nausea and fatigue.  The patient is willing to proceed. Patient education material was dispensed.  Goal is to keep ferritin level greater than 100 After IV iron, I will monitor her blood count closely every 4- 5 months with blood draw I recommend she continue his multivitamin for effective erythropoiesis

## 2016-03-14 ENCOUNTER — Telehealth: Payer: Self-pay | Admitting: *Deleted

## 2016-03-14 NOTE — Telephone Encounter (Signed)
Pt called to ask when her next appt is?  Says she was supposed to get a calendar in the mail but has not received it.   Informed pt of appts for Feraheme on 9/25 and 10/2.  She can get a new schedule when she comes in on 9/25.  Pt verbalized understanding.

## 2016-03-24 ENCOUNTER — Ambulatory Visit (HOSPITAL_BASED_OUTPATIENT_CLINIC_OR_DEPARTMENT_OTHER): Payer: Medicare Other

## 2016-03-24 VITALS — BP 131/35 | HR 77 | Temp 98.3°F | Resp 18

## 2016-03-24 DIAGNOSIS — Z23 Encounter for immunization: Secondary | ICD-10-CM

## 2016-03-24 DIAGNOSIS — D500 Iron deficiency anemia secondary to blood loss (chronic): Secondary | ICD-10-CM

## 2016-03-24 DIAGNOSIS — K31811 Angiodysplasia of stomach and duodenum with bleeding: Secondary | ICD-10-CM | POA: Diagnosis not present

## 2016-03-24 MED ORDER — INFLUENZA VAC SPLIT QUAD 0.5 ML IM SUSY
0.5000 mL | PREFILLED_SYRINGE | Freq: Once | INTRAMUSCULAR | Status: AC
  Administered 2016-03-24: 0.5 mL via INTRAMUSCULAR
  Filled 2016-03-24: qty 0.5

## 2016-03-24 MED ORDER — FERUMOXYTOL INJECTION 510 MG/17 ML
510.0000 mg | Freq: Once | INTRAVENOUS | Status: AC
  Administered 2016-03-24: 510 mg via INTRAVENOUS
  Filled 2016-03-24: qty 17

## 2016-03-24 MED ORDER — SODIUM CHLORIDE 0.9 % IV SOLN
Freq: Once | INTRAVENOUS | Status: AC
  Administered 2016-03-24: 14:00:00 via INTRAVENOUS

## 2016-03-30 ENCOUNTER — Other Ambulatory Visit: Payer: Self-pay

## 2016-03-30 ENCOUNTER — Emergency Department (HOSPITAL_COMMUNITY): Payer: Medicare Other

## 2016-03-30 ENCOUNTER — Observation Stay (HOSPITAL_COMMUNITY)
Admission: EM | Admit: 2016-03-30 | Discharge: 2016-04-01 | Disposition: A | Discharge status: Home / Self Care | Payer: Medicare Other | Attending: Internal Medicine | Admitting: Internal Medicine

## 2016-03-30 ENCOUNTER — Encounter (HOSPITAL_COMMUNITY): Payer: Self-pay | Admitting: Emergency Medicine

## 2016-03-30 DIAGNOSIS — Z9049 Acquired absence of other specified parts of digestive tract: Secondary | ICD-10-CM | POA: Insufficient documentation

## 2016-03-30 DIAGNOSIS — E1140 Type 2 diabetes mellitus with diabetic neuropathy, unspecified: Secondary | ICD-10-CM | POA: Insufficient documentation

## 2016-03-30 DIAGNOSIS — I10 Essential (primary) hypertension: Secondary | ICD-10-CM | POA: Diagnosis not present

## 2016-03-30 DIAGNOSIS — Z803 Family history of malignant neoplasm of breast: Secondary | ICD-10-CM | POA: Diagnosis not present

## 2016-03-30 DIAGNOSIS — I700 Atherosclerosis of aorta: Secondary | ICD-10-CM | POA: Diagnosis not present

## 2016-03-30 DIAGNOSIS — Z8679 Personal history of other diseases of the circulatory system: Secondary | ICD-10-CM | POA: Diagnosis not present

## 2016-03-30 DIAGNOSIS — Z9889 Other specified postprocedural states: Secondary | ICD-10-CM | POA: Diagnosis not present

## 2016-03-30 DIAGNOSIS — J449 Chronic obstructive pulmonary disease, unspecified: Secondary | ICD-10-CM | POA: Diagnosis not present

## 2016-03-30 DIAGNOSIS — F17200 Nicotine dependence, unspecified, uncomplicated: Secondary | ICD-10-CM | POA: Diagnosis present

## 2016-03-30 DIAGNOSIS — D500 Iron deficiency anemia secondary to blood loss (chronic): Secondary | ICD-10-CM | POA: Diagnosis not present

## 2016-03-30 DIAGNOSIS — Z79899 Other long term (current) drug therapy: Secondary | ICD-10-CM | POA: Diagnosis not present

## 2016-03-30 DIAGNOSIS — Z800 Family history of malignant neoplasm of digestive organs: Secondary | ICD-10-CM | POA: Diagnosis not present

## 2016-03-30 DIAGNOSIS — Z853 Personal history of malignant neoplasm of breast: Secondary | ICD-10-CM

## 2016-03-30 DIAGNOSIS — Z801 Family history of malignant neoplasm of trachea, bronchus and lung: Secondary | ICD-10-CM | POA: Insufficient documentation

## 2016-03-30 DIAGNOSIS — E039 Hypothyroidism, unspecified: Secondary | ICD-10-CM | POA: Diagnosis not present

## 2016-03-30 DIAGNOSIS — F329 Major depressive disorder, single episode, unspecified: Secondary | ICD-10-CM | POA: Insufficient documentation

## 2016-03-30 DIAGNOSIS — Z9013 Acquired absence of bilateral breasts and nipples: Secondary | ICD-10-CM | POA: Diagnosis not present

## 2016-03-30 DIAGNOSIS — E7800 Pure hypercholesterolemia, unspecified: Secondary | ICD-10-CM | POA: Insufficient documentation

## 2016-03-30 DIAGNOSIS — R079 Chest pain, unspecified: Secondary | ICD-10-CM | POA: Diagnosis not present

## 2016-03-30 DIAGNOSIS — F17210 Nicotine dependence, cigarettes, uncomplicated: Secondary | ICD-10-CM | POA: Diagnosis not present

## 2016-03-30 DIAGNOSIS — R0789 Other chest pain: Secondary | ICD-10-CM | POA: Diagnosis not present

## 2016-03-30 DIAGNOSIS — Z7951 Long term (current) use of inhaled steroids: Secondary | ICD-10-CM | POA: Insufficient documentation

## 2016-03-30 DIAGNOSIS — Z8774 Personal history of (corrected) congenital malformations of heart and circulatory system: Secondary | ICD-10-CM

## 2016-03-30 DIAGNOSIS — Z85038 Personal history of other malignant neoplasm of large intestine: Secondary | ICD-10-CM | POA: Diagnosis present

## 2016-03-30 DIAGNOSIS — Z886 Allergy status to analgesic agent status: Secondary | ICD-10-CM | POA: Insufficient documentation

## 2016-03-30 LAB — CBC
HCT: 36.4 % (ref 36.0–46.0)
HEMOGLOBIN: 11.7 g/dL — AB (ref 12.0–15.0)
MCH: 31.2 pg (ref 26.0–34.0)
MCHC: 32.1 g/dL (ref 30.0–36.0)
MCV: 97.1 fL (ref 78.0–100.0)
PLATELETS: 290 10*3/uL (ref 150–400)
RBC: 3.75 MIL/uL — ABNORMAL LOW (ref 3.87–5.11)
RDW: 15.1 % (ref 11.5–15.5)
WBC: 5.7 10*3/uL (ref 4.0–10.5)

## 2016-03-30 LAB — BASIC METABOLIC PANEL
Anion gap: 5 (ref 5–15)
BUN: 11 mg/dL (ref 6–20)
CALCIUM: 10 mg/dL (ref 8.9–10.3)
CO2: 29 mmol/L (ref 22–32)
CREATININE: 0.57 mg/dL (ref 0.44–1.00)
Chloride: 106 mmol/L (ref 101–111)
GFR calc Af Amer: >60 mL/min (ref >60)
GFR calc non Af Amer: >60 mL/min (ref >60)
GLUCOSE: 98 mg/dL (ref 65–99)
Potassium: 4.2 mmol/L (ref 3.5–5.1)
Sodium: 140 mmol/L (ref 135–145)

## 2016-03-30 LAB — I-STAT TROPONIN, ED: TROPONIN I, POC: 0 ng/mL (ref 0.00–0.08)

## 2016-03-30 MED ORDER — SODIUM CHLORIDE 0.9 % IV BOLUS (SEPSIS)
1000.0000 mL | Freq: Once | INTRAVENOUS | Status: AC
  Administered 2016-03-30: 1000 mL via INTRAVENOUS

## 2016-03-30 MED ORDER — ASPIRIN 81 MG PO CHEW
324.0000 mg | CHEWABLE_TABLET | Freq: Once | ORAL | Status: AC
  Administered 2016-03-30: 324 mg via ORAL
  Filled 2016-03-30: qty 4

## 2016-03-30 NOTE — ED Triage Notes (Signed)
Pt comes from home with complaints of central chest pain that radiates to the left arm that began this afternoon. Endorses dizziness and weakness. Denies N&V.

## 2016-03-30 NOTE — ED Provider Notes (Signed)
[facility] DEPT Provider Note   CSN: [id number] Arrival date & time: 03/30/16  2120     History   Chief Complaint Chief Complaint  Patient presents with  . Chest Pain    HPI Laura [name] is a 81 y.o. female.  HPI   3PM began having pain in central chest, felt like ate something and it didn't go down, but  hadn not had anything to eat for hours. Was burping, like a pressure, sharp with movements of shoulders, sharp with walking/exerting self.  Shoulder pain left arm, had been having that prior to chest pressure, nagging.  5/10. Now pain gone away.  No shortness of breath. No nausea. A little diaphoresis.  Felt lightheaded.    Took off of DM medicines months ago Hx of breast Ca but no longer on treatments, receives iron infusions Htn, hlpd now off of those medications  Too Does smoke cigarrettes  Past Medical History:  Diagnosis Date  . Acute left hemiparesis ([place]) 03/07/2014  . Anemia   . Bilateral hearing loss 11/15/2014  . Breast cancer ([place]) 2001   bilatera cancer; bilateral mastectomy; no chemo; 5 years of adjuvant hormonal therapy.  Was at [facility], [place].   . Cancer of colon ([place]) 1999   UVA again, not sure stage I or II, no chemo recommended.   . Chronic obstructive bronchitis ([place])   . Depression   . Diverticulosis   . Hx of colonic polyps   . Hx of vaginal bleeding   . Hypercholesteremia   . Hypertension   . Hypothyroidism   . Idiopathic peripheral neuropathy   . Iron deficiency anemia secondary to blood loss (chronic) 11/17/2012  . Iron deficiency anemia secondary to blood loss (chronic) 11/17/2012  . Muscle weakness (generalized)   . Myofacial muscle pain   . Renal colic   . Tubular adenoma of colon   . Type II diabetes mellitus (HCC)    neuropathy    Patient Active Problem List   Diagnosis Date Noted  . Chest pain 03/30/2016  . IDA (iron deficiency anemia)   . Angiodysplasia of duodenum with hemorrhage   . Angiodysplasia of intestine with  hemorrhage   . Essential hypertension 05/18/2015  . Anemia, iron deficiency 04/19/2015  . Heme positive stool 04/19/2015  . History of arteriovenous malformation (AVM) 04/19/2015  . Hypotension due to blood loss 04/06/2015  . Right-sided Bell's palsy 11/16/2014  . Bilateral hearing loss 11/15/2014  . Gait difficulty 03/21/2014  . Acute left hemiparesis ([place]) 03/07/2014  . Preventive measure 03/07/2014  . Insomnia 12/02/2013  . Weight loss 12/02/2013  . Gastric and duodenal angiodysplasia with hemorrhage 11/25/2012  . Iron deficiency anemia due to chronic blood loss 11/17/2012  . HYPOTHYROIDISM, POST-RADIATION 11/13/2008  . COLON CANCER, HX OF 12/10/2007  . BREAST CANCER, HX OF 12/10/2007  . History of colon cancer 12/09/2007  . History of breast cancer 12/09/2007  . DIABETES MELLITUS, TYPE II 12/09/2007  . HYPERLIPIDEMIA 12/09/2007  . TOBACCO ABUSE 12/09/2007  . DEPRESSION 12/09/2007  . PERIPHERAL NEUROPATHY 12/09/2007  . UPPER RESPIRATORY INFECTION 12/09/2007  . Backache 12/09/2007  . PALPITATIONS 12/09/2007    Past Surgical History:  Procedure Laterality Date  . COLON SURGERY    . ENTEROSCOPY N/A 11/25/2012   Procedure: ENTEROSCOPY;  Surgeon: Jerene Bears, MD;  Location: [facility];  Service: Gastroenterology;  Laterality: N/A;  with apc  . ENTEROSCOPY N/A 05/22/2015   Procedure: ENTEROSCOPY;  Surgeon: Jerene Bears, MD;  Location: [facility];  Service: Gastroenterology;  Laterality: N/A;  . HERNIA REPAIR     umblical hernia  . MASTECTOMY     double  . PARTIAL COLECTOMY    . TONSILLECTOMY      OB History    No data available       Home Medications    Prior to Admission medications   Medication Sig Start Date End Date Taking? Authorizing Provider  acetaminophen (TYLENOL) 500 MG tablet Take 500 mg by mouth every 6 (six) hours as needed for moderate pain.   Yes Historical Provider, MD  ALPRAZolam Duanne Moron) 0.5 MG tablet Take one tablet by mouth three times daily  as needed for sleep/anxiety 10/08/15  Yes Historical Provider, MD  BREO ELLIPTA 100-25 MCG/INH AEPB INL 1 PUFF PO D 10/02/15  Yes Historical Provider, MD  gabapentin (NEURONTIN) 100 MG capsule Take 100 mg by mouth 2 (two) times daily.   Yes Historical Provider, MD  levothyroxine (SYNTHROID, LEVOTHROID) 50 MCG tablet Take 50 mcg by mouth daily.     Yes Historical Provider, MD  lidocaine (LIDODERM) 5 % Place 1 patch onto the skin daily as needed (pain). Legs or back. 01/23/14  Yes Historical Provider, MD  Multiple Vitamin (MULTIVITAMIN WITH MINERALS) TABS tablet Take 1 tablet by mouth daily.   Yes Historical Provider, MD  Polyethyl Glycol-Propyl Glycol (SYSTANE OP) Place 1 drop into both eyes 2 (two) times daily as needed (dry eyes).   Yes Historical Provider, MD    Family History Family History  Problem Relation Age of Onset  . Lung cancer Brother     brother 1  . Lung cancer Sister     died from  . Colon cancer Mother 19  . Breast cancer Maternal Aunt   . Breast cancer Daughter   . Lung cancer Brother     brother 2    Social History Social History  Substance Use Topics  . Smoking status: Light Tobacco Smoker    Packs/day: 0.50    Years: 50.00    Types: Cigarettes    Last attempt to quit: 06/27/2012  . Smokeless tobacco: Never Used     Comment: Info given  . Alcohol use No     Allergies   Diclofenac   Review of Systems Review of Systems  Constitutional: Positive for diaphoresis. Negative for fever.  HENT: Negative for sore throat and voice change.   Eyes: Negative for visual disturbance.  Respiratory: Negative for cough and shortness of breath.   Cardiovascular: Positive for chest pain. Negative for leg swelling.  Gastrointestinal: Negative for abdominal pain, nausea and vomiting.  Genitourinary: Negative for difficulty urinating and dysuria.  Musculoskeletal: Negative for back pain and neck pain.  Skin: Negative for rash.  Neurological: Positive for light-headedness.  Negative for syncope, weakness, numbness and headaches.     Physical Exam Updated Vital Signs BP (!) 116/46 (BP Location: Right Arm)   Pulse 63   Temp 98.5 F (36.9 C) (Oral)   Resp 20   Ht 5' (1.524 m)   Wt 96 lb (43.5 kg)   SpO2 97%   BMI 18.75 kg/m   Physical Exam  Constitutional: She is oriented to person, place, and time. She appears well-developed and well-nourished. No distress.  HENT:  Head: Normocephalic and atraumatic.  Eyes: Conjunctivae and EOM are normal.  Neck: Normal range of motion.  Cardiovascular: Normal rate, regular rhythm, normal heart sounds and intact distal pulses.  Exam reveals no gallop and no friction rub.   No murmur  heard. Pulmonary/Chest: Effort normal and breath sounds normal. No respiratory distress. She has no wheezes. She has no rales. She exhibits tenderness.  Abdominal: Soft. She exhibits no distension. There is no tenderness. There is no guarding.  Musculoskeletal: She exhibits no edema or tenderness.  Neurological: She is alert and oriented to person, place, and time.  Skin: Skin is warm and dry. No rash noted. She is not diaphoretic. No erythema.  Nursing note and vitals reviewed.    ED Treatments / Results  Labs (all labs ordered are listed, but only abnormal results are displayed) Labs Reviewed  CBC - Abnormal; Notable for the following:       Result Value   RBC 3.75 (*)    Hemoglobin 11.7 (*)    All other components within normal limits  GLUCOSE, CAPILLARY - Abnormal; Notable for the following:    Glucose-Capillary 165 (*)    All other components within normal limits  BASIC METABOLIC PANEL  TROPONIN I  TROPONIN I  D-DIMER, QUANTITATIVE (NOT AT [facility])  GLUCOSE, CAPILLARY  TROPONIN I  I-STAT TROPOININ, ED    EKG  EKG Interpretation  Date/Time:  Sunday March 30 2016 21:28:36 EDT Ventricular Rate:  84 PR Interval:    QRS Duration: 96 QT Interval:  365 QTC Calculation: 432 R Axis:   80 Text Interpretation:  Sinus  rhythm Anteroseptal infarct, age indeterminate Baseline wander in lead(s) V5 No previous ECGs available Confirmed by [facility] MD, [place] ([id number]) on 03/30/2016 10:00:45 PM       Radiology Dg Chest 2 View  Result Date: 03/30/2016 CLINICAL DATA:  Chest pain for 4 hours radiating to the left arm. EXAM: CHEST  2 VIEW COMPARISON:  CT chest 12/09/2013 FINDINGS: Emphysematous changes in the lungs. Normal heart size and pulmonary vascularity. No focal airspace disease or consolidation in the lungs. No blunting of costophrenic angles. No pneumothorax. Mediastinal contours appear intact. Calcification of the aorta. IMPRESSION: Emphysematous changes in the lungs. No evidence of active pulmonary disease. Electronically Signed   By: Lucienne Capers M.D.   On: 03/30/2016 22:22    Procedures Procedures (including critical care time)  Medications Ordered in ED Medications  ALPRAZolam (XANAX) tablet 0.5 mg (0.5 mg Oral Given 03/31/16 0153)  fluticasone furoate-vilanterol (BREO ELLIPTA) 100-25 MCG/INH 1 puff (1 puff Inhalation Given 03/31/16 0830)  multivitamin with minerals tablet 1 tablet (1 tablet Oral Given 03/31/16 0818)  lidocaine (LIDODERM) 5 % 1 patch (not administered)  gabapentin (NEURONTIN) capsule 100 mg (100 mg Oral Given 03/31/16 0817)  levothyroxine (SYNTHROID, LEVOTHROID) tablet 50 mcg (50 mcg Oral Given 03/31/16 0818)  acetaminophen (TYLENOL) tablet 650 mg (650 mg Oral Given 03/31/16 0818)  ondansetron (ZOFRAN) injection 4 mg (not administered)  insulin aspart (novoLOG) injection 0-9 Units (1 Units Subcutaneous Given 03/31/16 1253)  nitroGLYCERIN (NITROSTAT) SL tablet 0.4 mg (not administered)  hydrALAZINE (APRESOLINE) injection 10 mg (10 mg Intravenous Given 03/31/16 0201)  sodium chloride 0.9 % bolus 1,000 mL (0 mLs Intravenous Stopped 03/30/16 2300)  aspirin chewable tablet 324 mg (324 mg Oral Given 03/30/16 2328)     Initial Impression / Assessment and Plan / ED Course  I have reviewed the  triage vital signs and the nursing notes.  Pertinent labs & imaging results that were available during my care of the patient were reviewed by me and considered in my medical decision making (see chart for details).  Clinical Course   81 year old female with history of smoking, prior DM/htn/hlpd presents with concern of chest pain.  Differential diagnosis for chest pain includes pulmonary embolus, dissection, pneumothorax, pneumonia, ACS, myocarditis, pericarditis.  EKG was done and evaluate by me and showed no acute ST changes and no signs of pericarditis. Chest x-ray was done and evaluated by me and radiology and showed no sign of pneumonia or pneumothorax. Patient has no dyspnea, no pleuritic pain and have low suspicion for PE.  Patient is high risk HEART score. Will admit for continued observation and evaluation.   Final Clinical Impressions(s) / ED Diagnoses   Final diagnoses:  Other chest pain    New Prescriptions Current Discharge Medication List       Gareth Morgan, MD 03/31/16 1428

## 2016-03-31 ENCOUNTER — Observation Stay (HOSPITAL_BASED_OUTPATIENT_CLINIC_OR_DEPARTMENT_OTHER): Payer: Medicare Other

## 2016-03-31 ENCOUNTER — Encounter (HOSPITAL_COMMUNITY): Payer: Self-pay

## 2016-03-31 ENCOUNTER — Ambulatory Visit: Payer: Medicare Other

## 2016-03-31 DIAGNOSIS — R0789 Other chest pain: Secondary | ICD-10-CM

## 2016-03-31 DIAGNOSIS — R079 Chest pain, unspecified: Secondary | ICD-10-CM

## 2016-03-31 LAB — TROPONIN I
TROPONIN I: 0.04 ng/mL — AB (ref <0.03)
Troponin I: <0.03 ng/mL (ref <0.03)
Troponin I: <0.03 ng/mL (ref <0.03)

## 2016-03-31 LAB — GLUCOSE, CAPILLARY
GLUCOSE-CAPILLARY: 89 mg/dL (ref 65–99)
GLUCOSE-CAPILLARY: 180 mg/dL — AB (ref 65–99)
Glucose-Capillary: 165 mg/dL — ABNORMAL HIGH (ref 65–99)
Glucose-Capillary: 103 mg/dL — ABNORMAL HIGH (ref 65–99)

## 2016-03-31 LAB — ECHOCARDIOGRAM COMPLETE
Height: 60 in
WEIGHTICAEL: 1536 [oz_av]

## 2016-03-31 LAB — D-DIMER, QUANTITATIVE (NOT AT ARMC)

## 2016-03-31 MED ORDER — FLUTICASONE FUROATE-VILANTEROL 100-25 MCG/INH IN AEPB
1.0000 | INHALATION_SPRAY | Freq: Every day | RESPIRATORY_TRACT | Status: DC
  Administered 2016-03-31 – 2016-04-01 (×2): 1 via RESPIRATORY_TRACT
  Filled 2016-03-31: qty 28

## 2016-03-31 MED ORDER — NITROGLYCERIN 0.4 MG SL SUBL: 0.4000 mg | SUBLINGUAL_TABLET | SUBLINGUAL | Status: DC | PRN

## 2016-03-31 MED ORDER — INSULIN ASPART 100 UNIT/ML ~~LOC~~ SOLN
0.0000 [IU] | Freq: Three times a day (TID) | SUBCUTANEOUS | Status: DC
  Administered 2016-03-31: 1 [IU] via SUBCUTANEOUS

## 2016-03-31 MED ORDER — ACETAMINOPHEN 325 MG PO TABS
650.0000 mg | ORAL_TABLET | ORAL | Status: DC | PRN
  Administered 2016-03-31: 650 mg via ORAL
  Filled 2016-03-31: qty 2

## 2016-03-31 MED ORDER — LEVOTHYROXINE SODIUM 50 MCG PO TABS
50.0000 ug | ORAL_TABLET | Freq: Every day | ORAL | Status: DC
  Administered 2016-03-31 – 2016-04-01 (×2): 50 ug via ORAL
  Filled 2016-03-31 (×2): qty 1

## 2016-03-31 MED ORDER — ONDANSETRON HCL 4 MG/2ML IJ SOLN: 4.0000 mg | Freq: Four times a day (QID) | INTRAMUSCULAR | Status: DC | PRN

## 2016-03-31 MED ORDER — HYDRALAZINE HCL 20 MG/ML IJ SOLN
10.0000 mg | INTRAMUSCULAR | Status: DC | PRN
  Administered 2016-03-31: 10 mg via INTRAVENOUS
  Filled 2016-03-31: qty 1

## 2016-03-31 MED ORDER — GABAPENTIN 100 MG PO CAPS
100.0000 mg | ORAL_CAPSULE | Freq: Two times a day (BID) | ORAL | Status: DC
  Administered 2016-03-31 – 2016-04-01 (×4): 100 mg via ORAL
  Filled 2016-03-31 (×4): qty 1

## 2016-03-31 MED ORDER — ALPRAZOLAM 0.5 MG PO TABS
0.5000 mg | ORAL_TABLET | Freq: Three times a day (TID) | ORAL | Status: DC | PRN
  Administered 2016-03-31 (×2): 0.5 mg via ORAL
  Filled 2016-03-31 (×2): qty 1

## 2016-03-31 MED ORDER — ADULT MULTIVITAMIN W/MINERALS CH
1.0000 | ORAL_TABLET | Freq: Every day | ORAL | Status: DC
  Administered 2016-03-31 – 2016-04-01 (×2): 1 via ORAL
  Filled 2016-03-31 (×2): qty 1

## 2016-03-31 MED ORDER — LIDOCAINE 5 % EX PTCH
1.0000 | MEDICATED_PATCH | Freq: Every day | CUTANEOUS | Status: DC | PRN
  Filled 2016-03-31: qty 1

## 2016-03-31 NOTE — Progress Notes (Signed)
Patient ID: Laura [name], female   DOB: Jun 07, 1935, 81 y.o.   MRN: [id number] Patient admitted after midnight. Please refer to admission note done 03/31/2016 for details.  82 y.o. female with past medical history of colon cancer and breast cancer in remission, chronic GI bleed with AVMs of the small intestine requiring IV iron therapy last one was 2 weeks prior to this admission, further history of hypertension, dyslipidemia but apparently taken off of all medications about 2-3 months ago. Patient presented to [facility] with worsening retrosternal pressure-like chest pain radiating to left arm. Patient reported pain to be at rest. No specific aggravating factors. Patient did not take any analgesics and pain eventually subsided on its own.  Patient was hemodynamically stable in ED. The 12-lead EKG showed nonspecific findings. The troponin level was negative and chest x-ray show no acute cardiopulmonary findings.  Assessment and plan:  Chest pain - The troponin level was negative, total of 3 sets - The 12-lead EKG showed no acute ischemic changes - We will obtain 2-D echo - Start regular diet - If she feels better she can potentially go home later on today  Leisa Lenz [place]

## 2016-03-31 NOTE — Progress Notes (Signed)
CRITICAL VALUE ALERT  Critical value received:  Troponin 0.04  Date of notification:  03/31/16  Time of notification:  [id number]  Critical value read back:Yes.    Nurse who received alert:  Catie Conley [place]  MD notified (1st page):  Dr. Charlies Silvers   Time of first page:  1627  MD notified (2nd page):  Time of second page:  Responding MD:  Dr. Charlies Silvers  Time MD responded:  1630

## 2016-03-31 NOTE — H&P (Signed)
History and Physical    Laura [name] [id number] DOB: Nov 20, 1934 DOA: 03/30/2016  PCP: [name], MD  Patient coming from: Home.  Chief Complaint: Chest pain.  HPI: Laura [name] is a 81 y.o. female with history of colon cancer and breast cancer in remission, chronic GI bleed with AVMs of the small intestine requiring IV iron therapy last one was 2 weeks ago and previous history of hypertension and diabetes mellitus and hyperlipidemia off medications now presents to the ER because of chest pain. Patient has been experiencing chest pain since last evening around 3 PM while sitting on the chair and watching TV. Pain is retrosternal pressure-like eventually radiating to the left arm. Patient tried taking some Pepto-Bismol despite which her pain did not improve. Patient came to the ER by then patient's chest pain resolved. Patient states she may have had chest pain at least for 5 hours. Was associated with mild shortness of breath. Denies any productive cough fever chills or abdominal pain. In the ER EKG was showing nonspecific findings, troponin was negative and chest x-ray was negative. Given the patient's risk factors patient is being admitted for further observation.   ED Course: See history of presenting illness.  Review of Systems: As per HPI, rest all negative.   Past Medical History:  Diagnosis Date  . Acute left hemiparesis ([place]) 03/07/2014  . Anemia   . Bilateral hearing loss 11/15/2014  . Breast cancer ([place]) 2001   bilatera cancer; bilateral mastectomy; no chemo; 5 years of adjuvant hormonal therapy.  Was at [facility] [place].   . Cancer of colon ([place]) 1999   UVA again, not sure stage I or II, no chemo recommended.   . Chronic obstructive bronchitis ([place])   . Depression   . Diverticulosis   . Hx of colonic polyps   . Hx of vaginal bleeding   . Hypercholesteremia   . Hypertension   . Hypothyroidism   . Idiopathic peripheral neuropathy   . Iron deficiency anemia  secondary to blood loss (chronic) 11/17/2012  . Iron deficiency anemia secondary to blood loss (chronic) 11/17/2012  . Muscle weakness (generalized)   . Myofacial muscle pain   . Renal colic   . Tubular adenoma of colon   . Type II diabetes mellitus (HCC)    neuropathy    Past Surgical History:  Procedure Laterality Date  . COLON SURGERY    . ENTEROSCOPY N/A 11/25/2012   Procedure: ENTEROSCOPY;  Surgeon: Jerene Bears, MD;  Location: [facility];  Service: Gastroenterology;  Laterality: N/A;  with apc  . ENTEROSCOPY N/A 05/22/2015   Procedure: ENTEROSCOPY;  Surgeon: Jerene Bears, MD;  Location: [facility];  Service: Gastroenterology;  Laterality: N/A;  . HERNIA REPAIR     umblical hernia  . MASTECTOMY     double  . PARTIAL COLECTOMY    . TONSILLECTOMY       reports that she has been smoking Cigarettes.  She has a 25.00 pack-year smoking history. She has never used smokeless tobacco. She reports that she does not drink alcohol or use drugs.  Allergies  Allergen Reactions  . Diclofenac Nausea And Vomiting    Family History  Problem Relation Age of Onset  . Lung cancer Brother     brother 1  . Lung cancer Sister     died from  . Colon cancer Mother 41  . Breast cancer Maternal Aunt   . Breast cancer Daughter   . Lung cancer Brother  brother 2    Prior to Admission medications   Medication Sig Start Date End Date Taking? Authorizing Provider  acetaminophen (TYLENOL) 500 MG tablet Take 500 mg by mouth every 6 (six) hours as needed for moderate pain.   Yes Historical Provider, MD  ALPRAZolam Duanne Moron) 0.5 MG tablet Take one tablet by mouth three times daily as needed for sleep/anxiety 10/08/15  Yes Historical Provider, MD  BREO ELLIPTA 100-25 MCG/INH AEPB INL 1 PUFF PO D 10/02/15  Yes Historical Provider, MD  gabapentin (NEURONTIN) 100 MG capsule Take 100 mg by mouth 2 (two) times daily.   Yes Historical Provider, MD  levothyroxine (SYNTHROID, LEVOTHROID) 50 MCG tablet Take  50 mcg by mouth daily.     Yes Historical Provider, MD  lidocaine (LIDODERM) 5 % Place 1 patch onto the skin daily as needed (pain). Legs or back. 01/23/14  Yes Historical Provider, MD  Multiple Vitamin (MULTIVITAMIN WITH MINERALS) TABS tablet Take 1 tablet by mouth daily.   Yes Historical Provider, MD  Polyethyl Glycol-Propyl Glycol (SYSTANE OP) Place 1 drop into both eyes 2 (two) times daily as needed (dry eyes).   Yes Historical Provider, MD    Physical Exam: Vitals:   03/30/16 2308 03/30/16 2330 03/31/16 0000 03/31/16 0030  BP:  155/65 148/58 (!) 170/78  Pulse: 63 64 60 63  Resp: 23 20 21 20   Temp:    98.2 F (36.8 C)  TempSrc:    Oral  SpO2: 97% 98% 97% 100%  Weight:      Height:          Constitutional: Moderately built and nourished. Vitals:   03/30/16 2308 03/30/16 2330 03/31/16 0000 03/31/16 0030  BP:  155/65 148/58 (!) 170/78  Pulse: 63 64 60 63  Resp: 23 20 21 20   Temp:    98.2 F (36.8 C)  TempSrc:    Oral  SpO2: 97% 98% 97% 100%  Weight:      Height:       Eyes: Anicteric no pallor. ENMT: No discharge from the ears eyes nose mouth. Neck: No JVD appreciated no mass felt. Respiratory: No rhonchi or crepitations. Cardiovascular: S1-S2 heard. No murmur appreciated. Abdomen: Soft nontender bowel sounds present. Musculoskeletal: No edema. No joint effusion. Skin: No rash. Skin is warm to touch. Neurologic: Right facial droop from previous Bell's palsy. Moves all extremities 5 x 5. Psychiatric: Alert awake oriented to time place and person. Normal affect.   Labs on Admission: I have personally reviewed following labs and imaging studies  CBC:  Recent Labs Lab 03/30/16 2158  WBC 5.7  HGB 11.7*  HCT 36.4  MCV 97.1  PLT [id number]   Basic Metabolic Panel:  Recent Labs Lab 03/30/16 2158  NA 140  K 4.2  CL 106  CO2 29  GLUCOSE 98  BUN 11  CREATININE 0.57  CALCIUM 10.0   GFR: Estimated Creatinine Clearance: 39.8 mL/min (by C-G formula based on SCr of  0.57 mg/dL). Liver Function Tests: No results for input(s): AST, ALT, ALKPHOS, BILITOT, PROT, ALBUMIN in the last 168 hours. No results for input(s): LIPASE, AMYLASE in the last 168 hours. No results for input(s): AMMONIA in the last 168 hours. Coagulation Profile: No results for input(s): INR, PROTIME in the last 168 hours. Cardiac Enzymes: No results for input(s): CKTOTAL, CKMB, CKMBINDEX, TROPONINI in the last 168 hours. BNP (last 3 results) No results for input(s): PROBNP in the last 8760 hours. HbA1C: No results for input(s): HGBA1C in the last 72  hours. CBG: No results for input(s): GLUCAP in the last 168 hours. Lipid Profile: No results for input(s): CHOL, HDL, LDLCALC, TRIG, CHOLHDL, LDLDIRECT in the last 72 hours. Thyroid Function Tests: No results for input(s): TSH, T4TOTAL, FREET4, T3FREE, THYROIDAB in the last 72 hours. Anemia Panel: No results for input(s): VITAMINB12, FOLATE, FERRITIN, TIBC, IRON, RETICCTPCT in the last 72 hours. Urine analysis:    Component Value Date/Time   COLORURINE YELLOW 11/28/2012 [place] 11/28/2012 1206   LABSPEC 1.008 11/28/2012 1206   PHURINE 6.0 11/28/2012 1206   GLUCOSEU NEGATIVE 11/28/2012 1206   HGBUR NEGATIVE 11/28/2012 1206   BILIRUBINUR NEGATIVE 11/28/2012 1206   KETONESUR NEGATIVE 11/28/2012 1206   PROTEINUR NEGATIVE 11/28/2012 1206   UROBILINOGEN 0.2 11/28/2012 1206   NITRITE NEGATIVE 11/28/2012 1206   LEUKOCYTESUR LARGE (A) 11/28/2012 1206   Sepsis Labs: @LABRCNTIP (procalcitonin:4,lacticidven:4) )No results found for this or any previous visit (from the past 240 hour(s)).   Radiological Exams on Admission: Dg Chest 2 View  Result Date: 03/30/2016 CLINICAL DATA:  Chest pain for 4 hours radiating to the left arm. EXAM: CHEST  2 VIEW COMPARISON:  CT chest 12/09/2013 FINDINGS: Emphysematous changes in the lungs. Normal heart size and pulmonary vascularity. No focal airspace disease or consolidation in the  lungs. No blunting of costophrenic angles. No pneumothorax. Mediastinal contours appear intact. Calcification of the aorta. IMPRESSION: Emphysematous changes in the lungs. No evidence of active pulmonary disease. Electronically Signed   By: Lucienne Capers M.D.   On: 03/30/2016 22:22    EKG: Independently reviewed. Normal sinus rhythm with nonspecific ST changes in the lead V1 and V2.  Assessment/Plan Principal Problem:   Chest pain Active Problems:   History of colon cancer   History of breast cancer   TOBACCO ABUSE   History of arteriovenous malformation (AVM)   Essential hypertension    1. Chest pain - given the risk factors including elevated blood pressure age and tobacco abuse we will cycle cardiac markers check 2-D echo and keep patient nothing by mouth in a.m. in anticipation of possible cardiac procedures. Consult cardiology in a.m. Patient did receive aspirin in the ER. But since patient has recurrent GI bleed with AVMs I have not placed on further aspirin dose until patient rules in for MI since patient has high risk for GI bleed. Check d-dimer. 2. Elevated blood pressure - patient states until 2 months ago patient used to be on antihypertensives which were discontinued. At this time I have placed patient on when necessary IV hydralazine and will closely follow blood pressure trends. 3. Chronic anemia with chronic GI bleed with AVMs requiring IV iron therapy. 4. Tobacco abuse - tobacco cessation counseling requested. 5. History of diabetes mellitus and hyperlipidemia off medications. I have placed patient on sliding scale insulin, closely follow CBGs. 6. History of colon cancer and breast cancer in remission.   DVT prophylaxis: SCDs. Code Status: Full code.  Family Communication: Discussed with patient.  Disposition Plan: Home.  Consults called: None.  Admission status: Observation.    Rise Patience MD [facility] Pager [phone].  If 7PM-7AM, please  contact night-coverage [email or web address] Password [email or web address]  03/31/2016, 1:08 AM

## 2016-03-31 NOTE — Progress Notes (Signed)
Nutrition Brief Note  Patient identified on the Malnutrition Screening Tool (MST) Report Patient with stable weight.   Wt Readings from Last 15 Encounters:  03/30/16 96 lb (43.5 kg)  03/04/16 86 lb 12.8 oz (39.4 kg)  11/16/15 91 lb 3.2 oz (41.4 kg)  10/25/15 98 lb (44.5 kg)  10/17/15 98 lb (44.5 kg)  06/05/15 95 lb 3.2 oz (43.2 kg)  05/22/15 94 lb (42.6 kg)  05/18/15 94 lb 3.2 oz (42.7 kg)  04/19/15 95 lb 6 oz (43.3 kg)  04/16/15 96 lb 11.2 oz (43.9 kg)  04/06/15 100 lb 4.8 oz (45.5 kg)  11/15/14 100 lb 4.8 oz (45.5 kg)  07/12/14 100 lb (45.4 kg)  06/16/14 100 lb (45.4 kg)  04/17/14 101 lb (45.8 kg)    Body mass index is 18.75 kg/m. Patient meets criteria for normal range based on current BMI.   Current diet order is regular.  Labs and medications reviewed.   No nutrition interventions warranted at this time. If nutrition issues arise, please consult RD.   Clayton Bibles, MS, RD, LDN Pager: [phone] After Hours Pager: [phone]

## 2016-04-01 DIAGNOSIS — R0789 Other chest pain: Secondary | ICD-10-CM | POA: Diagnosis not present

## 2016-04-01 LAB — GLUCOSE, CAPILLARY: GLUCOSE-CAPILLARY: 88 mg/dL (ref 65–99)

## 2016-04-01 NOTE — Discharge Summary (Signed)
Physician Discharge Summary  Laura [name] [id number] DOB: [birth year] DOA: 03/30/2016  PCP: [name], MD  Admit date: 03/30/2016 Discharge date: 04/01/2016  Recommendations for Outpatient Follow-up:  1. No changes in medications on discharge.   Discharge Diagnoses:  Principal Problem:   Chest pain Active Problems:   History of colon cancer   History of breast cancer   TOBACCO ABUSE   History of arteriovenous malformation (AVM)   Essential hypertension    Discharge Condition: stable   Diet recommendation: as tolerated   History of present illness:  81 y.o.femalewith past medical history of colon cancer and breast cancer in remission, chronic GI bleed with AVMs of the small intestine requiring IV iron therapy last onewas 2 weeks prior to this admission, further history of hypertension, dyslipidemia but apparently taken off of all medications about 2-3 months ago. Patient presented to [facility] long hospital with worsening retrosternal pressure-like chest pain radiating to left arm. Patient reported pain to be at rest. No specific aggravating factors. Patient did not take any analgesics and pain eventually subsided on its own.  Patient was hemodynamically stable in ED. The 12-lead EKG showed nonspecific findings. The troponin level was negative and chest x-ray show no acute cardiopulmonary findings.  Hospital Course:   Assessment and plan:  Chest pain - The troponin level was negative, total of 3 sets - The 12-lead EKG showed no acute ischemic changes - 2 D ECHO with normal EF     Signed:  Leisa Lenz, MD  [facility] 04/01/2016, 12:17 PM  Pager #: [phone]  Time spent in minutes: less than 30 minutes    Discharge Exam: Vitals:   04/01/16 0438 04/01/16 1049  BP: (!) 115/45 124/65  Pulse: 65 61  Resp: 18 18  Temp: 98.1 F (36.7 C) 98.5 F (36.9 C)   Vitals:   04/01/16 0211 04/01/16 0438 04/01/16 0848 04/01/16 1049  BP: (!) 124/42 (!)  115/45  124/65  Pulse: 80 65  61  Resp: 18 18  18   Temp: 98.5 F (36.9 C) 98.1 F (36.7 C)  98.5 F (36.9 C)  TempSrc: Oral Oral  Oral  SpO2: 96% 97% 93% 99%  Weight:      Height:        General: Pt is alert, follows commands appropriately, not in acute distress Cardiovascular: Regular rate and rhythm, S1/S2 + Respiratory: Clear to auscultation bilaterally, no wheezing, no crackles, no rhonchi Abdominal: Soft, non tender, non distended, bowel sounds +, no guarding Extremities: no edema, no cyanosis, pulses palpable bilaterally DP and PT Neuro: Grossly nonfocal  Discharge Instructions  Discharge Instructions    Call MD for:  persistant nausea and vomiting    Complete by:  As directed    Call MD for:  redness, tenderness, or signs of infection (pain, swelling, redness, odor or green/yellow discharge around incision site)    Complete by:  As directed    Call MD for:  severe uncontrolled pain    Complete by:  As directed    Diet - low sodium heart healthy    Complete by:  As directed    Increase activity slowly    Complete by:  As directed        Medication List    TAKE these medications   acetaminophen 500 MG tablet Commonly known as:  TYLENOL Take 500 mg by mouth every 6 (six) hours as needed for moderate pain.   ALPRAZolam 0.5 MG tablet Commonly known as:  XANAX Take one  tablet by mouth three times daily as needed for sleep/anxiety   BREO ELLIPTA 100-25 MCG/INH Aepb Generic drug:  fluticasone furoate-vilanterol INL 1 PUFF PO D   gabapentin 100 MG capsule Commonly known as:  NEURONTIN Take 100 mg by mouth 2 (two) times daily.   levothyroxine 50 MCG tablet Commonly known as:  SYNTHROID, LEVOTHROID Take 50 mcg by mouth daily.   lidocaine 5 % Commonly known as:  LIDODERM Place 1 patch onto the skin daily as needed (pain). Legs or back.   multivitamin with minerals Tabs tablet Take 1 tablet by mouth daily.   SYSTANE OP Place 1 drop into both eyes 2 (two)  times daily as needed (dry eyes).      Follow-up Information    HOLWERDA, Nicki Reaper, MD. Schedule an appointment as soon as possible for a visit in 1 week(s).   Specialty:  Internal Medicine Contact information: [address] [place] [address] [phone]            The results of significant diagnostics from this hospitalization (including imaging, microbiology, ancillary and laboratory) are listed below for reference.    Significant Diagnostic Studies: Dg Chest 2 View  Result Date: 03/30/2016 CLINICAL DATA:  Chest pain for 4 hours radiating to the left arm. EXAM: CHEST  2 VIEW COMPARISON:  CT chest 12/09/2013 FINDINGS: Emphysematous changes in the lungs. Normal heart size and pulmonary vascularity. No focal airspace disease or consolidation in the lungs. No blunting of costophrenic angles. No pneumothorax. Mediastinal contours appear intact. Calcification of the aorta. IMPRESSION: Emphysematous changes in the lungs. No evidence of active pulmonary disease. Electronically Signed   By: Lucienne Capers M.D.   On: 03/30/2016 22:22    Microbiology: No results found for this or any previous visit (from the past 240 hour(s)).   Labs: Basic Metabolic Panel:  Recent Labs Lab 03/30/16 2158  NA 140  K 4.2  CL 106  CO2 29  GLUCOSE 98  BUN 11  CREATININE 0.57  CALCIUM 10.0   Liver Function Tests: No results for input(s): AST, ALT, ALKPHOS, BILITOT, PROT, ALBUMIN in the last 168 hours. No results for input(s): LIPASE, AMYLASE in the last 168 hours. No results for input(s): AMMONIA in the last 168 hours. CBC:  Recent Labs Lab 03/30/16 2158  WBC 5.7  HGB 11.7*  HCT 36.4  MCV 97.1  PLT 290   Cardiac Enzymes:  Recent Labs Lab 03/31/16 0121 03/31/16 0701 03/31/16 1337  TROPONINI <0.03 <0.03 0.04*   BNP: BNP (last 3 results) No results for input(s): BNP in the last 8760 hours.  ProBNP (last 3 results) No results for input(s): PROBNP in the last 8760  hours.  CBG:  Recent Labs Lab 03/31/16 0800 03/31/16 1221 03/31/16 1656 03/31/16 2207 04/01/16 0748  GLUCAP 89 165* 103* 180* 88

## 2016-04-01 NOTE — Discharge Instructions (Signed)

## 2016-04-21 ENCOUNTER — Ambulatory Visit (HOSPITAL_BASED_OUTPATIENT_CLINIC_OR_DEPARTMENT_OTHER): Payer: Medicare Other

## 2016-04-21 VITALS — BP 107/79 | HR 80 | Temp 98.2°F | Resp 18

## 2016-04-21 DIAGNOSIS — D500 Iron deficiency anemia secondary to blood loss (chronic): Secondary | ICD-10-CM

## 2016-04-21 DIAGNOSIS — K31811 Angiodysplasia of stomach and duodenum with bleeding: Secondary | ICD-10-CM

## 2016-04-21 MED ORDER — SODIUM CHLORIDE 0.9 % IV SOLN
510.0000 mg | Freq: Once | INTRAVENOUS | Status: AC
  Administered 2016-04-21: 510 mg via INTRAVENOUS
  Filled 2016-04-21: qty 17

## 2016-04-21 MED ORDER — SODIUM CHLORIDE 0.9 % IV SOLN
Freq: Once | INTRAVENOUS | Status: AC
  Administered 2016-04-21: 12:00:00 via INTRAVENOUS

## 2016-04-21 NOTE — Patient Instructions (Signed)

## 2016-05-13 DIAGNOSIS — E784 Other hyperlipidemia: Secondary | ICD-10-CM | POA: Diagnosis not present

## 2016-05-13 DIAGNOSIS — I10 Essential (primary) hypertension: Secondary | ICD-10-CM | POA: Diagnosis not present

## 2016-05-13 DIAGNOSIS — R29810 Facial weakness: Secondary | ICD-10-CM | POA: Diagnosis not present

## 2016-05-13 DIAGNOSIS — Z681 Body mass index (BMI) 19 or less, adult: Secondary | ICD-10-CM | POA: Diagnosis not present

## 2016-05-13 DIAGNOSIS — G510 Bell's palsy: Secondary | ICD-10-CM | POA: Diagnosis not present

## 2016-05-13 DIAGNOSIS — E119 Type 2 diabetes mellitus without complications: Secondary | ICD-10-CM | POA: Diagnosis not present

## 2016-05-15 ENCOUNTER — Other Ambulatory Visit: Payer: Self-pay | Admitting: Internal Medicine

## 2016-05-15 DIAGNOSIS — R29810 Facial weakness: Secondary | ICD-10-CM

## 2016-05-20 ENCOUNTER — Ambulatory Visit
Admission: RE | Admit: 2016-05-20 | Discharge: 2016-05-20 | Disposition: A | Discharge status: Home / Self Care | Payer: Medicare Other | Source: Ambulatory Visit | Attending: Internal Medicine | Admitting: Internal Medicine

## 2016-05-20 DIAGNOSIS — R29810 Facial weakness: Secondary | ICD-10-CM

## 2016-05-20 DIAGNOSIS — R220 Localized swelling, mass and lump, head: Secondary | ICD-10-CM | POA: Diagnosis not present

## 2016-05-20 DIAGNOSIS — K118 Other diseases of salivary glands: Secondary | ICD-10-CM | POA: Diagnosis not present

## 2016-05-20 MED ORDER — IOPAMIDOL (ISOVUE-300) INJECTION 61%
80.0000 mL | Freq: Once | INTRAVENOUS | Status: AC | PRN
  Administered 2016-05-20: 80 mL via INTRAVENOUS

## 2016-05-21 ENCOUNTER — Other Ambulatory Visit: Payer: Self-pay | Admitting: Internal Medicine

## 2016-05-21 DIAGNOSIS — I6523 Occlusion and stenosis of bilateral carotid arteries: Secondary | ICD-10-CM

## 2016-05-27 ENCOUNTER — Other Ambulatory Visit (INDEPENDENT_AMBULATORY_CARE_PROVIDER_SITE_OTHER): Payer: Self-pay | Admitting: Otolaryngology

## 2016-05-27 DIAGNOSIS — D490 Neoplasm of unspecified behavior of digestive system: Secondary | ICD-10-CM | POA: Diagnosis not present

## 2016-05-27 DIAGNOSIS — D37030 Neoplasm of uncertain behavior of the parotid salivary glands: Secondary | ICD-10-CM | POA: Diagnosis not present

## 2016-05-27 DIAGNOSIS — G519 Disorder of facial nerve, unspecified: Secondary | ICD-10-CM | POA: Diagnosis not present

## 2016-06-10 DIAGNOSIS — D37030 Neoplasm of uncertain behavior of the parotid salivary glands: Secondary | ICD-10-CM | POA: Diagnosis not present

## 2016-06-10 DIAGNOSIS — G519 Disorder of facial nerve, unspecified: Secondary | ICD-10-CM | POA: Diagnosis not present

## 2016-08-12 DIAGNOSIS — D37030 Neoplasm of uncertain behavior of the parotid salivary glands: Secondary | ICD-10-CM | POA: Diagnosis not present

## 2016-08-12 DIAGNOSIS — G519 Disorder of facial nerve, unspecified: Secondary | ICD-10-CM | POA: Diagnosis not present

## 2016-08-22 ENCOUNTER — Other Ambulatory Visit (HOSPITAL_BASED_OUTPATIENT_CLINIC_OR_DEPARTMENT_OTHER): Payer: Medicare Other

## 2016-08-22 DIAGNOSIS — D500 Iron deficiency anemia secondary to blood loss (chronic): Secondary | ICD-10-CM

## 2016-08-22 LAB — CBC & DIFF AND RETIC
BASO%: 0.2 % (ref 0.0–2.0)
Basophils Absolute: 0 10*3/uL (ref 0.0–0.1)
EOS%: 1.1 % (ref 0.0–7.0)
Eosinophils Absolute: 0.1 10*3/uL (ref 0.0–0.5)
HCT: 43.1 % (ref 34.8–46.6)
HGB: 14.2 g/dL (ref 11.6–15.9)
Immature Retic Fract: 2.9 % (ref 1.60–10.00)
LYMPH%: 16.8 % (ref 14.0–49.7)
MCH: 32.6 pg (ref 25.1–34.0)
MCHC: 32.9 g/dL (ref 31.5–36.0)
MCV: 99.1 fL (ref 79.5–101.0)
MONO#: 0.4 10*3/uL (ref 0.1–0.9)
MONO%: 6.8 % (ref 0.0–14.0)
NEUT%: 75.1 % (ref 38.4–76.8)
NEUTROS ABS: 4.9 10*3/uL (ref 1.5–6.5)
PLATELETS: 275 10*3/uL (ref 145–400)
RBC: 4.35 10*6/uL (ref 3.70–5.45)
RDW: 13.3 % (ref 11.2–14.5)
RETIC %: 0.74 % (ref 0.70–2.10)
RETIC CT ABS: 32.19 10*3/uL — AB (ref 33.70–90.70)
WBC: 6.5 10*3/uL (ref 3.9–10.3)
lymph#: 1.1 10*3/uL (ref 0.9–3.3)

## 2016-08-22 LAB — FERRITIN: FERRITIN: 40 ng/mL (ref 9–269)

## 2016-08-29 ENCOUNTER — Telehealth: Payer: Self-pay

## 2016-08-29 ENCOUNTER — Encounter: Payer: Self-pay | Admitting: Hematology and Oncology

## 2016-08-29 ENCOUNTER — Telehealth: Payer: Self-pay | Admitting: Hematology and Oncology

## 2016-08-29 ENCOUNTER — Ambulatory Visit: Payer: Medicare Other

## 2016-08-29 ENCOUNTER — Ambulatory Visit (HOSPITAL_BASED_OUTPATIENT_CLINIC_OR_DEPARTMENT_OTHER): Payer: Medicare Other | Admitting: Hematology and Oncology

## 2016-08-29 DIAGNOSIS — D500 Iron deficiency anemia secondary to blood loss (chronic): Secondary | ICD-10-CM

## 2016-08-29 DIAGNOSIS — R64 Cachexia: Secondary | ICD-10-CM

## 2016-08-29 DIAGNOSIS — Z7189 Other specified counseling: Secondary | ICD-10-CM | POA: Diagnosis not present

## 2016-08-29 DIAGNOSIS — C07 Malignant neoplasm of parotid gland: Secondary | ICD-10-CM | POA: Diagnosis not present

## 2016-08-29 NOTE — Assessment & Plan Note (Signed)
The patient has locally advanced parotid cancer, biopsy-proven. The patient had bilateral prior cancer diagnosis. She is very realistic. She is cachectic and malnourished but shared with me her clear understanding of the prognosis of untreated parotid cancer. Currently, she is not in pain. We discussed treatment options including surgery, possibly radiation treatment or chemotherapy.  Ultimately, the patient has made informed decision to get enrolled in palliative care/hospice program. With her permission, I will consult home-based palliative care/hospice program.

## 2016-08-29 NOTE — Assessment & Plan Note (Signed)
Her iron deficiency anemia has resolved with multiple intravenous iron replacement therapy.  She does not need further follow-up in this regard

## 2016-08-29 NOTE — Telephone Encounter (Signed)
Called with referral to [facility]: [phone]; terminal illness from parotic cancer.  Please ask if their medical director can do symptoms management  Patient's best contact # is [phone].  Above information given to Manus Gunning at [facility].

## 2016-08-29 NOTE — Progress Notes (Signed)
[place] NOTE  Laura Hatchet, MD SUMMARY OF HEMATOLOGIC HISTORY: This is a pleasant lady with background history of chronic GI bleed. She had remote history of colon cancer diagnosed in 1999 status post resection. She also had remote history of breast cancer diagnosed in 2000, neither records were available. She had EGD and colonoscopy in 2014 which showed angioectasias and multiple benign polyps. In May of 2014, November 2014, March 2015, November 2015, and October 2016 she received intravenous iron infusion with improvement of her hemoglobin. She received further doses on April, September and October 2017 INTERVAL HISTORY: Laura [name] 82 y.o. female returns for follow-up on iron deficiency anemia.  As I reviewed her chart, it was also noted that she had recent evaluation around November 2017 for parotid mass. Biopsy from the parotid mass came back positive for malignancy. I reviewed information from her ENT surgeon from recent visit a month ago. Apparently, the patient has elected for observation. She denies pain.  She has very mild swallowing problems and mild dysphagia. She denies recent choking. She had anorexia and had progressive weight loss. The mass has grown to the point that she had very minimal neck discomfort and it has started to affect her right eye vision.  She denies hearing deficit in the same site of her face.  I have reviewed the past medical history, past surgical history, social history and family history with the patient and they are unchanged from previous note.  ALLERGIES:  is allergic to diclofenac.  MEDICATIONS:  Current Outpatient Prescriptions  Medication Sig Dispense Refill  . acetaminophen (TYLENOL) 500 MG tablet Take 500 mg by mouth every 6 (six) hours as needed for moderate pain.    Marland Kitchen ALPRAZolam (XANAX) 0.5 MG tablet Take one tablet by mouth three times daily as needed for sleep/anxiety  0  . BREO ELLIPTA 100-25 MCG/INH  AEPB INL 1 PUFF PO D  3  . gabapentin (NEURONTIN) 100 MG capsule Take 100 mg by mouth 2 (two) times daily.    Marland Kitchen levothyroxine (SYNTHROID, LEVOTHROID) 50 MCG tablet Take 50 mcg by mouth daily.      Marland Kitchen lidocaine (LIDODERM) 5 % Place 1 patch onto the skin daily as needed (pain). Legs or back.    . Multiple Vitamin (MULTIVITAMIN WITH MINERALS) TABS tablet Take 1 tablet by mouth daily.    Vladimir Faster Glycol-Propyl Glycol (SYSTANE OP) Place 1 drop into both eyes 2 (two) times daily as needed (dry eyes).     No current facility-administered medications for this visit.      REVIEW OF SYSTEMS:   Constitutional: Denies fevers, chills or night sweats Eyes: Denies blurriness of vision Ears, nose, mouth, throat, and face: Denies mucositis or sore throat Respiratory: Denies cough, dyspnea or wheezes Cardiovascular: Denies palpitation, chest discomfort or lower extremity swelling Gastrointestinal:  Denies nausea, heartburn or change in bowel habits Skin: Denies abnormal skin rashes Lymphatics: Denies new lymphadenopathy or easy bruising Behavioral/Psych: Mood is stable, no new changes  All other systems were reviewed with the patient and are negative.  PHYSICAL EXAMINATION: ECOG PERFORMANCE STATUS: 2 - Symptomatic, <50% confined to bed GENERAL:alert, no distress and comfortable.  She looks thin and cachectic.  She has obvious facial deformity with right-sided facial paralysis SKIN: skin color, texture, turgor are normal, no rashes or significant lesions EYES: normal, Conjunctiva are pink and non-injected, sclera clear OROPHARYNX:no exudate, no erythema and lips, buccal mucosa, and tongue normal  NECK: She has a hard palpable  mass in the right parotid region on exam compatible with malignancy  LYMPH:  no palpable lymphadenopathy in the cervical, axillary or inguinal LUNGS: clear to auscultation and percussion with normal breathing effort HEART: regular rate & rhythm and no murmurs and no lower  extremity edema ABDOMEN:abdomen soft, non-tender and normal bowel sounds Musculoskeletal:no cyanosis of digits and no clubbing  NEURO: alert & oriented x 3 with speech disturbances due to right-sided facial paralysis.  I have reviewed imaging study with the patient and her daughter  LABORATORY DATA:  I have reviewed the data as listed     Component Value Date/Time   NA 140 03/30/2016 2158   NA 141 04/06/2015 1000   K 4.2 03/30/2016 2158   K 3.7 04/06/2015 1000   CL 106 03/30/2016 2158   CL 109 (H) 11/24/2012 0816   CO2 29 03/30/2016 2158   CO2 28 04/06/2015 1000   GLUCOSE 98 03/30/2016 2158   GLUCOSE 80 04/06/2015 1000   GLUCOSE 102 (H) 11/24/2012 0816   BUN 11 03/30/2016 2158   BUN 8.6 04/06/2015 1000   CREATININE 0.57 03/30/2016 2158   CREATININE 0.7 04/06/2015 1000   CALCIUM 10.0 03/30/2016 2158   CALCIUM 9.3 04/06/2015 1000   PROT 7.0 04/06/2015 1000   ALBUMIN 3.8 04/06/2015 1000   AST 16 04/06/2015 1000   ALT 9 04/06/2015 1000   ALKPHOS 96 04/06/2015 1000   BILITOT <0.30 04/06/2015 1000   GFRNONAA >60 03/30/2016 2158   GFRAA >60 03/30/2016 2158    No results found for: SPEP, UPEP  Lab Results  Component Value Date   WBC 6.5 08/22/2016   NEUTROABS 4.9 08/22/2016   HGB 14.2 08/22/2016   HCT 43.1 08/22/2016   MCV 99.1 08/22/2016   PLT 275 08/22/2016      Chemistry      Component Value Date/Time   NA 140 03/30/2016 2158   NA 141 04/06/2015 1000   K 4.2 03/30/2016 2158   K 3.7 04/06/2015 1000   CL 106 03/30/2016 2158   CL 109 (H) 11/24/2012 0816   CO2 29 03/30/2016 2158   CO2 28 04/06/2015 1000   BUN 11 03/30/2016 2158   BUN 8.6 04/06/2015 1000   CREATININE 0.57 03/30/2016 2158   CREATININE 0.7 04/06/2015 1000      Component Value Date/Time   CALCIUM 10.0 03/30/2016 2158   CALCIUM 9.3 04/06/2015 1000   ALKPHOS 96 04/06/2015 1000   AST 16 04/06/2015 1000   ALT 9 04/06/2015 1000   BILITOT <0.30 04/06/2015 1000       RADIOGRAPHIC STUDIES: I  have reviewed her last CT imaging study with the patient and the daughter I have personally reviewed the radiological images as listed and agreed with the findings in the report.   ASSESSMENT & PLAN:  Cancer of parotid gland [facility]) The patient has locally advanced parotid cancer, biopsy-proven. The patient had bilateral prior cancer diagnosis. She is very realistic. She is cachectic and malnourished but shared with me her clear understanding of the prognosis of untreated parotid cancer. Currently, she is not in pain. We discussed treatment options including surgery, possibly radiation treatment or chemotherapy.  Ultimately, the patient has made informed decision to get enrolled in palliative care/hospice program. With her permission, I will consult home-based palliative care/hospice program.  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 She has minimum dysphagia. Denies recent choking sensation. We discussed nutritional support.  She is currently on bland diet. The patient has  declined feeding tube placement.  Iron deficiency anemia due to chronic blood loss Her iron deficiency anemia has resolved with multiple intravenous iron replacement therapy.  She does not need further follow-up in this regard  Goals of care, counseling/discussion The patient is aware she has incurable disease and any treatment is strictly palliative. We discussed importance of Advanced Directives and Living will. The patient is interested to get enrolled in homebase palliative care/hospice. We would get her advanced directives done at that time. We discussed CODE STATUS; the patient desires to be DNR  She is not interested in any life-sustaining measures such as feeding tube placement of hemodialysis  She shared with me her Darrick Meigs faith.    All questions were answered. The patient knows to call the clinic with any problems, questions or concerns. No barriers to learning  was detected.  I spent 40 minutes counseling the patient face to face. The total time spent in the appointment was 55 minutes and more than 50% was on counseling.     Heath Lark, MD 3/2/20188:52 AM

## 2016-08-29 NOTE — Telephone Encounter (Signed)
Return for No new orders.

## 2016-08-29 NOTE — Assessment & Plan Note (Signed)
The patient has poor appetite and malignant cachexia. She has minimum dysphagia. Denies recent choking sensation. We discussed nutritional support.  She is currently on bland diet. The patient has declined feeding tube placement.

## 2016-08-29 NOTE — Assessment & Plan Note (Signed)
The patient is aware she has incurable disease and any treatment is strictly palliative. We discussed importance of Advanced Directives and Living will. The patient is interested to get enrolled in homebase palliative care/hospice. We would get her advanced directives done at that time. We discussed CODE STATUS; the patient desires to be DNR  She is not interested in any life-sustaining measures such as feeding tube placement of hemodialysis  She shared with me her Laura [name] faith.

## 2016-09-04 DIAGNOSIS — C07 Malignant neoplasm of parotid gland: Secondary | ICD-10-CM | POA: Diagnosis not present

## 2016-09-04 DIAGNOSIS — J439 Emphysema, unspecified: Secondary | ICD-10-CM | POA: Diagnosis not present

## 2016-09-04 DIAGNOSIS — R1310 Dysphagia, unspecified: Secondary | ICD-10-CM | POA: Diagnosis not present

## 2016-09-04 DIAGNOSIS — Z9049 Acquired absence of other specified parts of digestive tract: Secondary | ICD-10-CM | POA: Diagnosis not present

## 2016-09-04 DIAGNOSIS — Z85038 Personal history of other malignant neoplasm of large intestine: Secondary | ICD-10-CM | POA: Diagnosis not present

## 2016-09-04 DIAGNOSIS — Z87891 Personal history of nicotine dependence: Secondary | ICD-10-CM | POA: Diagnosis not present

## 2016-09-04 DIAGNOSIS — R64 Cachexia: Secondary | ICD-10-CM | POA: Diagnosis not present

## 2016-09-04 DIAGNOSIS — C7989 Secondary malignant neoplasm of other specified sites: Secondary | ICD-10-CM | POA: Diagnosis not present

## 2016-09-04 DIAGNOSIS — Z9013 Acquired absence of bilateral breasts and nipples: Secondary | ICD-10-CM | POA: Diagnosis not present

## 2016-09-04 DIAGNOSIS — I10 Essential (primary) hypertension: Secondary | ICD-10-CM | POA: Diagnosis not present

## 2016-09-04 DIAGNOSIS — G518 Other disorders of facial nerve: Secondary | ICD-10-CM | POA: Diagnosis not present

## 2016-09-04 DIAGNOSIS — Z853 Personal history of malignant neoplasm of breast: Secondary | ICD-10-CM | POA: Diagnosis not present

## 2016-09-06 DIAGNOSIS — C7989 Secondary malignant neoplasm of other specified sites: Secondary | ICD-10-CM | POA: Diagnosis not present

## 2016-09-06 DIAGNOSIS — R64 Cachexia: Secondary | ICD-10-CM | POA: Diagnosis not present

## 2016-09-06 DIAGNOSIS — C07 Malignant neoplasm of parotid gland: Secondary | ICD-10-CM | POA: Diagnosis not present

## 2016-09-06 DIAGNOSIS — J439 Emphysema, unspecified: Secondary | ICD-10-CM | POA: Diagnosis not present

## 2016-09-06 DIAGNOSIS — G518 Other disorders of facial nerve: Secondary | ICD-10-CM | POA: Diagnosis not present

## 2016-09-06 DIAGNOSIS — R1310 Dysphagia, unspecified: Secondary | ICD-10-CM | POA: Diagnosis not present

## 2016-09-08 DIAGNOSIS — C07 Malignant neoplasm of parotid gland: Secondary | ICD-10-CM | POA: Diagnosis not present

## 2016-09-08 DIAGNOSIS — R64 Cachexia: Secondary | ICD-10-CM | POA: Diagnosis not present

## 2016-09-08 DIAGNOSIS — R1310 Dysphagia, unspecified: Secondary | ICD-10-CM | POA: Diagnosis not present

## 2016-09-08 DIAGNOSIS — J439 Emphysema, unspecified: Secondary | ICD-10-CM | POA: Diagnosis not present

## 2016-09-08 DIAGNOSIS — G518 Other disorders of facial nerve: Secondary | ICD-10-CM | POA: Diagnosis not present

## 2016-09-08 DIAGNOSIS — C7989 Secondary malignant neoplasm of other specified sites: Secondary | ICD-10-CM | POA: Diagnosis not present

## 2016-09-10 DIAGNOSIS — R64 Cachexia: Secondary | ICD-10-CM | POA: Diagnosis not present

## 2016-09-10 DIAGNOSIS — G518 Other disorders of facial nerve: Secondary | ICD-10-CM | POA: Diagnosis not present

## 2016-09-10 DIAGNOSIS — R1310 Dysphagia, unspecified: Secondary | ICD-10-CM | POA: Diagnosis not present

## 2016-09-10 DIAGNOSIS — C07 Malignant neoplasm of parotid gland: Secondary | ICD-10-CM | POA: Diagnosis not present

## 2016-09-10 DIAGNOSIS — C7989 Secondary malignant neoplasm of other specified sites: Secondary | ICD-10-CM | POA: Diagnosis not present

## 2016-09-10 DIAGNOSIS — J439 Emphysema, unspecified: Secondary | ICD-10-CM | POA: Diagnosis not present

## 2016-09-11 DIAGNOSIS — G518 Other disorders of facial nerve: Secondary | ICD-10-CM | POA: Diagnosis not present

## 2016-09-11 DIAGNOSIS — R64 Cachexia: Secondary | ICD-10-CM | POA: Diagnosis not present

## 2016-09-11 DIAGNOSIS — C7989 Secondary malignant neoplasm of other specified sites: Secondary | ICD-10-CM | POA: Diagnosis not present

## 2016-09-11 DIAGNOSIS — J439 Emphysema, unspecified: Secondary | ICD-10-CM | POA: Diagnosis not present

## 2016-09-11 DIAGNOSIS — R1310 Dysphagia, unspecified: Secondary | ICD-10-CM | POA: Diagnosis not present

## 2016-09-11 DIAGNOSIS — C07 Malignant neoplasm of parotid gland: Secondary | ICD-10-CM | POA: Diagnosis not present

## 2016-09-17 ENCOUNTER — Other Ambulatory Visit: Payer: Self-pay | Admitting: Hematology and Oncology

## 2016-09-17 DIAGNOSIS — R64 Cachexia: Secondary | ICD-10-CM | POA: Diagnosis not present

## 2016-09-17 DIAGNOSIS — J439 Emphysema, unspecified: Secondary | ICD-10-CM | POA: Diagnosis not present

## 2016-09-17 DIAGNOSIS — C07 Malignant neoplasm of parotid gland: Secondary | ICD-10-CM | POA: Diagnosis not present

## 2016-09-17 DIAGNOSIS — G518 Other disorders of facial nerve: Secondary | ICD-10-CM | POA: Diagnosis not present

## 2016-09-17 DIAGNOSIS — C7989 Secondary malignant neoplasm of other specified sites: Secondary | ICD-10-CM | POA: Diagnosis not present

## 2016-09-17 DIAGNOSIS — R1310 Dysphagia, unspecified: Secondary | ICD-10-CM | POA: Diagnosis not present

## 2016-09-24 DIAGNOSIS — J439 Emphysema, unspecified: Secondary | ICD-10-CM | POA: Diagnosis not present

## 2016-09-24 DIAGNOSIS — R1310 Dysphagia, unspecified: Secondary | ICD-10-CM | POA: Diagnosis not present

## 2016-09-24 DIAGNOSIS — R64 Cachexia: Secondary | ICD-10-CM | POA: Diagnosis not present

## 2016-09-24 DIAGNOSIS — G518 Other disorders of facial nerve: Secondary | ICD-10-CM | POA: Diagnosis not present

## 2016-09-24 DIAGNOSIS — C7989 Secondary malignant neoplasm of other specified sites: Secondary | ICD-10-CM | POA: Diagnosis not present

## 2016-09-24 DIAGNOSIS — C07 Malignant neoplasm of parotid gland: Secondary | ICD-10-CM | POA: Diagnosis not present

## 2016-09-28 DIAGNOSIS — Z87891 Personal history of nicotine dependence: Secondary | ICD-10-CM | POA: Diagnosis not present

## 2016-09-28 DIAGNOSIS — J439 Emphysema, unspecified: Secondary | ICD-10-CM | POA: Diagnosis not present

## 2016-09-28 DIAGNOSIS — C7989 Secondary malignant neoplasm of other specified sites: Secondary | ICD-10-CM | POA: Diagnosis not present

## 2016-09-28 DIAGNOSIS — R64 Cachexia: Secondary | ICD-10-CM | POA: Diagnosis not present

## 2016-09-28 DIAGNOSIS — Z85038 Personal history of other malignant neoplasm of large intestine: Secondary | ICD-10-CM | POA: Diagnosis not present

## 2016-09-28 DIAGNOSIS — Z853 Personal history of malignant neoplasm of breast: Secondary | ICD-10-CM | POA: Diagnosis not present

## 2016-09-28 DIAGNOSIS — Z9013 Acquired absence of bilateral breasts and nipples: Secondary | ICD-10-CM | POA: Diagnosis not present

## 2016-09-28 DIAGNOSIS — R1310 Dysphagia, unspecified: Secondary | ICD-10-CM | POA: Diagnosis not present

## 2016-09-28 DIAGNOSIS — G518 Other disorders of facial nerve: Secondary | ICD-10-CM | POA: Diagnosis not present

## 2016-09-28 DIAGNOSIS — Z9049 Acquired absence of other specified parts of digestive tract: Secondary | ICD-10-CM | POA: Diagnosis not present

## 2016-09-28 DIAGNOSIS — I10 Essential (primary) hypertension: Secondary | ICD-10-CM | POA: Diagnosis not present

## 2016-09-28 DIAGNOSIS — C07 Malignant neoplasm of parotid gland: Secondary | ICD-10-CM | POA: Diagnosis not present

## 2016-10-01 DIAGNOSIS — R64 Cachexia: Secondary | ICD-10-CM | POA: Diagnosis not present

## 2016-10-01 DIAGNOSIS — J439 Emphysema, unspecified: Secondary | ICD-10-CM | POA: Diagnosis not present

## 2016-10-01 DIAGNOSIS — R1310 Dysphagia, unspecified: Secondary | ICD-10-CM | POA: Diagnosis not present

## 2016-10-01 DIAGNOSIS — C07 Malignant neoplasm of parotid gland: Secondary | ICD-10-CM | POA: Diagnosis not present

## 2016-10-01 DIAGNOSIS — G518 Other disorders of facial nerve: Secondary | ICD-10-CM | POA: Diagnosis not present

## 2016-10-01 DIAGNOSIS — C7989 Secondary malignant neoplasm of other specified sites: Secondary | ICD-10-CM | POA: Diagnosis not present

## 2016-10-08 DIAGNOSIS — R64 Cachexia: Secondary | ICD-10-CM | POA: Diagnosis not present

## 2016-10-08 DIAGNOSIS — C07 Malignant neoplasm of parotid gland: Secondary | ICD-10-CM | POA: Diagnosis not present

## 2016-10-08 DIAGNOSIS — J439 Emphysema, unspecified: Secondary | ICD-10-CM | POA: Diagnosis not present

## 2016-10-08 DIAGNOSIS — G518 Other disorders of facial nerve: Secondary | ICD-10-CM | POA: Diagnosis not present

## 2016-10-08 DIAGNOSIS — C7989 Secondary malignant neoplasm of other specified sites: Secondary | ICD-10-CM | POA: Diagnosis not present

## 2016-10-08 DIAGNOSIS — R1310 Dysphagia, unspecified: Secondary | ICD-10-CM | POA: Diagnosis not present

## 2016-10-15 DIAGNOSIS — C07 Malignant neoplasm of parotid gland: Secondary | ICD-10-CM | POA: Diagnosis not present

## 2016-10-15 DIAGNOSIS — G518 Other disorders of facial nerve: Secondary | ICD-10-CM | POA: Diagnosis not present

## 2016-10-15 DIAGNOSIS — R64 Cachexia: Secondary | ICD-10-CM | POA: Diagnosis not present

## 2016-10-15 DIAGNOSIS — J439 Emphysema, unspecified: Secondary | ICD-10-CM | POA: Diagnosis not present

## 2016-10-15 DIAGNOSIS — R1310 Dysphagia, unspecified: Secondary | ICD-10-CM | POA: Diagnosis not present

## 2016-10-15 DIAGNOSIS — C7989 Secondary malignant neoplasm of other specified sites: Secondary | ICD-10-CM | POA: Diagnosis not present

## 2016-10-16 DIAGNOSIS — J439 Emphysema, unspecified: Secondary | ICD-10-CM | POA: Diagnosis not present

## 2016-10-16 DIAGNOSIS — C7989 Secondary malignant neoplasm of other specified sites: Secondary | ICD-10-CM | POA: Diagnosis not present

## 2016-10-16 DIAGNOSIS — C07 Malignant neoplasm of parotid gland: Secondary | ICD-10-CM | POA: Diagnosis not present

## 2016-10-16 DIAGNOSIS — G518 Other disorders of facial nerve: Secondary | ICD-10-CM | POA: Diagnosis not present

## 2016-10-16 DIAGNOSIS — R64 Cachexia: Secondary | ICD-10-CM | POA: Diagnosis not present

## 2016-10-16 DIAGNOSIS — R1310 Dysphagia, unspecified: Secondary | ICD-10-CM | POA: Diagnosis not present

## 2016-10-20 DIAGNOSIS — R1310 Dysphagia, unspecified: Secondary | ICD-10-CM | POA: Diagnosis not present

## 2016-10-20 DIAGNOSIS — C07 Malignant neoplasm of parotid gland: Secondary | ICD-10-CM | POA: Diagnosis not present

## 2016-10-20 DIAGNOSIS — J439 Emphysema, unspecified: Secondary | ICD-10-CM | POA: Diagnosis not present

## 2016-10-20 DIAGNOSIS — R64 Cachexia: Secondary | ICD-10-CM | POA: Diagnosis not present

## 2016-10-20 DIAGNOSIS — G518 Other disorders of facial nerve: Secondary | ICD-10-CM | POA: Diagnosis not present

## 2016-10-20 DIAGNOSIS — C7989 Secondary malignant neoplasm of other specified sites: Secondary | ICD-10-CM | POA: Diagnosis not present

## 2016-10-22 DIAGNOSIS — G518 Other disorders of facial nerve: Secondary | ICD-10-CM | POA: Diagnosis not present

## 2016-10-22 DIAGNOSIS — R1310 Dysphagia, unspecified: Secondary | ICD-10-CM | POA: Diagnosis not present

## 2016-10-22 DIAGNOSIS — C07 Malignant neoplasm of parotid gland: Secondary | ICD-10-CM | POA: Diagnosis not present

## 2016-10-22 DIAGNOSIS — C7989 Secondary malignant neoplasm of other specified sites: Secondary | ICD-10-CM | POA: Diagnosis not present

## 2016-10-22 DIAGNOSIS — R64 Cachexia: Secondary | ICD-10-CM | POA: Diagnosis not present

## 2016-10-22 DIAGNOSIS — J439 Emphysema, unspecified: Secondary | ICD-10-CM | POA: Diagnosis not present

## 2016-10-28 DIAGNOSIS — I10 Essential (primary) hypertension: Secondary | ICD-10-CM | POA: Diagnosis not present

## 2016-10-28 DIAGNOSIS — J439 Emphysema, unspecified: Secondary | ICD-10-CM | POA: Diagnosis not present

## 2016-10-28 DIAGNOSIS — R64 Cachexia: Secondary | ICD-10-CM | POA: Diagnosis not present

## 2016-10-28 DIAGNOSIS — G518 Other disorders of facial nerve: Secondary | ICD-10-CM | POA: Diagnosis not present

## 2016-10-28 DIAGNOSIS — R1310 Dysphagia, unspecified: Secondary | ICD-10-CM | POA: Diagnosis not present

## 2016-10-28 DIAGNOSIS — Z9049 Acquired absence of other specified parts of digestive tract: Secondary | ICD-10-CM | POA: Diagnosis not present

## 2016-10-28 DIAGNOSIS — C07 Malignant neoplasm of parotid gland: Secondary | ICD-10-CM | POA: Diagnosis not present

## 2016-10-28 DIAGNOSIS — C7989 Secondary malignant neoplasm of other specified sites: Secondary | ICD-10-CM | POA: Diagnosis not present

## 2016-10-28 DIAGNOSIS — Z9013 Acquired absence of bilateral breasts and nipples: Secondary | ICD-10-CM | POA: Diagnosis not present

## 2016-10-28 DIAGNOSIS — Z85038 Personal history of other malignant neoplasm of large intestine: Secondary | ICD-10-CM | POA: Diagnosis not present

## 2016-10-28 DIAGNOSIS — Z87891 Personal history of nicotine dependence: Secondary | ICD-10-CM | POA: Diagnosis not present

## 2016-10-28 DIAGNOSIS — Z853 Personal history of malignant neoplasm of breast: Secondary | ICD-10-CM | POA: Diagnosis not present

## 2016-10-30 DIAGNOSIS — G518 Other disorders of facial nerve: Secondary | ICD-10-CM | POA: Diagnosis not present

## 2016-10-30 DIAGNOSIS — R1310 Dysphagia, unspecified: Secondary | ICD-10-CM | POA: Diagnosis not present

## 2016-10-30 DIAGNOSIS — C07 Malignant neoplasm of parotid gland: Secondary | ICD-10-CM | POA: Diagnosis not present

## 2016-10-30 DIAGNOSIS — J439 Emphysema, unspecified: Secondary | ICD-10-CM | POA: Diagnosis not present

## 2016-10-30 DIAGNOSIS — C7989 Secondary malignant neoplasm of other specified sites: Secondary | ICD-10-CM | POA: Diagnosis not present

## 2016-10-30 DIAGNOSIS — R64 Cachexia: Secondary | ICD-10-CM | POA: Diagnosis not present

## 2016-11-05 DIAGNOSIS — R1310 Dysphagia, unspecified: Secondary | ICD-10-CM | POA: Diagnosis not present

## 2016-11-05 DIAGNOSIS — C7989 Secondary malignant neoplasm of other specified sites: Secondary | ICD-10-CM | POA: Diagnosis not present

## 2016-11-05 DIAGNOSIS — R64 Cachexia: Secondary | ICD-10-CM | POA: Diagnosis not present

## 2016-11-05 DIAGNOSIS — J439 Emphysema, unspecified: Secondary | ICD-10-CM | POA: Diagnosis not present

## 2016-11-05 DIAGNOSIS — G518 Other disorders of facial nerve: Secondary | ICD-10-CM | POA: Diagnosis not present

## 2016-11-05 DIAGNOSIS — C07 Malignant neoplasm of parotid gland: Secondary | ICD-10-CM | POA: Diagnosis not present

## 2016-11-11 DIAGNOSIS — C7989 Secondary malignant neoplasm of other specified sites: Secondary | ICD-10-CM | POA: Diagnosis not present

## 2016-11-11 DIAGNOSIS — J439 Emphysema, unspecified: Secondary | ICD-10-CM | POA: Diagnosis not present

## 2016-11-11 DIAGNOSIS — C07 Malignant neoplasm of parotid gland: Secondary | ICD-10-CM | POA: Diagnosis not present

## 2016-11-11 DIAGNOSIS — G518 Other disorders of facial nerve: Secondary | ICD-10-CM | POA: Diagnosis not present

## 2016-11-11 DIAGNOSIS — R1310 Dysphagia, unspecified: Secondary | ICD-10-CM | POA: Diagnosis not present

## 2016-11-11 DIAGNOSIS — R64 Cachexia: Secondary | ICD-10-CM | POA: Diagnosis not present

## 2016-11-12 DIAGNOSIS — R64 Cachexia: Secondary | ICD-10-CM | POA: Diagnosis not present

## 2016-11-12 DIAGNOSIS — J439 Emphysema, unspecified: Secondary | ICD-10-CM | POA: Diagnosis not present

## 2016-11-12 DIAGNOSIS — R1310 Dysphagia, unspecified: Secondary | ICD-10-CM | POA: Diagnosis not present

## 2016-11-12 DIAGNOSIS — C07 Malignant neoplasm of parotid gland: Secondary | ICD-10-CM | POA: Diagnosis not present

## 2016-11-12 DIAGNOSIS — C7989 Secondary malignant neoplasm of other specified sites: Secondary | ICD-10-CM | POA: Diagnosis not present

## 2016-11-12 DIAGNOSIS — G518 Other disorders of facial nerve: Secondary | ICD-10-CM | POA: Diagnosis not present

## 2016-11-13 DIAGNOSIS — C7989 Secondary malignant neoplasm of other specified sites: Secondary | ICD-10-CM | POA: Diagnosis not present

## 2016-11-13 DIAGNOSIS — G518 Other disorders of facial nerve: Secondary | ICD-10-CM | POA: Diagnosis not present

## 2016-11-13 DIAGNOSIS — J439 Emphysema, unspecified: Secondary | ICD-10-CM | POA: Diagnosis not present

## 2016-11-13 DIAGNOSIS — C07 Malignant neoplasm of parotid gland: Secondary | ICD-10-CM | POA: Diagnosis not present

## 2016-11-13 DIAGNOSIS — R64 Cachexia: Secondary | ICD-10-CM | POA: Diagnosis not present

## 2016-11-13 DIAGNOSIS — R1310 Dysphagia, unspecified: Secondary | ICD-10-CM | POA: Diagnosis not present

## 2016-11-18 ENCOUNTER — Telehealth: Payer: Self-pay

## 2016-11-18 NOTE — Telephone Encounter (Signed)
Stephanie with [place] called and left message that she needed a verbal order to continue hospice services. Per Dr. Alvy Bimler called and verbal order the phone to continue hospice services.

## 2016-11-19 DIAGNOSIS — G518 Other disorders of facial nerve: Secondary | ICD-10-CM | POA: Diagnosis not present

## 2016-11-19 DIAGNOSIS — C7989 Secondary malignant neoplasm of other specified sites: Secondary | ICD-10-CM | POA: Diagnosis not present

## 2016-11-19 DIAGNOSIS — R1310 Dysphagia, unspecified: Secondary | ICD-10-CM | POA: Diagnosis not present

## 2016-11-19 DIAGNOSIS — J439 Emphysema, unspecified: Secondary | ICD-10-CM | POA: Diagnosis not present

## 2016-11-19 DIAGNOSIS — C07 Malignant neoplasm of parotid gland: Secondary | ICD-10-CM | POA: Diagnosis not present

## 2016-11-19 DIAGNOSIS — R64 Cachexia: Secondary | ICD-10-CM | POA: Diagnosis not present

## 2016-11-26 DIAGNOSIS — C07 Malignant neoplasm of parotid gland: Secondary | ICD-10-CM | POA: Diagnosis not present

## 2016-11-26 DIAGNOSIS — C7989 Secondary malignant neoplasm of other specified sites: Secondary | ICD-10-CM | POA: Diagnosis not present

## 2016-11-26 DIAGNOSIS — G518 Other disorders of facial nerve: Secondary | ICD-10-CM | POA: Diagnosis not present

## 2016-11-26 DIAGNOSIS — R1310 Dysphagia, unspecified: Secondary | ICD-10-CM | POA: Diagnosis not present

## 2016-11-26 DIAGNOSIS — J439 Emphysema, unspecified: Secondary | ICD-10-CM | POA: Diagnosis not present

## 2016-11-26 DIAGNOSIS — R64 Cachexia: Secondary | ICD-10-CM | POA: Diagnosis not present

## 2016-11-28 DIAGNOSIS — Z853 Personal history of malignant neoplasm of breast: Secondary | ICD-10-CM | POA: Diagnosis not present

## 2016-11-28 DIAGNOSIS — R1310 Dysphagia, unspecified: Secondary | ICD-10-CM | POA: Diagnosis not present

## 2016-11-28 DIAGNOSIS — I10 Essential (primary) hypertension: Secondary | ICD-10-CM | POA: Diagnosis not present

## 2016-11-28 DIAGNOSIS — R64 Cachexia: Secondary | ICD-10-CM | POA: Diagnosis not present

## 2016-11-28 DIAGNOSIS — J439 Emphysema, unspecified: Secondary | ICD-10-CM | POA: Diagnosis not present

## 2016-11-28 DIAGNOSIS — Z87891 Personal history of nicotine dependence: Secondary | ICD-10-CM | POA: Diagnosis not present

## 2016-11-28 DIAGNOSIS — Z9013 Acquired absence of bilateral breasts and nipples: Secondary | ICD-10-CM | POA: Diagnosis not present

## 2016-11-28 DIAGNOSIS — C7989 Secondary malignant neoplasm of other specified sites: Secondary | ICD-10-CM | POA: Diagnosis not present

## 2016-11-28 DIAGNOSIS — Z85038 Personal history of other malignant neoplasm of large intestine: Secondary | ICD-10-CM | POA: Diagnosis not present

## 2016-11-28 DIAGNOSIS — C07 Malignant neoplasm of parotid gland: Secondary | ICD-10-CM | POA: Diagnosis not present

## 2016-11-28 DIAGNOSIS — G518 Other disorders of facial nerve: Secondary | ICD-10-CM | POA: Diagnosis not present

## 2016-11-28 DIAGNOSIS — Z9049 Acquired absence of other specified parts of digestive tract: Secondary | ICD-10-CM | POA: Diagnosis not present

## 2016-11-30 DIAGNOSIS — C7989 Secondary malignant neoplasm of other specified sites: Secondary | ICD-10-CM | POA: Diagnosis not present

## 2016-11-30 DIAGNOSIS — R1310 Dysphagia, unspecified: Secondary | ICD-10-CM | POA: Diagnosis not present

## 2016-11-30 DIAGNOSIS — R64 Cachexia: Secondary | ICD-10-CM | POA: Diagnosis not present

## 2016-11-30 DIAGNOSIS — C07 Malignant neoplasm of parotid gland: Secondary | ICD-10-CM | POA: Diagnosis not present

## 2016-11-30 DIAGNOSIS — G518 Other disorders of facial nerve: Secondary | ICD-10-CM | POA: Diagnosis not present

## 2016-11-30 DIAGNOSIS — J439 Emphysema, unspecified: Secondary | ICD-10-CM | POA: Diagnosis not present

## 2016-12-03 DIAGNOSIS — G518 Other disorders of facial nerve: Secondary | ICD-10-CM | POA: Diagnosis not present

## 2016-12-03 DIAGNOSIS — J439 Emphysema, unspecified: Secondary | ICD-10-CM | POA: Diagnosis not present

## 2016-12-03 DIAGNOSIS — R64 Cachexia: Secondary | ICD-10-CM | POA: Diagnosis not present

## 2016-12-03 DIAGNOSIS — R1310 Dysphagia, unspecified: Secondary | ICD-10-CM | POA: Diagnosis not present

## 2016-12-03 DIAGNOSIS — C07 Malignant neoplasm of parotid gland: Secondary | ICD-10-CM | POA: Diagnosis not present

## 2016-12-03 DIAGNOSIS — C7989 Secondary malignant neoplasm of other specified sites: Secondary | ICD-10-CM | POA: Diagnosis not present

## 2016-12-05 DIAGNOSIS — G518 Other disorders of facial nerve: Secondary | ICD-10-CM | POA: Diagnosis not present

## 2016-12-05 DIAGNOSIS — C7989 Secondary malignant neoplasm of other specified sites: Secondary | ICD-10-CM | POA: Diagnosis not present

## 2016-12-05 DIAGNOSIS — J439 Emphysema, unspecified: Secondary | ICD-10-CM | POA: Diagnosis not present

## 2016-12-05 DIAGNOSIS — C07 Malignant neoplasm of parotid gland: Secondary | ICD-10-CM | POA: Diagnosis not present

## 2016-12-05 DIAGNOSIS — R64 Cachexia: Secondary | ICD-10-CM | POA: Diagnosis not present

## 2016-12-05 DIAGNOSIS — R1310 Dysphagia, unspecified: Secondary | ICD-10-CM | POA: Diagnosis not present

## 2016-12-11 DIAGNOSIS — R1310 Dysphagia, unspecified: Secondary | ICD-10-CM | POA: Diagnosis not present

## 2016-12-11 DIAGNOSIS — J439 Emphysema, unspecified: Secondary | ICD-10-CM | POA: Diagnosis not present

## 2016-12-11 DIAGNOSIS — C07 Malignant neoplasm of parotid gland: Secondary | ICD-10-CM | POA: Diagnosis not present

## 2016-12-11 DIAGNOSIS — C7989 Secondary malignant neoplasm of other specified sites: Secondary | ICD-10-CM | POA: Diagnosis not present

## 2016-12-11 DIAGNOSIS — G518 Other disorders of facial nerve: Secondary | ICD-10-CM | POA: Diagnosis not present

## 2016-12-11 DIAGNOSIS — R64 Cachexia: Secondary | ICD-10-CM | POA: Diagnosis not present

## 2016-12-17 DIAGNOSIS — J439 Emphysema, unspecified: Secondary | ICD-10-CM | POA: Diagnosis not present

## 2016-12-17 DIAGNOSIS — G518 Other disorders of facial nerve: Secondary | ICD-10-CM | POA: Diagnosis not present

## 2016-12-17 DIAGNOSIS — C7989 Secondary malignant neoplasm of other specified sites: Secondary | ICD-10-CM | POA: Diagnosis not present

## 2016-12-17 DIAGNOSIS — R1310 Dysphagia, unspecified: Secondary | ICD-10-CM | POA: Diagnosis not present

## 2016-12-17 DIAGNOSIS — C07 Malignant neoplasm of parotid gland: Secondary | ICD-10-CM | POA: Diagnosis not present

## 2016-12-17 DIAGNOSIS — R64 Cachexia: Secondary | ICD-10-CM | POA: Diagnosis not present

## 2016-12-24 DIAGNOSIS — G518 Other disorders of facial nerve: Secondary | ICD-10-CM | POA: Diagnosis not present

## 2016-12-24 DIAGNOSIS — C7989 Secondary malignant neoplasm of other specified sites: Secondary | ICD-10-CM | POA: Diagnosis not present

## 2016-12-24 DIAGNOSIS — C07 Malignant neoplasm of parotid gland: Secondary | ICD-10-CM | POA: Diagnosis not present

## 2016-12-24 DIAGNOSIS — R1310 Dysphagia, unspecified: Secondary | ICD-10-CM | POA: Diagnosis not present

## 2016-12-24 DIAGNOSIS — R64 Cachexia: Secondary | ICD-10-CM | POA: Diagnosis not present

## 2016-12-24 DIAGNOSIS — J439 Emphysema, unspecified: Secondary | ICD-10-CM | POA: Diagnosis not present

## 2016-12-28 DIAGNOSIS — Z87891 Personal history of nicotine dependence: Secondary | ICD-10-CM | POA: Diagnosis not present

## 2016-12-28 DIAGNOSIS — C07 Malignant neoplasm of parotid gland: Secondary | ICD-10-CM | POA: Diagnosis not present

## 2016-12-28 DIAGNOSIS — G518 Other disorders of facial nerve: Secondary | ICD-10-CM | POA: Diagnosis not present

## 2016-12-28 DIAGNOSIS — R1310 Dysphagia, unspecified: Secondary | ICD-10-CM | POA: Diagnosis not present

## 2016-12-28 DIAGNOSIS — Z85038 Personal history of other malignant neoplasm of large intestine: Secondary | ICD-10-CM | POA: Diagnosis not present

## 2016-12-28 DIAGNOSIS — I10 Essential (primary) hypertension: Secondary | ICD-10-CM | POA: Diagnosis not present

## 2016-12-28 DIAGNOSIS — C7989 Secondary malignant neoplasm of other specified sites: Secondary | ICD-10-CM | POA: Diagnosis not present

## 2016-12-28 DIAGNOSIS — Z9013 Acquired absence of bilateral breasts and nipples: Secondary | ICD-10-CM | POA: Diagnosis not present

## 2016-12-28 DIAGNOSIS — Z853 Personal history of malignant neoplasm of breast: Secondary | ICD-10-CM | POA: Diagnosis not present

## 2016-12-28 DIAGNOSIS — Z9049 Acquired absence of other specified parts of digestive tract: Secondary | ICD-10-CM | POA: Diagnosis not present

## 2016-12-28 DIAGNOSIS — J439 Emphysema, unspecified: Secondary | ICD-10-CM | POA: Diagnosis not present

## 2016-12-28 DIAGNOSIS — R64 Cachexia: Secondary | ICD-10-CM | POA: Diagnosis not present

## 2016-12-30 DIAGNOSIS — C7989 Secondary malignant neoplasm of other specified sites: Secondary | ICD-10-CM | POA: Diagnosis not present

## 2016-12-30 DIAGNOSIS — G518 Other disorders of facial nerve: Secondary | ICD-10-CM | POA: Diagnosis not present

## 2016-12-30 DIAGNOSIS — R64 Cachexia: Secondary | ICD-10-CM | POA: Diagnosis not present

## 2016-12-30 DIAGNOSIS — J439 Emphysema, unspecified: Secondary | ICD-10-CM | POA: Diagnosis not present

## 2016-12-30 DIAGNOSIS — C07 Malignant neoplasm of parotid gland: Secondary | ICD-10-CM | POA: Diagnosis not present

## 2016-12-30 DIAGNOSIS — R1310 Dysphagia, unspecified: Secondary | ICD-10-CM | POA: Diagnosis not present

## 2017-01-02 DIAGNOSIS — R1310 Dysphagia, unspecified: Secondary | ICD-10-CM | POA: Diagnosis not present

## 2017-01-02 DIAGNOSIS — C07 Malignant neoplasm of parotid gland: Secondary | ICD-10-CM | POA: Diagnosis not present

## 2017-01-02 DIAGNOSIS — C7989 Secondary malignant neoplasm of other specified sites: Secondary | ICD-10-CM | POA: Diagnosis not present

## 2017-01-02 DIAGNOSIS — R64 Cachexia: Secondary | ICD-10-CM | POA: Diagnosis not present

## 2017-01-02 DIAGNOSIS — J439 Emphysema, unspecified: Secondary | ICD-10-CM | POA: Diagnosis not present

## 2017-01-02 DIAGNOSIS — G518 Other disorders of facial nerve: Secondary | ICD-10-CM | POA: Diagnosis not present

## 2017-01-07 DIAGNOSIS — J439 Emphysema, unspecified: Secondary | ICD-10-CM | POA: Diagnosis not present

## 2017-01-07 DIAGNOSIS — R64 Cachexia: Secondary | ICD-10-CM | POA: Diagnosis not present

## 2017-01-07 DIAGNOSIS — C07 Malignant neoplasm of parotid gland: Secondary | ICD-10-CM | POA: Diagnosis not present

## 2017-01-07 DIAGNOSIS — G518 Other disorders of facial nerve: Secondary | ICD-10-CM | POA: Diagnosis not present

## 2017-01-07 DIAGNOSIS — C7989 Secondary malignant neoplasm of other specified sites: Secondary | ICD-10-CM | POA: Diagnosis not present

## 2017-01-07 DIAGNOSIS — R1310 Dysphagia, unspecified: Secondary | ICD-10-CM | POA: Diagnosis not present

## 2017-01-14 DIAGNOSIS — C7989 Secondary malignant neoplasm of other specified sites: Secondary | ICD-10-CM | POA: Diagnosis not present

## 2017-01-14 DIAGNOSIS — G518 Other disorders of facial nerve: Secondary | ICD-10-CM | POA: Diagnosis not present

## 2017-01-14 DIAGNOSIS — C07 Malignant neoplasm of parotid gland: Secondary | ICD-10-CM | POA: Diagnosis not present

## 2017-01-14 DIAGNOSIS — J439 Emphysema, unspecified: Secondary | ICD-10-CM | POA: Diagnosis not present

## 2017-01-14 DIAGNOSIS — R1310 Dysphagia, unspecified: Secondary | ICD-10-CM | POA: Diagnosis not present

## 2017-01-14 DIAGNOSIS — R64 Cachexia: Secondary | ICD-10-CM | POA: Diagnosis not present

## 2017-01-21 DIAGNOSIS — G518 Other disorders of facial nerve: Secondary | ICD-10-CM | POA: Diagnosis not present

## 2017-01-21 DIAGNOSIS — J439 Emphysema, unspecified: Secondary | ICD-10-CM | POA: Diagnosis not present

## 2017-01-21 DIAGNOSIS — C7989 Secondary malignant neoplasm of other specified sites: Secondary | ICD-10-CM | POA: Diagnosis not present

## 2017-01-21 DIAGNOSIS — R64 Cachexia: Secondary | ICD-10-CM | POA: Diagnosis not present

## 2017-01-21 DIAGNOSIS — R1310 Dysphagia, unspecified: Secondary | ICD-10-CM | POA: Diagnosis not present

## 2017-01-21 DIAGNOSIS — C07 Malignant neoplasm of parotid gland: Secondary | ICD-10-CM | POA: Diagnosis not present

## 2017-01-22 DIAGNOSIS — R1310 Dysphagia, unspecified: Secondary | ICD-10-CM | POA: Diagnosis not present

## 2017-01-22 DIAGNOSIS — C7989 Secondary malignant neoplasm of other specified sites: Secondary | ICD-10-CM | POA: Diagnosis not present

## 2017-01-22 DIAGNOSIS — G518 Other disorders of facial nerve: Secondary | ICD-10-CM | POA: Diagnosis not present

## 2017-01-22 DIAGNOSIS — R64 Cachexia: Secondary | ICD-10-CM | POA: Diagnosis not present

## 2017-01-22 DIAGNOSIS — C07 Malignant neoplasm of parotid gland: Secondary | ICD-10-CM | POA: Diagnosis not present

## 2017-01-22 DIAGNOSIS — J439 Emphysema, unspecified: Secondary | ICD-10-CM | POA: Diagnosis not present

## 2017-01-28 DIAGNOSIS — Z87891 Personal history of nicotine dependence: Secondary | ICD-10-CM | POA: Diagnosis not present

## 2017-01-28 DIAGNOSIS — Z853 Personal history of malignant neoplasm of breast: Secondary | ICD-10-CM | POA: Diagnosis not present

## 2017-01-28 DIAGNOSIS — R1310 Dysphagia, unspecified: Secondary | ICD-10-CM | POA: Diagnosis not present

## 2017-01-28 DIAGNOSIS — I10 Essential (primary) hypertension: Secondary | ICD-10-CM | POA: Diagnosis not present

## 2017-01-28 DIAGNOSIS — Z85038 Personal history of other malignant neoplasm of large intestine: Secondary | ICD-10-CM | POA: Diagnosis not present

## 2017-01-28 DIAGNOSIS — Z9013 Acquired absence of bilateral breasts and nipples: Secondary | ICD-10-CM | POA: Diagnosis not present

## 2017-01-28 DIAGNOSIS — R64 Cachexia: Secondary | ICD-10-CM | POA: Diagnosis not present

## 2017-01-28 DIAGNOSIS — G518 Other disorders of facial nerve: Secondary | ICD-10-CM | POA: Diagnosis not present

## 2017-01-28 DIAGNOSIS — C7989 Secondary malignant neoplasm of other specified sites: Secondary | ICD-10-CM | POA: Diagnosis not present

## 2017-01-28 DIAGNOSIS — Z9049 Acquired absence of other specified parts of digestive tract: Secondary | ICD-10-CM | POA: Diagnosis not present

## 2017-01-28 DIAGNOSIS — J439 Emphysema, unspecified: Secondary | ICD-10-CM | POA: Diagnosis not present

## 2017-01-28 DIAGNOSIS — C07 Malignant neoplasm of parotid gland: Secondary | ICD-10-CM | POA: Diagnosis not present

## 2017-02-04 DIAGNOSIS — R64 Cachexia: Secondary | ICD-10-CM | POA: Diagnosis not present

## 2017-02-04 DIAGNOSIS — C7989 Secondary malignant neoplasm of other specified sites: Secondary | ICD-10-CM | POA: Diagnosis not present

## 2017-02-04 DIAGNOSIS — C07 Malignant neoplasm of parotid gland: Secondary | ICD-10-CM | POA: Diagnosis not present

## 2017-02-04 DIAGNOSIS — R1310 Dysphagia, unspecified: Secondary | ICD-10-CM | POA: Diagnosis not present

## 2017-02-04 DIAGNOSIS — G518 Other disorders of facial nerve: Secondary | ICD-10-CM | POA: Diagnosis not present

## 2017-02-04 DIAGNOSIS — J439 Emphysema, unspecified: Secondary | ICD-10-CM | POA: Diagnosis not present

## 2017-02-11 DIAGNOSIS — C7989 Secondary malignant neoplasm of other specified sites: Secondary | ICD-10-CM | POA: Diagnosis not present

## 2017-02-11 DIAGNOSIS — R1310 Dysphagia, unspecified: Secondary | ICD-10-CM | POA: Diagnosis not present

## 2017-02-11 DIAGNOSIS — G518 Other disorders of facial nerve: Secondary | ICD-10-CM | POA: Diagnosis not present

## 2017-02-11 DIAGNOSIS — J439 Emphysema, unspecified: Secondary | ICD-10-CM | POA: Diagnosis not present

## 2017-02-11 DIAGNOSIS — C07 Malignant neoplasm of parotid gland: Secondary | ICD-10-CM | POA: Diagnosis not present

## 2017-02-11 DIAGNOSIS — R64 Cachexia: Secondary | ICD-10-CM | POA: Diagnosis not present

## 2017-02-16 DIAGNOSIS — G518 Other disorders of facial nerve: Secondary | ICD-10-CM | POA: Diagnosis not present

## 2017-02-16 DIAGNOSIS — J439 Emphysema, unspecified: Secondary | ICD-10-CM | POA: Diagnosis not present

## 2017-02-16 DIAGNOSIS — R64 Cachexia: Secondary | ICD-10-CM | POA: Diagnosis not present

## 2017-02-16 DIAGNOSIS — C07 Malignant neoplasm of parotid gland: Secondary | ICD-10-CM | POA: Diagnosis not present

## 2017-02-16 DIAGNOSIS — R1310 Dysphagia, unspecified: Secondary | ICD-10-CM | POA: Diagnosis not present

## 2017-02-16 DIAGNOSIS — C7989 Secondary malignant neoplasm of other specified sites: Secondary | ICD-10-CM | POA: Diagnosis not present

## 2017-02-19 DIAGNOSIS — R1310 Dysphagia, unspecified: Secondary | ICD-10-CM | POA: Diagnosis not present

## 2017-02-19 DIAGNOSIS — R64 Cachexia: Secondary | ICD-10-CM | POA: Diagnosis not present

## 2017-02-19 DIAGNOSIS — G518 Other disorders of facial nerve: Secondary | ICD-10-CM | POA: Diagnosis not present

## 2017-02-19 DIAGNOSIS — C7989 Secondary malignant neoplasm of other specified sites: Secondary | ICD-10-CM | POA: Diagnosis not present

## 2017-02-19 DIAGNOSIS — J439 Emphysema, unspecified: Secondary | ICD-10-CM | POA: Diagnosis not present

## 2017-02-19 DIAGNOSIS — C07 Malignant neoplasm of parotid gland: Secondary | ICD-10-CM | POA: Diagnosis not present

## 2017-02-26 DIAGNOSIS — R1310 Dysphagia, unspecified: Secondary | ICD-10-CM | POA: Diagnosis not present

## 2017-02-26 DIAGNOSIS — C07 Malignant neoplasm of parotid gland: Secondary | ICD-10-CM | POA: Diagnosis not present

## 2017-02-26 DIAGNOSIS — R64 Cachexia: Secondary | ICD-10-CM | POA: Diagnosis not present

## 2017-02-26 DIAGNOSIS — J439 Emphysema, unspecified: Secondary | ICD-10-CM | POA: Diagnosis not present

## 2017-02-26 DIAGNOSIS — G518 Other disorders of facial nerve: Secondary | ICD-10-CM | POA: Diagnosis not present

## 2017-02-26 DIAGNOSIS — C7989 Secondary malignant neoplasm of other specified sites: Secondary | ICD-10-CM | POA: Diagnosis not present

## 2017-02-28 DIAGNOSIS — J439 Emphysema, unspecified: Secondary | ICD-10-CM | POA: Diagnosis not present

## 2017-02-28 DIAGNOSIS — Z853 Personal history of malignant neoplasm of breast: Secondary | ICD-10-CM | POA: Diagnosis not present

## 2017-02-28 DIAGNOSIS — Z87891 Personal history of nicotine dependence: Secondary | ICD-10-CM | POA: Diagnosis not present

## 2017-02-28 DIAGNOSIS — R1310 Dysphagia, unspecified: Secondary | ICD-10-CM | POA: Diagnosis not present

## 2017-02-28 DIAGNOSIS — Z85038 Personal history of other malignant neoplasm of large intestine: Secondary | ICD-10-CM | POA: Diagnosis not present

## 2017-02-28 DIAGNOSIS — Z9013 Acquired absence of bilateral breasts and nipples: Secondary | ICD-10-CM | POA: Diagnosis not present

## 2017-02-28 DIAGNOSIS — I10 Essential (primary) hypertension: Secondary | ICD-10-CM | POA: Diagnosis not present

## 2017-02-28 DIAGNOSIS — C7989 Secondary malignant neoplasm of other specified sites: Secondary | ICD-10-CM | POA: Diagnosis not present

## 2017-02-28 DIAGNOSIS — G518 Other disorders of facial nerve: Secondary | ICD-10-CM | POA: Diagnosis not present

## 2017-02-28 DIAGNOSIS — Z9049 Acquired absence of other specified parts of digestive tract: Secondary | ICD-10-CM | POA: Diagnosis not present

## 2017-02-28 DIAGNOSIS — R64 Cachexia: Secondary | ICD-10-CM | POA: Diagnosis not present

## 2017-02-28 DIAGNOSIS — C07 Malignant neoplasm of parotid gland: Secondary | ICD-10-CM | POA: Diagnosis not present

## 2017-03-05 DIAGNOSIS — J439 Emphysema, unspecified: Secondary | ICD-10-CM | POA: Diagnosis not present

## 2017-03-05 DIAGNOSIS — R1310 Dysphagia, unspecified: Secondary | ICD-10-CM | POA: Diagnosis not present

## 2017-03-05 DIAGNOSIS — G518 Other disorders of facial nerve: Secondary | ICD-10-CM | POA: Diagnosis not present

## 2017-03-05 DIAGNOSIS — C07 Malignant neoplasm of parotid gland: Secondary | ICD-10-CM | POA: Diagnosis not present

## 2017-03-05 DIAGNOSIS — C7989 Secondary malignant neoplasm of other specified sites: Secondary | ICD-10-CM | POA: Diagnosis not present

## 2017-03-05 DIAGNOSIS — R64 Cachexia: Secondary | ICD-10-CM | POA: Diagnosis not present

## 2017-03-07 DIAGNOSIS — G518 Other disorders of facial nerve: Secondary | ICD-10-CM | POA: Diagnosis not present

## 2017-03-07 DIAGNOSIS — C7989 Secondary malignant neoplasm of other specified sites: Secondary | ICD-10-CM | POA: Diagnosis not present

## 2017-03-07 DIAGNOSIS — C07 Malignant neoplasm of parotid gland: Secondary | ICD-10-CM | POA: Diagnosis not present

## 2017-03-07 DIAGNOSIS — J439 Emphysema, unspecified: Secondary | ICD-10-CM | POA: Diagnosis not present

## 2017-03-07 DIAGNOSIS — R64 Cachexia: Secondary | ICD-10-CM | POA: Diagnosis not present

## 2017-03-07 DIAGNOSIS — R1310 Dysphagia, unspecified: Secondary | ICD-10-CM | POA: Diagnosis not present

## 2017-03-12 DIAGNOSIS — R1310 Dysphagia, unspecified: Secondary | ICD-10-CM | POA: Diagnosis not present

## 2017-03-12 DIAGNOSIS — C07 Malignant neoplasm of parotid gland: Secondary | ICD-10-CM | POA: Diagnosis not present

## 2017-03-12 DIAGNOSIS — J439 Emphysema, unspecified: Secondary | ICD-10-CM | POA: Diagnosis not present

## 2017-03-12 DIAGNOSIS — C7989 Secondary malignant neoplasm of other specified sites: Secondary | ICD-10-CM | POA: Diagnosis not present

## 2017-03-12 DIAGNOSIS — G518 Other disorders of facial nerve: Secondary | ICD-10-CM | POA: Diagnosis not present

## 2017-03-12 DIAGNOSIS — R64 Cachexia: Secondary | ICD-10-CM | POA: Diagnosis not present

## 2017-03-17 DIAGNOSIS — R64 Cachexia: Secondary | ICD-10-CM | POA: Diagnosis not present

## 2017-03-17 DIAGNOSIS — C7989 Secondary malignant neoplasm of other specified sites: Secondary | ICD-10-CM | POA: Diagnosis not present

## 2017-03-17 DIAGNOSIS — J439 Emphysema, unspecified: Secondary | ICD-10-CM | POA: Diagnosis not present

## 2017-03-17 DIAGNOSIS — G518 Other disorders of facial nerve: Secondary | ICD-10-CM | POA: Diagnosis not present

## 2017-03-17 DIAGNOSIS — C07 Malignant neoplasm of parotid gland: Secondary | ICD-10-CM | POA: Diagnosis not present

## 2017-03-17 DIAGNOSIS — R1310 Dysphagia, unspecified: Secondary | ICD-10-CM | POA: Diagnosis not present

## 2017-03-20 DIAGNOSIS — C07 Malignant neoplasm of parotid gland: Secondary | ICD-10-CM | POA: Diagnosis not present

## 2017-03-20 DIAGNOSIS — C7989 Secondary malignant neoplasm of other specified sites: Secondary | ICD-10-CM | POA: Diagnosis not present

## 2017-03-20 DIAGNOSIS — G518 Other disorders of facial nerve: Secondary | ICD-10-CM | POA: Diagnosis not present

## 2017-03-20 DIAGNOSIS — R64 Cachexia: Secondary | ICD-10-CM | POA: Diagnosis not present

## 2017-03-20 DIAGNOSIS — R1310 Dysphagia, unspecified: Secondary | ICD-10-CM | POA: Diagnosis not present

## 2017-03-20 DIAGNOSIS — J439 Emphysema, unspecified: Secondary | ICD-10-CM | POA: Diagnosis not present

## 2017-03-23 DIAGNOSIS — G518 Other disorders of facial nerve: Secondary | ICD-10-CM | POA: Diagnosis not present

## 2017-03-23 DIAGNOSIS — C7989 Secondary malignant neoplasm of other specified sites: Secondary | ICD-10-CM | POA: Diagnosis not present

## 2017-03-23 DIAGNOSIS — J439 Emphysema, unspecified: Secondary | ICD-10-CM | POA: Diagnosis not present

## 2017-03-23 DIAGNOSIS — R1310 Dysphagia, unspecified: Secondary | ICD-10-CM | POA: Diagnosis not present

## 2017-03-23 DIAGNOSIS — R64 Cachexia: Secondary | ICD-10-CM | POA: Diagnosis not present

## 2017-03-23 DIAGNOSIS — C07 Malignant neoplasm of parotid gland: Secondary | ICD-10-CM | POA: Diagnosis not present

## 2017-03-26 DIAGNOSIS — C7989 Secondary malignant neoplasm of other specified sites: Secondary | ICD-10-CM | POA: Diagnosis not present

## 2017-03-26 DIAGNOSIS — J439 Emphysema, unspecified: Secondary | ICD-10-CM | POA: Diagnosis not present

## 2017-03-26 DIAGNOSIS — C07 Malignant neoplasm of parotid gland: Secondary | ICD-10-CM | POA: Diagnosis not present

## 2017-03-26 DIAGNOSIS — R64 Cachexia: Secondary | ICD-10-CM | POA: Diagnosis not present

## 2017-03-26 DIAGNOSIS — R1310 Dysphagia, unspecified: Secondary | ICD-10-CM | POA: Diagnosis not present

## 2017-03-26 DIAGNOSIS — G518 Other disorders of facial nerve: Secondary | ICD-10-CM | POA: Diagnosis not present

## 2017-03-30 DIAGNOSIS — J439 Emphysema, unspecified: Secondary | ICD-10-CM | POA: Diagnosis not present

## 2017-03-30 DIAGNOSIS — Z9049 Acquired absence of other specified parts of digestive tract: Secondary | ICD-10-CM | POA: Diagnosis not present

## 2017-03-30 DIAGNOSIS — R64 Cachexia: Secondary | ICD-10-CM | POA: Diagnosis not present

## 2017-03-30 DIAGNOSIS — Z853 Personal history of malignant neoplasm of breast: Secondary | ICD-10-CM | POA: Diagnosis not present

## 2017-03-30 DIAGNOSIS — C7989 Secondary malignant neoplasm of other specified sites: Secondary | ICD-10-CM | POA: Diagnosis not present

## 2017-03-30 DIAGNOSIS — I10 Essential (primary) hypertension: Secondary | ICD-10-CM | POA: Diagnosis not present

## 2017-03-30 DIAGNOSIS — Z9013 Acquired absence of bilateral breasts and nipples: Secondary | ICD-10-CM | POA: Diagnosis not present

## 2017-03-30 DIAGNOSIS — R1310 Dysphagia, unspecified: Secondary | ICD-10-CM | POA: Diagnosis not present

## 2017-03-30 DIAGNOSIS — G518 Other disorders of facial nerve: Secondary | ICD-10-CM | POA: Diagnosis not present

## 2017-03-30 DIAGNOSIS — Z85038 Personal history of other malignant neoplasm of large intestine: Secondary | ICD-10-CM | POA: Diagnosis not present

## 2017-03-30 DIAGNOSIS — Z87891 Personal history of nicotine dependence: Secondary | ICD-10-CM | POA: Diagnosis not present

## 2017-03-30 DIAGNOSIS — C07 Malignant neoplasm of parotid gland: Secondary | ICD-10-CM | POA: Diagnosis not present

## 2017-04-03 DIAGNOSIS — R64 Cachexia: Secondary | ICD-10-CM | POA: Diagnosis not present

## 2017-04-03 DIAGNOSIS — C7989 Secondary malignant neoplasm of other specified sites: Secondary | ICD-10-CM | POA: Diagnosis not present

## 2017-04-03 DIAGNOSIS — C07 Malignant neoplasm of parotid gland: Secondary | ICD-10-CM | POA: Diagnosis not present

## 2017-04-03 DIAGNOSIS — J439 Emphysema, unspecified: Secondary | ICD-10-CM | POA: Diagnosis not present

## 2017-04-03 DIAGNOSIS — R1310 Dysphagia, unspecified: Secondary | ICD-10-CM | POA: Diagnosis not present

## 2017-04-03 DIAGNOSIS — G518 Other disorders of facial nerve: Secondary | ICD-10-CM | POA: Diagnosis not present

## 2017-04-09 DIAGNOSIS — C7989 Secondary malignant neoplasm of other specified sites: Secondary | ICD-10-CM | POA: Diagnosis not present

## 2017-04-09 DIAGNOSIS — R1310 Dysphagia, unspecified: Secondary | ICD-10-CM | POA: Diagnosis not present

## 2017-04-09 DIAGNOSIS — G518 Other disorders of facial nerve: Secondary | ICD-10-CM | POA: Diagnosis not present

## 2017-04-09 DIAGNOSIS — J439 Emphysema, unspecified: Secondary | ICD-10-CM | POA: Diagnosis not present

## 2017-04-09 DIAGNOSIS — C07 Malignant neoplasm of parotid gland: Secondary | ICD-10-CM | POA: Diagnosis not present

## 2017-04-09 DIAGNOSIS — R64 Cachexia: Secondary | ICD-10-CM | POA: Diagnosis not present

## 2017-04-13 DIAGNOSIS — J439 Emphysema, unspecified: Secondary | ICD-10-CM | POA: Diagnosis not present

## 2017-04-13 DIAGNOSIS — C7989 Secondary malignant neoplasm of other specified sites: Secondary | ICD-10-CM | POA: Diagnosis not present

## 2017-04-13 DIAGNOSIS — C07 Malignant neoplasm of parotid gland: Secondary | ICD-10-CM | POA: Diagnosis not present

## 2017-04-13 DIAGNOSIS — R64 Cachexia: Secondary | ICD-10-CM | POA: Diagnosis not present

## 2017-04-13 DIAGNOSIS — G518 Other disorders of facial nerve: Secondary | ICD-10-CM | POA: Diagnosis not present

## 2017-04-13 DIAGNOSIS — R1310 Dysphagia, unspecified: Secondary | ICD-10-CM | POA: Diagnosis not present

## 2017-04-15 DIAGNOSIS — C7989 Secondary malignant neoplasm of other specified sites: Secondary | ICD-10-CM | POA: Diagnosis not present

## 2017-04-15 DIAGNOSIS — C07 Malignant neoplasm of parotid gland: Secondary | ICD-10-CM | POA: Diagnosis not present

## 2017-04-15 DIAGNOSIS — R64 Cachexia: Secondary | ICD-10-CM | POA: Diagnosis not present

## 2017-04-15 DIAGNOSIS — J439 Emphysema, unspecified: Secondary | ICD-10-CM | POA: Diagnosis not present

## 2017-04-15 DIAGNOSIS — R1310 Dysphagia, unspecified: Secondary | ICD-10-CM | POA: Diagnosis not present

## 2017-04-15 DIAGNOSIS — G518 Other disorders of facial nerve: Secondary | ICD-10-CM | POA: Diagnosis not present

## 2017-04-16 DIAGNOSIS — C07 Malignant neoplasm of parotid gland: Secondary | ICD-10-CM | POA: Diagnosis not present

## 2017-04-16 DIAGNOSIS — R1310 Dysphagia, unspecified: Secondary | ICD-10-CM | POA: Diagnosis not present

## 2017-04-16 DIAGNOSIS — G518 Other disorders of facial nerve: Secondary | ICD-10-CM | POA: Diagnosis not present

## 2017-04-16 DIAGNOSIS — J439 Emphysema, unspecified: Secondary | ICD-10-CM | POA: Diagnosis not present

## 2017-04-16 DIAGNOSIS — R64 Cachexia: Secondary | ICD-10-CM | POA: Diagnosis not present

## 2017-04-16 DIAGNOSIS — C7989 Secondary malignant neoplasm of other specified sites: Secondary | ICD-10-CM | POA: Diagnosis not present

## 2017-04-23 DIAGNOSIS — R1310 Dysphagia, unspecified: Secondary | ICD-10-CM | POA: Diagnosis not present

## 2017-04-23 DIAGNOSIS — G518 Other disorders of facial nerve: Secondary | ICD-10-CM | POA: Diagnosis not present

## 2017-04-23 DIAGNOSIS — C7989 Secondary malignant neoplasm of other specified sites: Secondary | ICD-10-CM | POA: Diagnosis not present

## 2017-04-23 DIAGNOSIS — J439 Emphysema, unspecified: Secondary | ICD-10-CM | POA: Diagnosis not present

## 2017-04-23 DIAGNOSIS — C07 Malignant neoplasm of parotid gland: Secondary | ICD-10-CM | POA: Diagnosis not present

## 2017-04-23 DIAGNOSIS — R64 Cachexia: Secondary | ICD-10-CM | POA: Diagnosis not present

## 2017-04-30 DIAGNOSIS — I10 Essential (primary) hypertension: Secondary | ICD-10-CM | POA: Diagnosis not present

## 2017-04-30 DIAGNOSIS — C07 Malignant neoplasm of parotid gland: Secondary | ICD-10-CM | POA: Diagnosis not present

## 2017-04-30 DIAGNOSIS — J439 Emphysema, unspecified: Secondary | ICD-10-CM | POA: Diagnosis not present

## 2017-04-30 DIAGNOSIS — R1310 Dysphagia, unspecified: Secondary | ICD-10-CM | POA: Diagnosis not present

## 2017-04-30 DIAGNOSIS — C7989 Secondary malignant neoplasm of other specified sites: Secondary | ICD-10-CM | POA: Diagnosis not present

## 2017-04-30 DIAGNOSIS — Z9049 Acquired absence of other specified parts of digestive tract: Secondary | ICD-10-CM | POA: Diagnosis not present

## 2017-04-30 DIAGNOSIS — Z87891 Personal history of nicotine dependence: Secondary | ICD-10-CM | POA: Diagnosis not present

## 2017-04-30 DIAGNOSIS — Z9013 Acquired absence of bilateral breasts and nipples: Secondary | ICD-10-CM | POA: Diagnosis not present

## 2017-04-30 DIAGNOSIS — Z853 Personal history of malignant neoplasm of breast: Secondary | ICD-10-CM | POA: Diagnosis not present

## 2017-04-30 DIAGNOSIS — R64 Cachexia: Secondary | ICD-10-CM | POA: Diagnosis not present

## 2017-04-30 DIAGNOSIS — G518 Other disorders of facial nerve: Secondary | ICD-10-CM | POA: Diagnosis not present

## 2017-04-30 DIAGNOSIS — Z85038 Personal history of other malignant neoplasm of large intestine: Secondary | ICD-10-CM | POA: Diagnosis not present

## 2017-05-02 DIAGNOSIS — C07 Malignant neoplasm of parotid gland: Secondary | ICD-10-CM | POA: Diagnosis not present

## 2017-05-02 DIAGNOSIS — J439 Emphysema, unspecified: Secondary | ICD-10-CM | POA: Diagnosis not present

## 2017-05-02 DIAGNOSIS — G518 Other disorders of facial nerve: Secondary | ICD-10-CM | POA: Diagnosis not present

## 2017-05-02 DIAGNOSIS — R64 Cachexia: Secondary | ICD-10-CM | POA: Diagnosis not present

## 2017-05-02 DIAGNOSIS — C7989 Secondary malignant neoplasm of other specified sites: Secondary | ICD-10-CM | POA: Diagnosis not present

## 2017-05-02 DIAGNOSIS — R1310 Dysphagia, unspecified: Secondary | ICD-10-CM | POA: Diagnosis not present

## 2017-05-06 DIAGNOSIS — J439 Emphysema, unspecified: Secondary | ICD-10-CM | POA: Diagnosis not present

## 2017-05-06 DIAGNOSIS — G518 Other disorders of facial nerve: Secondary | ICD-10-CM | POA: Diagnosis not present

## 2017-05-06 DIAGNOSIS — R1310 Dysphagia, unspecified: Secondary | ICD-10-CM | POA: Diagnosis not present

## 2017-05-06 DIAGNOSIS — R64 Cachexia: Secondary | ICD-10-CM | POA: Diagnosis not present

## 2017-05-06 DIAGNOSIS — C7989 Secondary malignant neoplasm of other specified sites: Secondary | ICD-10-CM | POA: Diagnosis not present

## 2017-05-06 DIAGNOSIS — C07 Malignant neoplasm of parotid gland: Secondary | ICD-10-CM | POA: Diagnosis not present

## 2017-05-13 DIAGNOSIS — G518 Other disorders of facial nerve: Secondary | ICD-10-CM | POA: Diagnosis not present

## 2017-05-13 DIAGNOSIS — R1310 Dysphagia, unspecified: Secondary | ICD-10-CM | POA: Diagnosis not present

## 2017-05-13 DIAGNOSIS — J439 Emphysema, unspecified: Secondary | ICD-10-CM | POA: Diagnosis not present

## 2017-05-13 DIAGNOSIS — R64 Cachexia: Secondary | ICD-10-CM | POA: Diagnosis not present

## 2017-05-13 DIAGNOSIS — C07 Malignant neoplasm of parotid gland: Secondary | ICD-10-CM | POA: Diagnosis not present

## 2017-05-13 DIAGNOSIS — C7989 Secondary malignant neoplasm of other specified sites: Secondary | ICD-10-CM | POA: Diagnosis not present

## 2017-05-18 DIAGNOSIS — R1310 Dysphagia, unspecified: Secondary | ICD-10-CM | POA: Diagnosis not present

## 2017-05-18 DIAGNOSIS — G518 Other disorders of facial nerve: Secondary | ICD-10-CM | POA: Diagnosis not present

## 2017-05-18 DIAGNOSIS — J439 Emphysema, unspecified: Secondary | ICD-10-CM | POA: Diagnosis not present

## 2017-05-18 DIAGNOSIS — C7989 Secondary malignant neoplasm of other specified sites: Secondary | ICD-10-CM | POA: Diagnosis not present

## 2017-05-18 DIAGNOSIS — C07 Malignant neoplasm of parotid gland: Secondary | ICD-10-CM | POA: Diagnosis not present

## 2017-05-18 DIAGNOSIS — R64 Cachexia: Secondary | ICD-10-CM | POA: Diagnosis not present

## 2017-05-19 DIAGNOSIS — G518 Other disorders of facial nerve: Secondary | ICD-10-CM | POA: Diagnosis not present

## 2017-05-19 DIAGNOSIS — R1310 Dysphagia, unspecified: Secondary | ICD-10-CM | POA: Diagnosis not present

## 2017-05-19 DIAGNOSIS — R64 Cachexia: Secondary | ICD-10-CM | POA: Diagnosis not present

## 2017-05-19 DIAGNOSIS — C7989 Secondary malignant neoplasm of other specified sites: Secondary | ICD-10-CM | POA: Diagnosis not present

## 2017-05-19 DIAGNOSIS — J439 Emphysema, unspecified: Secondary | ICD-10-CM | POA: Diagnosis not present

## 2017-05-19 DIAGNOSIS — C07 Malignant neoplasm of parotid gland: Secondary | ICD-10-CM | POA: Diagnosis not present

## 2017-05-20 DIAGNOSIS — G518 Other disorders of facial nerve: Secondary | ICD-10-CM | POA: Diagnosis not present

## 2017-05-20 DIAGNOSIS — R64 Cachexia: Secondary | ICD-10-CM | POA: Diagnosis not present

## 2017-05-20 DIAGNOSIS — J439 Emphysema, unspecified: Secondary | ICD-10-CM | POA: Diagnosis not present

## 2017-05-20 DIAGNOSIS — C07 Malignant neoplasm of parotid gland: Secondary | ICD-10-CM | POA: Diagnosis not present

## 2017-05-20 DIAGNOSIS — R1310 Dysphagia, unspecified: Secondary | ICD-10-CM | POA: Diagnosis not present

## 2017-05-20 DIAGNOSIS — C7989 Secondary malignant neoplasm of other specified sites: Secondary | ICD-10-CM | POA: Diagnosis not present

## 2017-05-27 DIAGNOSIS — J439 Emphysema, unspecified: Secondary | ICD-10-CM | POA: Diagnosis not present

## 2017-05-27 DIAGNOSIS — R1310 Dysphagia, unspecified: Secondary | ICD-10-CM | POA: Diagnosis not present

## 2017-05-27 DIAGNOSIS — C7989 Secondary malignant neoplasm of other specified sites: Secondary | ICD-10-CM | POA: Diagnosis not present

## 2017-05-27 DIAGNOSIS — C07 Malignant neoplasm of parotid gland: Secondary | ICD-10-CM | POA: Diagnosis not present

## 2017-05-27 DIAGNOSIS — G518 Other disorders of facial nerve: Secondary | ICD-10-CM | POA: Diagnosis not present

## 2017-05-27 DIAGNOSIS — R64 Cachexia: Secondary | ICD-10-CM | POA: Diagnosis not present

## 2017-05-30 DIAGNOSIS — Z9013 Acquired absence of bilateral breasts and nipples: Secondary | ICD-10-CM | POA: Diagnosis not present

## 2017-05-30 DIAGNOSIS — C07 Malignant neoplasm of parotid gland: Secondary | ICD-10-CM | POA: Diagnosis not present

## 2017-05-30 DIAGNOSIS — Z9049 Acquired absence of other specified parts of digestive tract: Secondary | ICD-10-CM | POA: Diagnosis not present

## 2017-05-30 DIAGNOSIS — R64 Cachexia: Secondary | ICD-10-CM | POA: Diagnosis not present

## 2017-05-30 DIAGNOSIS — J439 Emphysema, unspecified: Secondary | ICD-10-CM | POA: Diagnosis not present

## 2017-05-30 DIAGNOSIS — C7989 Secondary malignant neoplasm of other specified sites: Secondary | ICD-10-CM | POA: Diagnosis not present

## 2017-05-30 DIAGNOSIS — Z9981 Dependence on supplemental oxygen: Secondary | ICD-10-CM | POA: Diagnosis not present

## 2017-05-30 DIAGNOSIS — R1310 Dysphagia, unspecified: Secondary | ICD-10-CM | POA: Diagnosis not present

## 2017-05-30 DIAGNOSIS — Z85038 Personal history of other malignant neoplasm of large intestine: Secondary | ICD-10-CM | POA: Diagnosis not present

## 2017-05-30 DIAGNOSIS — G518 Other disorders of facial nerve: Secondary | ICD-10-CM | POA: Diagnosis not present

## 2017-05-30 DIAGNOSIS — Z87891 Personal history of nicotine dependence: Secondary | ICD-10-CM | POA: Diagnosis not present

## 2017-05-30 DIAGNOSIS — I10 Essential (primary) hypertension: Secondary | ICD-10-CM | POA: Diagnosis not present

## 2017-05-30 DIAGNOSIS — Z853 Personal history of malignant neoplasm of breast: Secondary | ICD-10-CM | POA: Diagnosis not present

## 2017-06-03 DIAGNOSIS — G518 Other disorders of facial nerve: Secondary | ICD-10-CM | POA: Diagnosis not present

## 2017-06-03 DIAGNOSIS — C07 Malignant neoplasm of parotid gland: Secondary | ICD-10-CM | POA: Diagnosis not present

## 2017-06-03 DIAGNOSIS — C7989 Secondary malignant neoplasm of other specified sites: Secondary | ICD-10-CM | POA: Diagnosis not present

## 2017-06-03 DIAGNOSIS — Z9981 Dependence on supplemental oxygen: Secondary | ICD-10-CM | POA: Diagnosis not present

## 2017-06-03 DIAGNOSIS — R64 Cachexia: Secondary | ICD-10-CM | POA: Diagnosis not present

## 2017-06-03 DIAGNOSIS — R1310 Dysphagia, unspecified: Secondary | ICD-10-CM | POA: Diagnosis not present

## 2017-06-11 DIAGNOSIS — C7989 Secondary malignant neoplasm of other specified sites: Secondary | ICD-10-CM | POA: Diagnosis not present

## 2017-06-11 DIAGNOSIS — R1310 Dysphagia, unspecified: Secondary | ICD-10-CM | POA: Diagnosis not present

## 2017-06-11 DIAGNOSIS — R64 Cachexia: Secondary | ICD-10-CM | POA: Diagnosis not present

## 2017-06-11 DIAGNOSIS — C07 Malignant neoplasm of parotid gland: Secondary | ICD-10-CM | POA: Diagnosis not present

## 2017-06-11 DIAGNOSIS — Z9981 Dependence on supplemental oxygen: Secondary | ICD-10-CM | POA: Diagnosis not present

## 2017-06-11 DIAGNOSIS — G518 Other disorders of facial nerve: Secondary | ICD-10-CM | POA: Diagnosis not present

## 2017-06-17 DIAGNOSIS — G518 Other disorders of facial nerve: Secondary | ICD-10-CM | POA: Diagnosis not present

## 2017-06-17 DIAGNOSIS — C7989 Secondary malignant neoplasm of other specified sites: Secondary | ICD-10-CM | POA: Diagnosis not present

## 2017-06-17 DIAGNOSIS — C07 Malignant neoplasm of parotid gland: Secondary | ICD-10-CM | POA: Diagnosis not present

## 2017-06-17 DIAGNOSIS — R64 Cachexia: Secondary | ICD-10-CM | POA: Diagnosis not present

## 2017-06-17 DIAGNOSIS — Z9981 Dependence on supplemental oxygen: Secondary | ICD-10-CM | POA: Diagnosis not present

## 2017-06-17 DIAGNOSIS — R1310 Dysphagia, unspecified: Secondary | ICD-10-CM | POA: Diagnosis not present

## 2017-06-18 DIAGNOSIS — C7989 Secondary malignant neoplasm of other specified sites: Secondary | ICD-10-CM | POA: Diagnosis not present

## 2017-06-18 DIAGNOSIS — C07 Malignant neoplasm of parotid gland: Secondary | ICD-10-CM | POA: Diagnosis not present

## 2017-06-18 DIAGNOSIS — G518 Other disorders of facial nerve: Secondary | ICD-10-CM | POA: Diagnosis not present

## 2017-06-18 DIAGNOSIS — R64 Cachexia: Secondary | ICD-10-CM | POA: Diagnosis not present

## 2017-06-18 DIAGNOSIS — Z9981 Dependence on supplemental oxygen: Secondary | ICD-10-CM | POA: Diagnosis not present

## 2017-06-18 DIAGNOSIS — R1310 Dysphagia, unspecified: Secondary | ICD-10-CM | POA: Diagnosis not present

## 2017-06-26 DIAGNOSIS — R1310 Dysphagia, unspecified: Secondary | ICD-10-CM | POA: Diagnosis not present

## 2017-06-26 DIAGNOSIS — C7989 Secondary malignant neoplasm of other specified sites: Secondary | ICD-10-CM | POA: Diagnosis not present

## 2017-06-26 DIAGNOSIS — C07 Malignant neoplasm of parotid gland: Secondary | ICD-10-CM | POA: Diagnosis not present

## 2017-06-26 DIAGNOSIS — Z9981 Dependence on supplemental oxygen: Secondary | ICD-10-CM | POA: Diagnosis not present

## 2017-06-26 DIAGNOSIS — G518 Other disorders of facial nerve: Secondary | ICD-10-CM | POA: Diagnosis not present

## 2017-06-26 DIAGNOSIS — R64 Cachexia: Secondary | ICD-10-CM | POA: Diagnosis not present

## 2017-06-30 DIAGNOSIS — J439 Emphysema, unspecified: Secondary | ICD-10-CM | POA: Diagnosis not present

## 2017-06-30 DIAGNOSIS — C7989 Secondary malignant neoplasm of other specified sites: Secondary | ICD-10-CM | POA: Diagnosis not present

## 2017-06-30 DIAGNOSIS — C07 Malignant neoplasm of parotid gland: Secondary | ICD-10-CM | POA: Diagnosis not present

## 2017-06-30 DIAGNOSIS — Z853 Personal history of malignant neoplasm of breast: Secondary | ICD-10-CM | POA: Diagnosis not present

## 2017-06-30 DIAGNOSIS — G518 Other disorders of facial nerve: Secondary | ICD-10-CM | POA: Diagnosis not present

## 2017-06-30 DIAGNOSIS — R64 Cachexia: Secondary | ICD-10-CM | POA: Diagnosis not present

## 2017-06-30 DIAGNOSIS — Z9981 Dependence on supplemental oxygen: Secondary | ICD-10-CM | POA: Diagnosis not present

## 2017-06-30 DIAGNOSIS — I10 Essential (primary) hypertension: Secondary | ICD-10-CM | POA: Diagnosis not present

## 2017-06-30 DIAGNOSIS — R1310 Dysphagia, unspecified: Secondary | ICD-10-CM | POA: Diagnosis not present

## 2017-06-30 DIAGNOSIS — Z9049 Acquired absence of other specified parts of digestive tract: Secondary | ICD-10-CM | POA: Diagnosis not present

## 2017-06-30 DIAGNOSIS — Z9013 Acquired absence of bilateral breasts and nipples: Secondary | ICD-10-CM | POA: Diagnosis not present

## 2017-06-30 DIAGNOSIS — Z87891 Personal history of nicotine dependence: Secondary | ICD-10-CM | POA: Diagnosis not present

## 2017-06-30 DIAGNOSIS — Z85038 Personal history of other malignant neoplasm of large intestine: Secondary | ICD-10-CM | POA: Diagnosis not present

## 2017-07-03 DIAGNOSIS — C07 Malignant neoplasm of parotid gland: Secondary | ICD-10-CM | POA: Diagnosis not present

## 2017-07-03 DIAGNOSIS — G518 Other disorders of facial nerve: Secondary | ICD-10-CM | POA: Diagnosis not present

## 2017-07-03 DIAGNOSIS — C7989 Secondary malignant neoplasm of other specified sites: Secondary | ICD-10-CM | POA: Diagnosis not present

## 2017-07-03 DIAGNOSIS — R64 Cachexia: Secondary | ICD-10-CM | POA: Diagnosis not present

## 2017-07-03 DIAGNOSIS — Z9981 Dependence on supplemental oxygen: Secondary | ICD-10-CM | POA: Diagnosis not present

## 2017-07-03 DIAGNOSIS — R1310 Dysphagia, unspecified: Secondary | ICD-10-CM | POA: Diagnosis not present

## 2017-07-04 DIAGNOSIS — R1310 Dysphagia, unspecified: Secondary | ICD-10-CM | POA: Diagnosis not present

## 2017-07-04 DIAGNOSIS — C07 Malignant neoplasm of parotid gland: Secondary | ICD-10-CM | POA: Diagnosis not present

## 2017-07-04 DIAGNOSIS — R64 Cachexia: Secondary | ICD-10-CM | POA: Diagnosis not present

## 2017-07-04 DIAGNOSIS — Z9981 Dependence on supplemental oxygen: Secondary | ICD-10-CM | POA: Diagnosis not present

## 2017-07-04 DIAGNOSIS — G518 Other disorders of facial nerve: Secondary | ICD-10-CM | POA: Diagnosis not present

## 2017-07-04 DIAGNOSIS — C7989 Secondary malignant neoplasm of other specified sites: Secondary | ICD-10-CM | POA: Diagnosis not present

## 2017-07-09 DIAGNOSIS — C7989 Secondary malignant neoplasm of other specified sites: Secondary | ICD-10-CM | POA: Diagnosis not present

## 2017-07-09 DIAGNOSIS — R1310 Dysphagia, unspecified: Secondary | ICD-10-CM | POA: Diagnosis not present

## 2017-07-09 DIAGNOSIS — C07 Malignant neoplasm of parotid gland: Secondary | ICD-10-CM | POA: Diagnosis not present

## 2017-07-09 DIAGNOSIS — G518 Other disorders of facial nerve: Secondary | ICD-10-CM | POA: Diagnosis not present

## 2017-07-09 DIAGNOSIS — Z9981 Dependence on supplemental oxygen: Secondary | ICD-10-CM | POA: Diagnosis not present

## 2017-07-09 DIAGNOSIS — R64 Cachexia: Secondary | ICD-10-CM | POA: Diagnosis not present

## 2017-07-11 DIAGNOSIS — R64 Cachexia: Secondary | ICD-10-CM | POA: Diagnosis not present

## 2017-07-11 DIAGNOSIS — R1310 Dysphagia, unspecified: Secondary | ICD-10-CM | POA: Diagnosis not present

## 2017-07-11 DIAGNOSIS — C7989 Secondary malignant neoplasm of other specified sites: Secondary | ICD-10-CM | POA: Diagnosis not present

## 2017-07-11 DIAGNOSIS — C07 Malignant neoplasm of parotid gland: Secondary | ICD-10-CM | POA: Diagnosis not present

## 2017-07-11 DIAGNOSIS — G518 Other disorders of facial nerve: Secondary | ICD-10-CM | POA: Diagnosis not present

## 2017-07-11 DIAGNOSIS — Z9981 Dependence on supplemental oxygen: Secondary | ICD-10-CM | POA: Diagnosis not present

## 2017-07-12 DIAGNOSIS — G518 Other disorders of facial nerve: Secondary | ICD-10-CM | POA: Diagnosis not present

## 2017-07-12 DIAGNOSIS — Z9981 Dependence on supplemental oxygen: Secondary | ICD-10-CM | POA: Diagnosis not present

## 2017-07-12 DIAGNOSIS — C07 Malignant neoplasm of parotid gland: Secondary | ICD-10-CM | POA: Diagnosis not present

## 2017-07-12 DIAGNOSIS — R1310 Dysphagia, unspecified: Secondary | ICD-10-CM | POA: Diagnosis not present

## 2017-07-12 DIAGNOSIS — C7989 Secondary malignant neoplasm of other specified sites: Secondary | ICD-10-CM | POA: Diagnosis not present

## 2017-07-12 DIAGNOSIS — R64 Cachexia: Secondary | ICD-10-CM | POA: Diagnosis not present

## 2017-07-17 DIAGNOSIS — G518 Other disorders of facial nerve: Secondary | ICD-10-CM | POA: Diagnosis not present

## 2017-07-17 DIAGNOSIS — Z9981 Dependence on supplemental oxygen: Secondary | ICD-10-CM | POA: Diagnosis not present

## 2017-07-17 DIAGNOSIS — C7989 Secondary malignant neoplasm of other specified sites: Secondary | ICD-10-CM | POA: Diagnosis not present

## 2017-07-17 DIAGNOSIS — R64 Cachexia: Secondary | ICD-10-CM | POA: Diagnosis not present

## 2017-07-17 DIAGNOSIS — R1310 Dysphagia, unspecified: Secondary | ICD-10-CM | POA: Diagnosis not present

## 2017-07-17 DIAGNOSIS — C07 Malignant neoplasm of parotid gland: Secondary | ICD-10-CM | POA: Diagnosis not present

## 2017-07-23 DIAGNOSIS — G518 Other disorders of facial nerve: Secondary | ICD-10-CM | POA: Diagnosis not present

## 2017-07-23 DIAGNOSIS — Z9981 Dependence on supplemental oxygen: Secondary | ICD-10-CM | POA: Diagnosis not present

## 2017-07-23 DIAGNOSIS — R1310 Dysphagia, unspecified: Secondary | ICD-10-CM | POA: Diagnosis not present

## 2017-07-23 DIAGNOSIS — R64 Cachexia: Secondary | ICD-10-CM | POA: Diagnosis not present

## 2017-07-23 DIAGNOSIS — C7989 Secondary malignant neoplasm of other specified sites: Secondary | ICD-10-CM | POA: Diagnosis not present

## 2017-07-23 DIAGNOSIS — C07 Malignant neoplasm of parotid gland: Secondary | ICD-10-CM | POA: Diagnosis not present

## 2017-07-30 DIAGNOSIS — Z9981 Dependence on supplemental oxygen: Secondary | ICD-10-CM | POA: Diagnosis not present

## 2017-07-30 DIAGNOSIS — R1310 Dysphagia, unspecified: Secondary | ICD-10-CM | POA: Diagnosis not present

## 2017-07-30 DIAGNOSIS — C7989 Secondary malignant neoplasm of other specified sites: Secondary | ICD-10-CM | POA: Diagnosis not present

## 2017-07-30 DIAGNOSIS — C07 Malignant neoplasm of parotid gland: Secondary | ICD-10-CM | POA: Diagnosis not present

## 2017-07-30 DIAGNOSIS — G518 Other disorders of facial nerve: Secondary | ICD-10-CM | POA: Diagnosis not present

## 2017-07-30 DIAGNOSIS — R64 Cachexia: Secondary | ICD-10-CM | POA: Diagnosis not present

## 2017-07-31 DIAGNOSIS — C7989 Secondary malignant neoplasm of other specified sites: Secondary | ICD-10-CM | POA: Diagnosis not present

## 2017-07-31 DIAGNOSIS — J439 Emphysema, unspecified: Secondary | ICD-10-CM | POA: Diagnosis not present

## 2017-07-31 DIAGNOSIS — Z853 Personal history of malignant neoplasm of breast: Secondary | ICD-10-CM | POA: Diagnosis not present

## 2017-07-31 DIAGNOSIS — Z9013 Acquired absence of bilateral breasts and nipples: Secondary | ICD-10-CM | POA: Diagnosis not present

## 2017-07-31 DIAGNOSIS — R1310 Dysphagia, unspecified: Secondary | ICD-10-CM | POA: Diagnosis not present

## 2017-07-31 DIAGNOSIS — Z87891 Personal history of nicotine dependence: Secondary | ICD-10-CM | POA: Diagnosis not present

## 2017-07-31 DIAGNOSIS — Z85038 Personal history of other malignant neoplasm of large intestine: Secondary | ICD-10-CM | POA: Diagnosis not present

## 2017-07-31 DIAGNOSIS — G518 Other disorders of facial nerve: Secondary | ICD-10-CM | POA: Diagnosis not present

## 2017-07-31 DIAGNOSIS — R64 Cachexia: Secondary | ICD-10-CM | POA: Diagnosis not present

## 2017-07-31 DIAGNOSIS — Z9981 Dependence on supplemental oxygen: Secondary | ICD-10-CM | POA: Diagnosis not present

## 2017-07-31 DIAGNOSIS — Z9049 Acquired absence of other specified parts of digestive tract: Secondary | ICD-10-CM | POA: Diagnosis not present

## 2017-07-31 DIAGNOSIS — I10 Essential (primary) hypertension: Secondary | ICD-10-CM | POA: Diagnosis not present

## 2017-07-31 DIAGNOSIS — C07 Malignant neoplasm of parotid gland: Secondary | ICD-10-CM | POA: Diagnosis not present

## 2017-08-06 DIAGNOSIS — G518 Other disorders of facial nerve: Secondary | ICD-10-CM | POA: Diagnosis not present

## 2017-08-06 DIAGNOSIS — R64 Cachexia: Secondary | ICD-10-CM | POA: Diagnosis not present

## 2017-08-06 DIAGNOSIS — Z9981 Dependence on supplemental oxygen: Secondary | ICD-10-CM | POA: Diagnosis not present

## 2017-08-06 DIAGNOSIS — C07 Malignant neoplasm of parotid gland: Secondary | ICD-10-CM | POA: Diagnosis not present

## 2017-08-06 DIAGNOSIS — R1310 Dysphagia, unspecified: Secondary | ICD-10-CM | POA: Diagnosis not present

## 2017-08-06 DIAGNOSIS — C7989 Secondary malignant neoplasm of other specified sites: Secondary | ICD-10-CM | POA: Diagnosis not present

## 2017-08-13 DIAGNOSIS — G518 Other disorders of facial nerve: Secondary | ICD-10-CM | POA: Diagnosis not present

## 2017-08-13 DIAGNOSIS — Z9981 Dependence on supplemental oxygen: Secondary | ICD-10-CM | POA: Diagnosis not present

## 2017-08-13 DIAGNOSIS — R1310 Dysphagia, unspecified: Secondary | ICD-10-CM | POA: Diagnosis not present

## 2017-08-13 DIAGNOSIS — R64 Cachexia: Secondary | ICD-10-CM | POA: Diagnosis not present

## 2017-08-13 DIAGNOSIS — C7989 Secondary malignant neoplasm of other specified sites: Secondary | ICD-10-CM | POA: Diagnosis not present

## 2017-08-13 DIAGNOSIS — C07 Malignant neoplasm of parotid gland: Secondary | ICD-10-CM | POA: Diagnosis not present

## 2017-08-14 DIAGNOSIS — C07 Malignant neoplasm of parotid gland: Secondary | ICD-10-CM | POA: Diagnosis not present

## 2017-08-14 DIAGNOSIS — R64 Cachexia: Secondary | ICD-10-CM | POA: Diagnosis not present

## 2017-08-14 DIAGNOSIS — G518 Other disorders of facial nerve: Secondary | ICD-10-CM | POA: Diagnosis not present

## 2017-08-14 DIAGNOSIS — C7989 Secondary malignant neoplasm of other specified sites: Secondary | ICD-10-CM | POA: Diagnosis not present

## 2017-08-14 DIAGNOSIS — Z9981 Dependence on supplemental oxygen: Secondary | ICD-10-CM | POA: Diagnosis not present

## 2017-08-14 DIAGNOSIS — R1310 Dysphagia, unspecified: Secondary | ICD-10-CM | POA: Diagnosis not present

## 2017-08-20 DIAGNOSIS — R64 Cachexia: Secondary | ICD-10-CM | POA: Diagnosis not present

## 2017-08-20 DIAGNOSIS — C7989 Secondary malignant neoplasm of other specified sites: Secondary | ICD-10-CM | POA: Diagnosis not present

## 2017-08-20 DIAGNOSIS — Z9981 Dependence on supplemental oxygen: Secondary | ICD-10-CM | POA: Diagnosis not present

## 2017-08-20 DIAGNOSIS — R1310 Dysphagia, unspecified: Secondary | ICD-10-CM | POA: Diagnosis not present

## 2017-08-20 DIAGNOSIS — C07 Malignant neoplasm of parotid gland: Secondary | ICD-10-CM | POA: Diagnosis not present

## 2017-08-20 DIAGNOSIS — G518 Other disorders of facial nerve: Secondary | ICD-10-CM | POA: Diagnosis not present

## 2017-08-27 DIAGNOSIS — G518 Other disorders of facial nerve: Secondary | ICD-10-CM | POA: Diagnosis not present

## 2017-08-27 DIAGNOSIS — R1310 Dysphagia, unspecified: Secondary | ICD-10-CM | POA: Diagnosis not present

## 2017-08-27 DIAGNOSIS — C07 Malignant neoplasm of parotid gland: Secondary | ICD-10-CM | POA: Diagnosis not present

## 2017-08-27 DIAGNOSIS — C7989 Secondary malignant neoplasm of other specified sites: Secondary | ICD-10-CM | POA: Diagnosis not present

## 2017-08-27 DIAGNOSIS — R64 Cachexia: Secondary | ICD-10-CM | POA: Diagnosis not present

## 2017-08-27 DIAGNOSIS — Z9981 Dependence on supplemental oxygen: Secondary | ICD-10-CM | POA: Diagnosis not present

## 2017-08-28 DIAGNOSIS — J439 Emphysema, unspecified: Secondary | ICD-10-CM | POA: Diagnosis not present

## 2017-08-28 DIAGNOSIS — Z85038 Personal history of other malignant neoplasm of large intestine: Secondary | ICD-10-CM | POA: Diagnosis not present

## 2017-08-28 DIAGNOSIS — R1310 Dysphagia, unspecified: Secondary | ICD-10-CM | POA: Diagnosis not present

## 2017-08-28 DIAGNOSIS — C7989 Secondary malignant neoplasm of other specified sites: Secondary | ICD-10-CM | POA: Diagnosis not present

## 2017-08-28 DIAGNOSIS — C07 Malignant neoplasm of parotid gland: Secondary | ICD-10-CM | POA: Diagnosis not present

## 2017-08-28 DIAGNOSIS — Z9049 Acquired absence of other specified parts of digestive tract: Secondary | ICD-10-CM | POA: Diagnosis not present

## 2017-08-28 DIAGNOSIS — Z9013 Acquired absence of bilateral breasts and nipples: Secondary | ICD-10-CM | POA: Diagnosis not present

## 2017-08-28 DIAGNOSIS — Z87891 Personal history of nicotine dependence: Secondary | ICD-10-CM | POA: Diagnosis not present

## 2017-08-28 DIAGNOSIS — I10 Essential (primary) hypertension: Secondary | ICD-10-CM | POA: Diagnosis not present

## 2017-08-28 DIAGNOSIS — G518 Other disorders of facial nerve: Secondary | ICD-10-CM | POA: Diagnosis not present

## 2017-08-28 DIAGNOSIS — Z853 Personal history of malignant neoplasm of breast: Secondary | ICD-10-CM | POA: Diagnosis not present

## 2017-08-28 DIAGNOSIS — R64 Cachexia: Secondary | ICD-10-CM | POA: Diagnosis not present

## 2017-08-28 DIAGNOSIS — Z9981 Dependence on supplemental oxygen: Secondary | ICD-10-CM | POA: Diagnosis not present

## 2017-08-29 DIAGNOSIS — C7989 Secondary malignant neoplasm of other specified sites: Secondary | ICD-10-CM | POA: Diagnosis not present

## 2017-08-29 DIAGNOSIS — G518 Other disorders of facial nerve: Secondary | ICD-10-CM | POA: Diagnosis not present

## 2017-08-29 DIAGNOSIS — R1310 Dysphagia, unspecified: Secondary | ICD-10-CM | POA: Diagnosis not present

## 2017-08-29 DIAGNOSIS — C07 Malignant neoplasm of parotid gland: Secondary | ICD-10-CM | POA: Diagnosis not present

## 2017-08-29 DIAGNOSIS — Z9981 Dependence on supplemental oxygen: Secondary | ICD-10-CM | POA: Diagnosis not present

## 2017-08-29 DIAGNOSIS — R64 Cachexia: Secondary | ICD-10-CM | POA: Diagnosis not present

## 2017-09-02 DIAGNOSIS — R1310 Dysphagia, unspecified: Secondary | ICD-10-CM | POA: Diagnosis not present

## 2017-09-02 DIAGNOSIS — C07 Malignant neoplasm of parotid gland: Secondary | ICD-10-CM | POA: Diagnosis not present

## 2017-09-02 DIAGNOSIS — C7989 Secondary malignant neoplasm of other specified sites: Secondary | ICD-10-CM | POA: Diagnosis not present

## 2017-09-02 DIAGNOSIS — Z9981 Dependence on supplemental oxygen: Secondary | ICD-10-CM | POA: Diagnosis not present

## 2017-09-02 DIAGNOSIS — G518 Other disorders of facial nerve: Secondary | ICD-10-CM | POA: Diagnosis not present

## 2017-09-02 DIAGNOSIS — R64 Cachexia: Secondary | ICD-10-CM | POA: Diagnosis not present

## 2017-09-09 DIAGNOSIS — R1310 Dysphagia, unspecified: Secondary | ICD-10-CM | POA: Diagnosis not present

## 2017-09-09 DIAGNOSIS — C7989 Secondary malignant neoplasm of other specified sites: Secondary | ICD-10-CM | POA: Diagnosis not present

## 2017-09-09 DIAGNOSIS — Z9981 Dependence on supplemental oxygen: Secondary | ICD-10-CM | POA: Diagnosis not present

## 2017-09-09 DIAGNOSIS — G518 Other disorders of facial nerve: Secondary | ICD-10-CM | POA: Diagnosis not present

## 2017-09-09 DIAGNOSIS — R64 Cachexia: Secondary | ICD-10-CM | POA: Diagnosis not present

## 2017-09-09 DIAGNOSIS — C07 Malignant neoplasm of parotid gland: Secondary | ICD-10-CM | POA: Diagnosis not present

## 2017-09-10 DIAGNOSIS — C07 Malignant neoplasm of parotid gland: Secondary | ICD-10-CM | POA: Diagnosis not present

## 2017-09-10 DIAGNOSIS — R64 Cachexia: Secondary | ICD-10-CM | POA: Diagnosis not present

## 2017-09-10 DIAGNOSIS — Z9981 Dependence on supplemental oxygen: Secondary | ICD-10-CM | POA: Diagnosis not present

## 2017-09-10 DIAGNOSIS — G518 Other disorders of facial nerve: Secondary | ICD-10-CM | POA: Diagnosis not present

## 2017-09-10 DIAGNOSIS — C7989 Secondary malignant neoplasm of other specified sites: Secondary | ICD-10-CM | POA: Diagnosis not present

## 2017-09-10 DIAGNOSIS — R1310 Dysphagia, unspecified: Secondary | ICD-10-CM | POA: Diagnosis not present

## 2017-09-16 DIAGNOSIS — C07 Malignant neoplasm of parotid gland: Secondary | ICD-10-CM | POA: Diagnosis not present

## 2017-09-16 DIAGNOSIS — R1310 Dysphagia, unspecified: Secondary | ICD-10-CM | POA: Diagnosis not present

## 2017-09-16 DIAGNOSIS — G518 Other disorders of facial nerve: Secondary | ICD-10-CM | POA: Diagnosis not present

## 2017-09-16 DIAGNOSIS — R64 Cachexia: Secondary | ICD-10-CM | POA: Diagnosis not present

## 2017-09-16 DIAGNOSIS — Z9981 Dependence on supplemental oxygen: Secondary | ICD-10-CM | POA: Diagnosis not present

## 2017-09-16 DIAGNOSIS — C7989 Secondary malignant neoplasm of other specified sites: Secondary | ICD-10-CM | POA: Diagnosis not present

## 2017-09-22 DIAGNOSIS — G518 Other disorders of facial nerve: Secondary | ICD-10-CM | POA: Diagnosis not present

## 2017-09-22 DIAGNOSIS — Z9981 Dependence on supplemental oxygen: Secondary | ICD-10-CM | POA: Diagnosis not present

## 2017-09-22 DIAGNOSIS — C07 Malignant neoplasm of parotid gland: Secondary | ICD-10-CM | POA: Diagnosis not present

## 2017-09-22 DIAGNOSIS — R64 Cachexia: Secondary | ICD-10-CM | POA: Diagnosis not present

## 2017-09-22 DIAGNOSIS — R1310 Dysphagia, unspecified: Secondary | ICD-10-CM | POA: Diagnosis not present

## 2017-09-22 DIAGNOSIS — C7989 Secondary malignant neoplasm of other specified sites: Secondary | ICD-10-CM | POA: Diagnosis not present

## 2017-09-28 DIAGNOSIS — C7989 Secondary malignant neoplasm of other specified sites: Secondary | ICD-10-CM | POA: Diagnosis not present

## 2017-09-28 DIAGNOSIS — Z9981 Dependence on supplemental oxygen: Secondary | ICD-10-CM | POA: Diagnosis not present

## 2017-09-28 DIAGNOSIS — Z85038 Personal history of other malignant neoplasm of large intestine: Secondary | ICD-10-CM | POA: Diagnosis not present

## 2017-09-28 DIAGNOSIS — Z9013 Acquired absence of bilateral breasts and nipples: Secondary | ICD-10-CM | POA: Diagnosis not present

## 2017-09-28 DIAGNOSIS — G518 Other disorders of facial nerve: Secondary | ICD-10-CM | POA: Diagnosis not present

## 2017-09-28 DIAGNOSIS — R64 Cachexia: Secondary | ICD-10-CM | POA: Diagnosis not present

## 2017-09-28 DIAGNOSIS — Z9049 Acquired absence of other specified parts of digestive tract: Secondary | ICD-10-CM | POA: Diagnosis not present

## 2017-09-28 DIAGNOSIS — J439 Emphysema, unspecified: Secondary | ICD-10-CM | POA: Diagnosis not present

## 2017-09-28 DIAGNOSIS — C07 Malignant neoplasm of parotid gland: Secondary | ICD-10-CM | POA: Diagnosis not present

## 2017-09-28 DIAGNOSIS — Z853 Personal history of malignant neoplasm of breast: Secondary | ICD-10-CM | POA: Diagnosis not present

## 2017-09-28 DIAGNOSIS — I10 Essential (primary) hypertension: Secondary | ICD-10-CM | POA: Diagnosis not present

## 2017-09-28 DIAGNOSIS — Z87891 Personal history of nicotine dependence: Secondary | ICD-10-CM | POA: Diagnosis not present

## 2017-09-28 DIAGNOSIS — R1310 Dysphagia, unspecified: Secondary | ICD-10-CM | POA: Diagnosis not present

## 2017-10-01 DIAGNOSIS — Z9981 Dependence on supplemental oxygen: Secondary | ICD-10-CM | POA: Diagnosis not present

## 2017-10-01 DIAGNOSIS — R64 Cachexia: Secondary | ICD-10-CM | POA: Diagnosis not present

## 2017-10-01 DIAGNOSIS — C7989 Secondary malignant neoplasm of other specified sites: Secondary | ICD-10-CM | POA: Diagnosis not present

## 2017-10-01 DIAGNOSIS — R1310 Dysphagia, unspecified: Secondary | ICD-10-CM | POA: Diagnosis not present

## 2017-10-01 DIAGNOSIS — C07 Malignant neoplasm of parotid gland: Secondary | ICD-10-CM | POA: Diagnosis not present

## 2017-10-01 DIAGNOSIS — G518 Other disorders of facial nerve: Secondary | ICD-10-CM | POA: Diagnosis not present

## 2017-10-08 DIAGNOSIS — R64 Cachexia: Secondary | ICD-10-CM | POA: Diagnosis not present

## 2017-10-08 DIAGNOSIS — C7989 Secondary malignant neoplasm of other specified sites: Secondary | ICD-10-CM | POA: Diagnosis not present

## 2017-10-08 DIAGNOSIS — G518 Other disorders of facial nerve: Secondary | ICD-10-CM | POA: Diagnosis not present

## 2017-10-08 DIAGNOSIS — R1310 Dysphagia, unspecified: Secondary | ICD-10-CM | POA: Diagnosis not present

## 2017-10-08 DIAGNOSIS — C07 Malignant neoplasm of parotid gland: Secondary | ICD-10-CM | POA: Diagnosis not present

## 2017-10-08 DIAGNOSIS — Z9981 Dependence on supplemental oxygen: Secondary | ICD-10-CM | POA: Diagnosis not present

## 2017-10-15 DIAGNOSIS — C7989 Secondary malignant neoplasm of other specified sites: Secondary | ICD-10-CM | POA: Diagnosis not present

## 2017-10-15 DIAGNOSIS — Z9981 Dependence on supplemental oxygen: Secondary | ICD-10-CM | POA: Diagnosis not present

## 2017-10-15 DIAGNOSIS — R1310 Dysphagia, unspecified: Secondary | ICD-10-CM | POA: Diagnosis not present

## 2017-10-15 DIAGNOSIS — R64 Cachexia: Secondary | ICD-10-CM | POA: Diagnosis not present

## 2017-10-15 DIAGNOSIS — G518 Other disorders of facial nerve: Secondary | ICD-10-CM | POA: Diagnosis not present

## 2017-10-15 DIAGNOSIS — C07 Malignant neoplasm of parotid gland: Secondary | ICD-10-CM | POA: Diagnosis not present

## 2017-10-22 DIAGNOSIS — C07 Malignant neoplasm of parotid gland: Secondary | ICD-10-CM | POA: Diagnosis not present

## 2017-10-22 DIAGNOSIS — R1310 Dysphagia, unspecified: Secondary | ICD-10-CM | POA: Diagnosis not present

## 2017-10-22 DIAGNOSIS — R64 Cachexia: Secondary | ICD-10-CM | POA: Diagnosis not present

## 2017-10-22 DIAGNOSIS — Z9981 Dependence on supplemental oxygen: Secondary | ICD-10-CM | POA: Diagnosis not present

## 2017-10-22 DIAGNOSIS — G518 Other disorders of facial nerve: Secondary | ICD-10-CM | POA: Diagnosis not present

## 2017-10-22 DIAGNOSIS — C7989 Secondary malignant neoplasm of other specified sites: Secondary | ICD-10-CM | POA: Diagnosis not present

## 2017-10-28 DIAGNOSIS — Z853 Personal history of malignant neoplasm of breast: Secondary | ICD-10-CM | POA: Diagnosis not present

## 2017-10-28 DIAGNOSIS — G518 Other disorders of facial nerve: Secondary | ICD-10-CM | POA: Diagnosis not present

## 2017-10-28 DIAGNOSIS — Z9049 Acquired absence of other specified parts of digestive tract: Secondary | ICD-10-CM | POA: Diagnosis not present

## 2017-10-28 DIAGNOSIS — Z87891 Personal history of nicotine dependence: Secondary | ICD-10-CM | POA: Diagnosis not present

## 2017-10-28 DIAGNOSIS — R1310 Dysphagia, unspecified: Secondary | ICD-10-CM | POA: Diagnosis not present

## 2017-10-28 DIAGNOSIS — Z9013 Acquired absence of bilateral breasts and nipples: Secondary | ICD-10-CM | POA: Diagnosis not present

## 2017-10-28 DIAGNOSIS — I10 Essential (primary) hypertension: Secondary | ICD-10-CM | POA: Diagnosis not present

## 2017-10-28 DIAGNOSIS — R64 Cachexia: Secondary | ICD-10-CM | POA: Diagnosis not present

## 2017-10-28 DIAGNOSIS — C7989 Secondary malignant neoplasm of other specified sites: Secondary | ICD-10-CM | POA: Diagnosis not present

## 2017-10-28 DIAGNOSIS — Z9981 Dependence on supplemental oxygen: Secondary | ICD-10-CM | POA: Diagnosis not present

## 2017-10-28 DIAGNOSIS — C07 Malignant neoplasm of parotid gland: Secondary | ICD-10-CM | POA: Diagnosis not present

## 2017-10-28 DIAGNOSIS — Z85038 Personal history of other malignant neoplasm of large intestine: Secondary | ICD-10-CM | POA: Diagnosis not present

## 2017-10-28 DIAGNOSIS — J439 Emphysema, unspecified: Secondary | ICD-10-CM | POA: Diagnosis not present

## 2017-10-29 DIAGNOSIS — G518 Other disorders of facial nerve: Secondary | ICD-10-CM | POA: Diagnosis not present

## 2017-10-29 DIAGNOSIS — C07 Malignant neoplasm of parotid gland: Secondary | ICD-10-CM | POA: Diagnosis not present

## 2017-10-29 DIAGNOSIS — C7989 Secondary malignant neoplasm of other specified sites: Secondary | ICD-10-CM | POA: Diagnosis not present

## 2017-10-29 DIAGNOSIS — Z9981 Dependence on supplemental oxygen: Secondary | ICD-10-CM | POA: Diagnosis not present

## 2017-10-29 DIAGNOSIS — R1310 Dysphagia, unspecified: Secondary | ICD-10-CM | POA: Diagnosis not present

## 2017-10-29 DIAGNOSIS — R64 Cachexia: Secondary | ICD-10-CM | POA: Diagnosis not present

## 2017-11-05 DIAGNOSIS — R1310 Dysphagia, unspecified: Secondary | ICD-10-CM | POA: Diagnosis not present

## 2017-11-05 DIAGNOSIS — R64 Cachexia: Secondary | ICD-10-CM | POA: Diagnosis not present

## 2017-11-05 DIAGNOSIS — C07 Malignant neoplasm of parotid gland: Secondary | ICD-10-CM | POA: Diagnosis not present

## 2017-11-05 DIAGNOSIS — G518 Other disorders of facial nerve: Secondary | ICD-10-CM | POA: Diagnosis not present

## 2017-11-05 DIAGNOSIS — Z9981 Dependence on supplemental oxygen: Secondary | ICD-10-CM | POA: Diagnosis not present

## 2017-11-05 DIAGNOSIS — C7989 Secondary malignant neoplasm of other specified sites: Secondary | ICD-10-CM | POA: Diagnosis not present

## 2017-11-06 DIAGNOSIS — R64 Cachexia: Secondary | ICD-10-CM | POA: Diagnosis not present

## 2017-11-06 DIAGNOSIS — C7989 Secondary malignant neoplasm of other specified sites: Secondary | ICD-10-CM | POA: Diagnosis not present

## 2017-11-06 DIAGNOSIS — Z9981 Dependence on supplemental oxygen: Secondary | ICD-10-CM | POA: Diagnosis not present

## 2017-11-06 DIAGNOSIS — G518 Other disorders of facial nerve: Secondary | ICD-10-CM | POA: Diagnosis not present

## 2017-11-06 DIAGNOSIS — R1310 Dysphagia, unspecified: Secondary | ICD-10-CM | POA: Diagnosis not present

## 2017-11-06 DIAGNOSIS — C07 Malignant neoplasm of parotid gland: Secondary | ICD-10-CM | POA: Diagnosis not present

## 2017-11-13 DIAGNOSIS — C7989 Secondary malignant neoplasm of other specified sites: Secondary | ICD-10-CM | POA: Diagnosis not present

## 2017-11-13 DIAGNOSIS — C07 Malignant neoplasm of parotid gland: Secondary | ICD-10-CM | POA: Diagnosis not present

## 2017-11-13 DIAGNOSIS — G518 Other disorders of facial nerve: Secondary | ICD-10-CM | POA: Diagnosis not present

## 2017-11-13 DIAGNOSIS — R1310 Dysphagia, unspecified: Secondary | ICD-10-CM | POA: Diagnosis not present

## 2017-11-13 DIAGNOSIS — R64 Cachexia: Secondary | ICD-10-CM | POA: Diagnosis not present

## 2017-11-13 DIAGNOSIS — Z9981 Dependence on supplemental oxygen: Secondary | ICD-10-CM | POA: Diagnosis not present

## 2017-11-19 DIAGNOSIS — G518 Other disorders of facial nerve: Secondary | ICD-10-CM | POA: Diagnosis not present

## 2017-11-19 DIAGNOSIS — Z9981 Dependence on supplemental oxygen: Secondary | ICD-10-CM | POA: Diagnosis not present

## 2017-11-19 DIAGNOSIS — R1310 Dysphagia, unspecified: Secondary | ICD-10-CM | POA: Diagnosis not present

## 2017-11-19 DIAGNOSIS — C7989 Secondary malignant neoplasm of other specified sites: Secondary | ICD-10-CM | POA: Diagnosis not present

## 2017-11-19 DIAGNOSIS — R64 Cachexia: Secondary | ICD-10-CM | POA: Diagnosis not present

## 2017-11-19 DIAGNOSIS — C07 Malignant neoplasm of parotid gland: Secondary | ICD-10-CM | POA: Diagnosis not present

## 2017-11-20 DIAGNOSIS — R64 Cachexia: Secondary | ICD-10-CM | POA: Diagnosis not present

## 2017-11-20 DIAGNOSIS — G518 Other disorders of facial nerve: Secondary | ICD-10-CM | POA: Diagnosis not present

## 2017-11-20 DIAGNOSIS — C07 Malignant neoplasm of parotid gland: Secondary | ICD-10-CM | POA: Diagnosis not present

## 2017-11-20 DIAGNOSIS — C7989 Secondary malignant neoplasm of other specified sites: Secondary | ICD-10-CM | POA: Diagnosis not present

## 2017-11-20 DIAGNOSIS — Z9981 Dependence on supplemental oxygen: Secondary | ICD-10-CM | POA: Diagnosis not present

## 2017-11-20 DIAGNOSIS — R1310 Dysphagia, unspecified: Secondary | ICD-10-CM | POA: Diagnosis not present

## 2017-11-23 DIAGNOSIS — R64 Cachexia: Secondary | ICD-10-CM | POA: Diagnosis not present

## 2017-11-23 DIAGNOSIS — Z9981 Dependence on supplemental oxygen: Secondary | ICD-10-CM | POA: Diagnosis not present

## 2017-11-23 DIAGNOSIS — R1310 Dysphagia, unspecified: Secondary | ICD-10-CM | POA: Diagnosis not present

## 2017-11-23 DIAGNOSIS — C07 Malignant neoplasm of parotid gland: Secondary | ICD-10-CM | POA: Diagnosis not present

## 2017-11-23 DIAGNOSIS — G518 Other disorders of facial nerve: Secondary | ICD-10-CM | POA: Diagnosis not present

## 2017-11-23 DIAGNOSIS — C7989 Secondary malignant neoplasm of other specified sites: Secondary | ICD-10-CM | POA: Diagnosis not present

## 2017-11-24 DIAGNOSIS — C7989 Secondary malignant neoplasm of other specified sites: Secondary | ICD-10-CM | POA: Diagnosis not present

## 2017-11-24 DIAGNOSIS — R64 Cachexia: Secondary | ICD-10-CM | POA: Diagnosis not present

## 2017-11-24 DIAGNOSIS — C07 Malignant neoplasm of parotid gland: Secondary | ICD-10-CM | POA: Diagnosis not present

## 2017-11-24 DIAGNOSIS — G518 Other disorders of facial nerve: Secondary | ICD-10-CM | POA: Diagnosis not present

## 2017-11-24 DIAGNOSIS — Z9981 Dependence on supplemental oxygen: Secondary | ICD-10-CM | POA: Diagnosis not present

## 2017-11-24 DIAGNOSIS — R1310 Dysphagia, unspecified: Secondary | ICD-10-CM | POA: Diagnosis not present

## 2017-11-26 DIAGNOSIS — Z9981 Dependence on supplemental oxygen: Secondary | ICD-10-CM | POA: Diagnosis not present

## 2017-11-26 DIAGNOSIS — R64 Cachexia: Secondary | ICD-10-CM | POA: Diagnosis not present

## 2017-11-26 DIAGNOSIS — C7989 Secondary malignant neoplasm of other specified sites: Secondary | ICD-10-CM | POA: Diagnosis not present

## 2017-11-26 DIAGNOSIS — G518 Other disorders of facial nerve: Secondary | ICD-10-CM | POA: Diagnosis not present

## 2017-11-26 DIAGNOSIS — C07 Malignant neoplasm of parotid gland: Secondary | ICD-10-CM | POA: Diagnosis not present

## 2017-11-26 DIAGNOSIS — R1310 Dysphagia, unspecified: Secondary | ICD-10-CM | POA: Diagnosis not present

## 2017-11-27 DIAGNOSIS — C07 Malignant neoplasm of parotid gland: Secondary | ICD-10-CM | POA: Diagnosis not present

## 2017-11-27 DIAGNOSIS — R64 Cachexia: Secondary | ICD-10-CM | POA: Diagnosis not present

## 2017-11-27 DIAGNOSIS — G518 Other disorders of facial nerve: Secondary | ICD-10-CM | POA: Diagnosis not present

## 2017-11-27 DIAGNOSIS — R1310 Dysphagia, unspecified: Secondary | ICD-10-CM | POA: Diagnosis not present

## 2017-11-27 DIAGNOSIS — C7989 Secondary malignant neoplasm of other specified sites: Secondary | ICD-10-CM | POA: Diagnosis not present

## 2017-11-27 DIAGNOSIS — Z9981 Dependence on supplemental oxygen: Secondary | ICD-10-CM | POA: Diagnosis not present

## 2017-11-28 DIAGNOSIS — R64 Cachexia: Secondary | ICD-10-CM | POA: Diagnosis not present

## 2017-11-28 DIAGNOSIS — R1310 Dysphagia, unspecified: Secondary | ICD-10-CM | POA: Diagnosis not present

## 2017-11-28 DIAGNOSIS — Z9981 Dependence on supplemental oxygen: Secondary | ICD-10-CM | POA: Diagnosis not present

## 2017-11-28 DIAGNOSIS — C07 Malignant neoplasm of parotid gland: Secondary | ICD-10-CM | POA: Diagnosis not present

## 2017-11-28 DIAGNOSIS — Z9049 Acquired absence of other specified parts of digestive tract: Secondary | ICD-10-CM | POA: Diagnosis not present

## 2017-11-28 DIAGNOSIS — Z9013 Acquired absence of bilateral breasts and nipples: Secondary | ICD-10-CM | POA: Diagnosis not present

## 2017-11-28 DIAGNOSIS — J439 Emphysema, unspecified: Secondary | ICD-10-CM | POA: Diagnosis not present

## 2017-11-28 DIAGNOSIS — G518 Other disorders of facial nerve: Secondary | ICD-10-CM | POA: Diagnosis not present

## 2017-11-28 DIAGNOSIS — Z85038 Personal history of other malignant neoplasm of large intestine: Secondary | ICD-10-CM | POA: Diagnosis not present

## 2017-11-28 DIAGNOSIS — I10 Essential (primary) hypertension: Secondary | ICD-10-CM | POA: Diagnosis not present

## 2017-11-28 DIAGNOSIS — C7989 Secondary malignant neoplasm of other specified sites: Secondary | ICD-10-CM | POA: Diagnosis not present

## 2017-11-28 DIAGNOSIS — Z87891 Personal history of nicotine dependence: Secondary | ICD-10-CM | POA: Diagnosis not present

## 2017-11-28 DIAGNOSIS — Z853 Personal history of malignant neoplasm of breast: Secondary | ICD-10-CM | POA: Diagnosis not present

## 2017-11-29 DIAGNOSIS — G518 Other disorders of facial nerve: Secondary | ICD-10-CM | POA: Diagnosis not present

## 2017-11-29 DIAGNOSIS — Z9981 Dependence on supplemental oxygen: Secondary | ICD-10-CM | POA: Diagnosis not present

## 2017-11-29 DIAGNOSIS — C7989 Secondary malignant neoplasm of other specified sites: Secondary | ICD-10-CM | POA: Diagnosis not present

## 2017-11-29 DIAGNOSIS — C07 Malignant neoplasm of parotid gland: Secondary | ICD-10-CM | POA: Diagnosis not present

## 2017-11-29 DIAGNOSIS — R64 Cachexia: Secondary | ICD-10-CM | POA: Diagnosis not present

## 2017-11-29 DIAGNOSIS — R1310 Dysphagia, unspecified: Secondary | ICD-10-CM | POA: Diagnosis not present

## 2017-11-30 DIAGNOSIS — C7989 Secondary malignant neoplasm of other specified sites: Secondary | ICD-10-CM | POA: Diagnosis not present

## 2017-11-30 DIAGNOSIS — R1310 Dysphagia, unspecified: Secondary | ICD-10-CM | POA: Diagnosis not present

## 2017-11-30 DIAGNOSIS — R64 Cachexia: Secondary | ICD-10-CM | POA: Diagnosis not present

## 2017-11-30 DIAGNOSIS — Z9981 Dependence on supplemental oxygen: Secondary | ICD-10-CM | POA: Diagnosis not present

## 2017-11-30 DIAGNOSIS — G518 Other disorders of facial nerve: Secondary | ICD-10-CM | POA: Diagnosis not present

## 2017-11-30 DIAGNOSIS — C07 Malignant neoplasm of parotid gland: Secondary | ICD-10-CM | POA: Diagnosis not present

## 2017-12-01 DIAGNOSIS — C07 Malignant neoplasm of parotid gland: Secondary | ICD-10-CM | POA: Diagnosis not present

## 2017-12-01 DIAGNOSIS — R64 Cachexia: Secondary | ICD-10-CM | POA: Diagnosis not present

## 2017-12-01 DIAGNOSIS — Z9981 Dependence on supplemental oxygen: Secondary | ICD-10-CM | POA: Diagnosis not present

## 2017-12-01 DIAGNOSIS — R1310 Dysphagia, unspecified: Secondary | ICD-10-CM | POA: Diagnosis not present

## 2017-12-01 DIAGNOSIS — C7989 Secondary malignant neoplasm of other specified sites: Secondary | ICD-10-CM | POA: Diagnosis not present

## 2017-12-01 DIAGNOSIS — G518 Other disorders of facial nerve: Secondary | ICD-10-CM | POA: Diagnosis not present

## 2017-12-02 DIAGNOSIS — R64 Cachexia: Secondary | ICD-10-CM | POA: Diagnosis not present

## 2017-12-02 DIAGNOSIS — R1310 Dysphagia, unspecified: Secondary | ICD-10-CM | POA: Diagnosis not present

## 2017-12-02 DIAGNOSIS — Z9981 Dependence on supplemental oxygen: Secondary | ICD-10-CM | POA: Diagnosis not present

## 2017-12-02 DIAGNOSIS — C07 Malignant neoplasm of parotid gland: Secondary | ICD-10-CM | POA: Diagnosis not present

## 2017-12-02 DIAGNOSIS — G518 Other disorders of facial nerve: Secondary | ICD-10-CM | POA: Diagnosis not present

## 2017-12-02 DIAGNOSIS — C7989 Secondary malignant neoplasm of other specified sites: Secondary | ICD-10-CM | POA: Diagnosis not present

## 2017-12-03 DIAGNOSIS — C07 Malignant neoplasm of parotid gland: Secondary | ICD-10-CM | POA: Diagnosis not present

## 2017-12-03 DIAGNOSIS — C7989 Secondary malignant neoplasm of other specified sites: Secondary | ICD-10-CM | POA: Diagnosis not present

## 2017-12-03 DIAGNOSIS — R64 Cachexia: Secondary | ICD-10-CM | POA: Diagnosis not present

## 2017-12-03 DIAGNOSIS — G518 Other disorders of facial nerve: Secondary | ICD-10-CM | POA: Diagnosis not present

## 2017-12-03 DIAGNOSIS — Z9981 Dependence on supplemental oxygen: Secondary | ICD-10-CM | POA: Diagnosis not present

## 2017-12-03 DIAGNOSIS — R1310 Dysphagia, unspecified: Secondary | ICD-10-CM | POA: Diagnosis not present

## 2017-12-04 DIAGNOSIS — G518 Other disorders of facial nerve: Secondary | ICD-10-CM | POA: Diagnosis not present

## 2017-12-04 DIAGNOSIS — C7989 Secondary malignant neoplasm of other specified sites: Secondary | ICD-10-CM | POA: Diagnosis not present

## 2017-12-04 DIAGNOSIS — R1310 Dysphagia, unspecified: Secondary | ICD-10-CM | POA: Diagnosis not present

## 2017-12-04 DIAGNOSIS — R64 Cachexia: Secondary | ICD-10-CM | POA: Diagnosis not present

## 2017-12-04 DIAGNOSIS — Z9981 Dependence on supplemental oxygen: Secondary | ICD-10-CM | POA: Diagnosis not present

## 2017-12-04 DIAGNOSIS — C07 Malignant neoplasm of parotid gland: Secondary | ICD-10-CM | POA: Diagnosis not present

## 2017-12-07 DIAGNOSIS — R1310 Dysphagia, unspecified: Secondary | ICD-10-CM | POA: Diagnosis not present

## 2017-12-07 DIAGNOSIS — C07 Malignant neoplasm of parotid gland: Secondary | ICD-10-CM | POA: Diagnosis not present

## 2017-12-07 DIAGNOSIS — G518 Other disorders of facial nerve: Secondary | ICD-10-CM | POA: Diagnosis not present

## 2017-12-07 DIAGNOSIS — R64 Cachexia: Secondary | ICD-10-CM | POA: Diagnosis not present

## 2017-12-07 DIAGNOSIS — Z9981 Dependence on supplemental oxygen: Secondary | ICD-10-CM | POA: Diagnosis not present

## 2017-12-07 DIAGNOSIS — C7989 Secondary malignant neoplasm of other specified sites: Secondary | ICD-10-CM | POA: Diagnosis not present

## 2017-12-08 DIAGNOSIS — Z9981 Dependence on supplemental oxygen: Secondary | ICD-10-CM | POA: Diagnosis not present

## 2017-12-08 DIAGNOSIS — R64 Cachexia: Secondary | ICD-10-CM | POA: Diagnosis not present

## 2017-12-08 DIAGNOSIS — C7989 Secondary malignant neoplasm of other specified sites: Secondary | ICD-10-CM | POA: Diagnosis not present

## 2017-12-08 DIAGNOSIS — R1310 Dysphagia, unspecified: Secondary | ICD-10-CM | POA: Diagnosis not present

## 2017-12-08 DIAGNOSIS — C07 Malignant neoplasm of parotid gland: Secondary | ICD-10-CM | POA: Diagnosis not present

## 2017-12-08 DIAGNOSIS — G518 Other disorders of facial nerve: Secondary | ICD-10-CM | POA: Diagnosis not present

## 2017-12-09 DIAGNOSIS — R1310 Dysphagia, unspecified: Secondary | ICD-10-CM | POA: Diagnosis not present

## 2017-12-09 DIAGNOSIS — C7989 Secondary malignant neoplasm of other specified sites: Secondary | ICD-10-CM | POA: Diagnosis not present

## 2017-12-09 DIAGNOSIS — G518 Other disorders of facial nerve: Secondary | ICD-10-CM | POA: Diagnosis not present

## 2017-12-09 DIAGNOSIS — R64 Cachexia: Secondary | ICD-10-CM | POA: Diagnosis not present

## 2017-12-09 DIAGNOSIS — C07 Malignant neoplasm of parotid gland: Secondary | ICD-10-CM | POA: Diagnosis not present

## 2017-12-09 DIAGNOSIS — Z9981 Dependence on supplemental oxygen: Secondary | ICD-10-CM | POA: Diagnosis not present

## 2017-12-10 DIAGNOSIS — R1310 Dysphagia, unspecified: Secondary | ICD-10-CM | POA: Diagnosis not present

## 2017-12-10 DIAGNOSIS — R64 Cachexia: Secondary | ICD-10-CM | POA: Diagnosis not present

## 2017-12-10 DIAGNOSIS — C07 Malignant neoplasm of parotid gland: Secondary | ICD-10-CM | POA: Diagnosis not present

## 2017-12-10 DIAGNOSIS — C7989 Secondary malignant neoplasm of other specified sites: Secondary | ICD-10-CM | POA: Diagnosis not present

## 2017-12-10 DIAGNOSIS — G518 Other disorders of facial nerve: Secondary | ICD-10-CM | POA: Diagnosis not present

## 2017-12-10 DIAGNOSIS — Z9981 Dependence on supplemental oxygen: Secondary | ICD-10-CM | POA: Diagnosis not present

## 2017-12-11 DIAGNOSIS — C07 Malignant neoplasm of parotid gland: Secondary | ICD-10-CM | POA: Diagnosis not present

## 2017-12-11 DIAGNOSIS — G518 Other disorders of facial nerve: Secondary | ICD-10-CM | POA: Diagnosis not present

## 2017-12-11 DIAGNOSIS — R1310 Dysphagia, unspecified: Secondary | ICD-10-CM | POA: Diagnosis not present

## 2017-12-11 DIAGNOSIS — C7989 Secondary malignant neoplasm of other specified sites: Secondary | ICD-10-CM | POA: Diagnosis not present

## 2017-12-11 DIAGNOSIS — R64 Cachexia: Secondary | ICD-10-CM | POA: Diagnosis not present

## 2017-12-11 DIAGNOSIS — Z9981 Dependence on supplemental oxygen: Secondary | ICD-10-CM | POA: Diagnosis not present

## 2017-12-12 DIAGNOSIS — R1310 Dysphagia, unspecified: Secondary | ICD-10-CM | POA: Diagnosis not present

## 2017-12-12 DIAGNOSIS — C7989 Secondary malignant neoplasm of other specified sites: Secondary | ICD-10-CM | POA: Diagnosis not present

## 2017-12-12 DIAGNOSIS — G518 Other disorders of facial nerve: Secondary | ICD-10-CM | POA: Diagnosis not present

## 2017-12-12 DIAGNOSIS — R64 Cachexia: Secondary | ICD-10-CM | POA: Diagnosis not present

## 2017-12-12 DIAGNOSIS — Z9981 Dependence on supplemental oxygen: Secondary | ICD-10-CM | POA: Diagnosis not present

## 2017-12-12 DIAGNOSIS — C07 Malignant neoplasm of parotid gland: Secondary | ICD-10-CM | POA: Diagnosis not present

## 2017-12-13 DIAGNOSIS — R1310 Dysphagia, unspecified: Secondary | ICD-10-CM | POA: Diagnosis not present

## 2017-12-13 DIAGNOSIS — Z9981 Dependence on supplemental oxygen: Secondary | ICD-10-CM | POA: Diagnosis not present

## 2017-12-13 DIAGNOSIS — G518 Other disorders of facial nerve: Secondary | ICD-10-CM | POA: Diagnosis not present

## 2017-12-13 DIAGNOSIS — R64 Cachexia: Secondary | ICD-10-CM | POA: Diagnosis not present

## 2017-12-13 DIAGNOSIS — C7989 Secondary malignant neoplasm of other specified sites: Secondary | ICD-10-CM | POA: Diagnosis not present

## 2017-12-13 DIAGNOSIS — C07 Malignant neoplasm of parotid gland: Secondary | ICD-10-CM | POA: Diagnosis not present

## 2017-12-14 DIAGNOSIS — C7989 Secondary malignant neoplasm of other specified sites: Secondary | ICD-10-CM | POA: Diagnosis not present

## 2017-12-14 DIAGNOSIS — R1310 Dysphagia, unspecified: Secondary | ICD-10-CM | POA: Diagnosis not present

## 2017-12-14 DIAGNOSIS — R64 Cachexia: Secondary | ICD-10-CM | POA: Diagnosis not present

## 2017-12-14 DIAGNOSIS — G518 Other disorders of facial nerve: Secondary | ICD-10-CM | POA: Diagnosis not present

## 2017-12-14 DIAGNOSIS — Z9981 Dependence on supplemental oxygen: Secondary | ICD-10-CM | POA: Diagnosis not present

## 2017-12-14 DIAGNOSIS — C07 Malignant neoplasm of parotid gland: Secondary | ICD-10-CM | POA: Diagnosis not present

## 2017-12-15 DIAGNOSIS — G518 Other disorders of facial nerve: Secondary | ICD-10-CM | POA: Diagnosis not present

## 2017-12-15 DIAGNOSIS — R64 Cachexia: Secondary | ICD-10-CM | POA: Diagnosis not present

## 2017-12-15 DIAGNOSIS — R1310 Dysphagia, unspecified: Secondary | ICD-10-CM | POA: Diagnosis not present

## 2017-12-15 DIAGNOSIS — C7989 Secondary malignant neoplasm of other specified sites: Secondary | ICD-10-CM | POA: Diagnosis not present

## 2017-12-15 DIAGNOSIS — Z9981 Dependence on supplemental oxygen: Secondary | ICD-10-CM | POA: Diagnosis not present

## 2017-12-15 DIAGNOSIS — C07 Malignant neoplasm of parotid gland: Secondary | ICD-10-CM | POA: Diagnosis not present

## 2017-12-21 ENCOUNTER — Encounter: Payer: Self-pay | Admitting: *Deleted

## 2017-12-21 NOTE — [facility]regational Nurse Program (Signed)
[id number] Laura [name],BSN,RN3,CCM,CN/Well wishes toi sent to the family in light of recent illness.

## 2017-12-28 DEATH — deceased

## 2018-03-08 IMAGING — CT CT TEMPORAL BONES W/O CM
1 of 4 series · 9 of 30 positions shown, 12 images · IV contrast (iopamidol)
Comparison: 03/09/2014 CT of the head.  12/09/2013 CT chest.

CLINICAL DATA: 81 y/o F; right-sided facial droop and pressure,
question Bell's palsy. Lip and tongue swelling. Dysphagia. Symptoms
for 2 years but worse in the last month. History of breast cancer
with double mastectomy, colon cancer, and post thyroid ablation.

EXAM:
CT NECK WITH CONTRAST
CT TEMPORAL BONES WITHOUT CONTRAST
TECHNIQUE: Multidetector CT imaging of the neck was performed using the
standard protocol following the bolus administration of intravenous
contrast.
CONTRAST:  80mL 8NR6FU-[id number] IOPAMIDOL (8NR6FU-[id number]) INJECTION 61%

[Series 6: rt mag axial · axial · 0.20mm/px · z∈[-386,-331]mm · 9 of 231 slices shown, 12 images]
[im 24/231  brain]
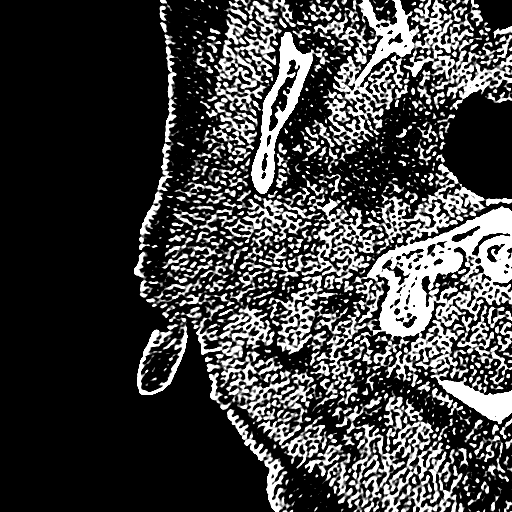
[im 24/231  bone]
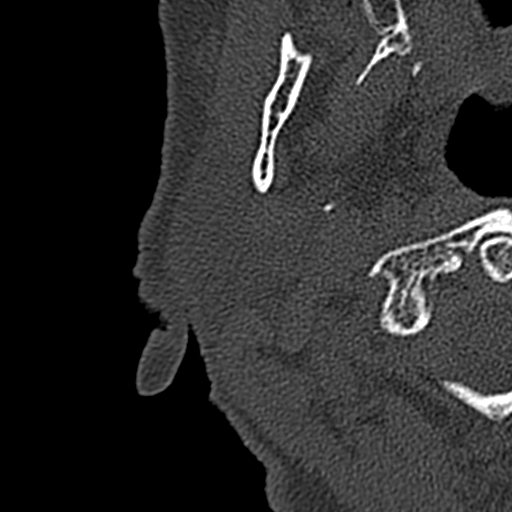
[im 47/231  bone]
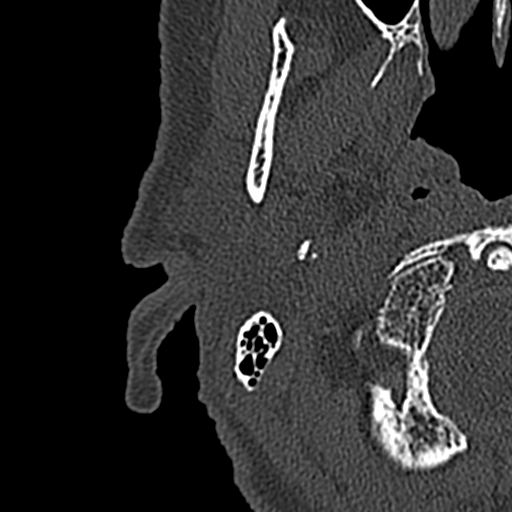
[im 70/231  bone]
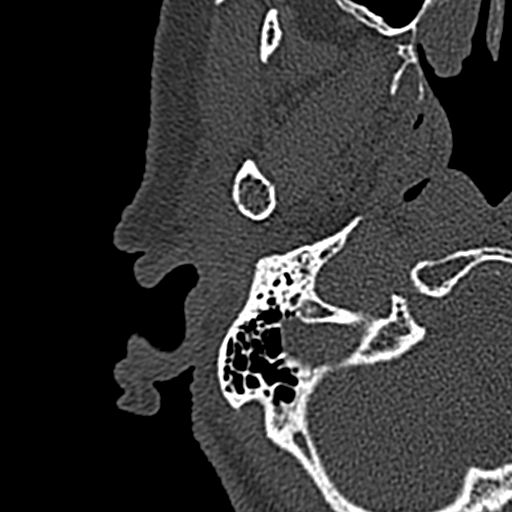
[im 93/231  bone]
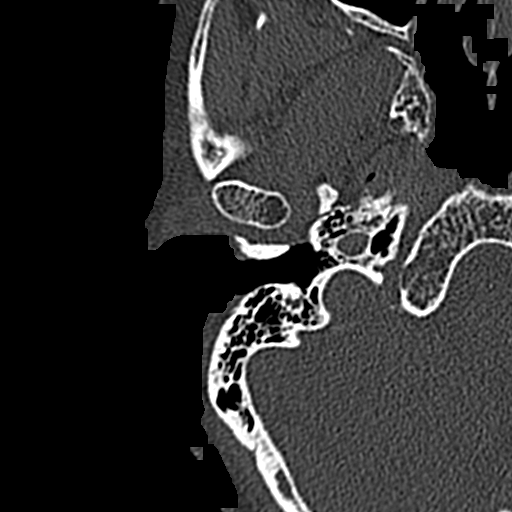
[im 116/231  brain]
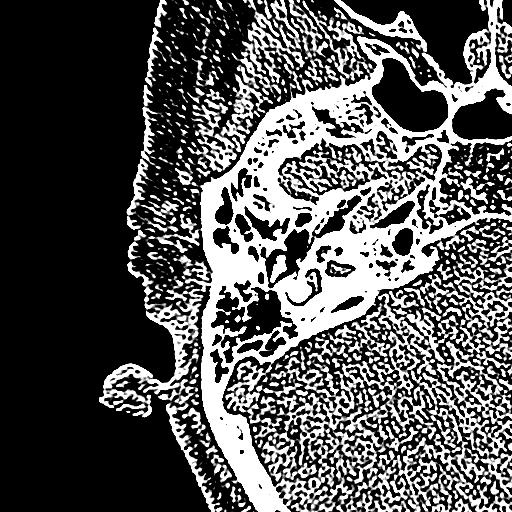
[im 116/231  bone]
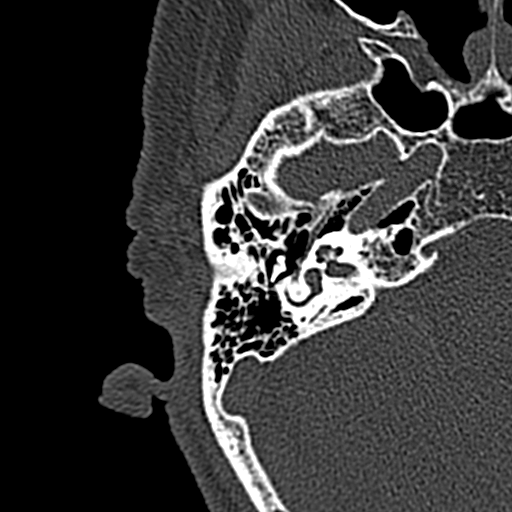
[im 139/231  bone]
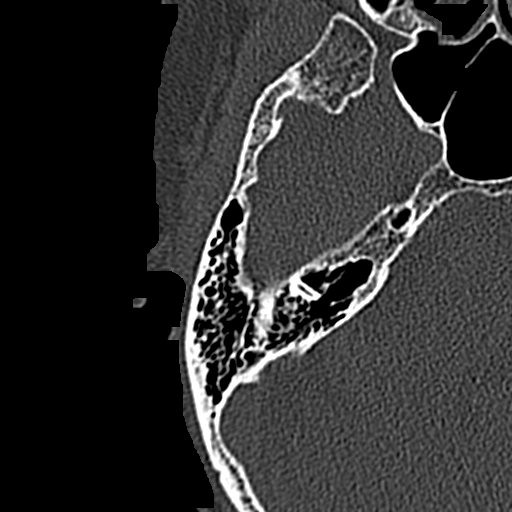
[im 162/231  bone]
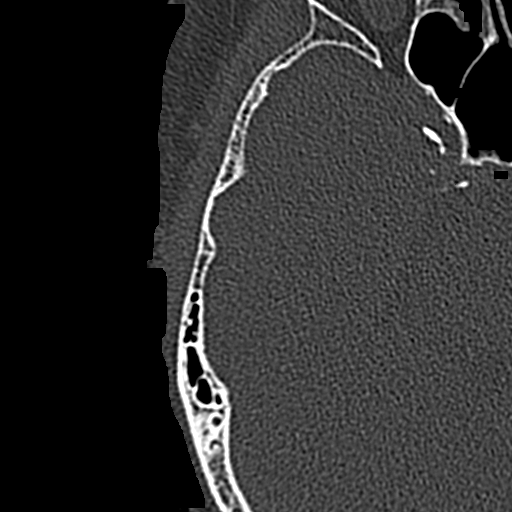
[im 185/231  bone]
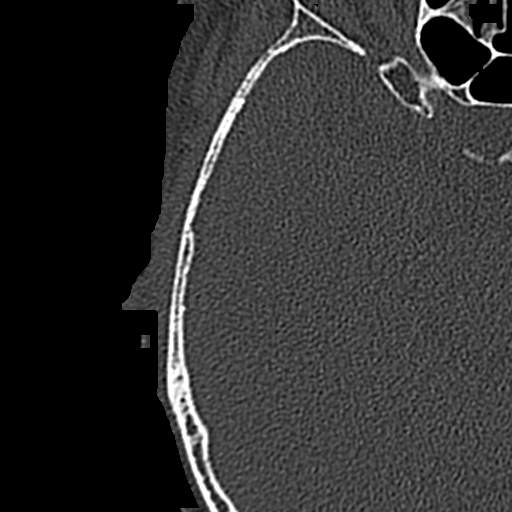
[im 208/231  brain]
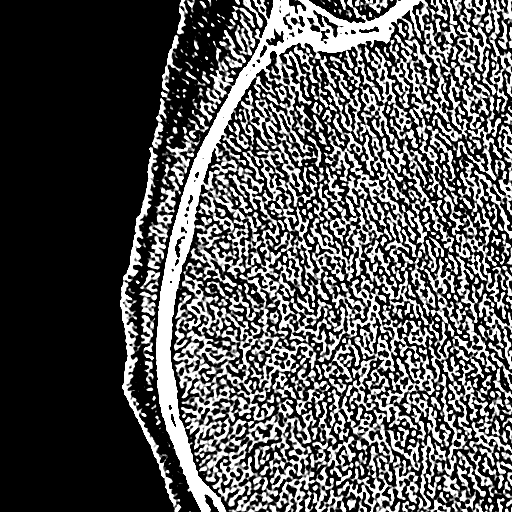
[im 208/231  bone]
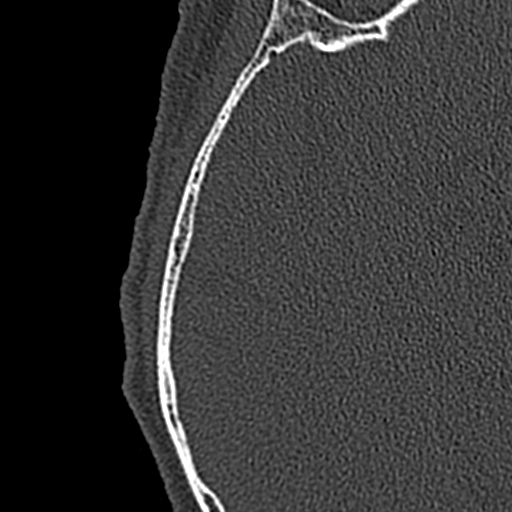

[9 of 30 positions shown; findings below may reference images not displayed]

FINDINGS: CT neck:

Pharynx and larynx: Up referable

Salivary glands: Centrally necrotic ill-defined mass within the
right parotid tail measuring 12 x 10 x 11 mm (series 3, image 34 and
series 7, image 47). Additionally, there is ill-defined enhancement
within the central parotid gland (series 7, image 46) which may
represent perineural spread of disease extending into the deep lobe
of parotid (series 7, image 44).

Thyroid: 15 mm calcified nodule in right lobe of thyroid.

Lymph nodes: There are lymph nodes demonstrating increased
enhancement at the level 3 and 5 a stations the largest in the level
3 station measuring 8 x 11 mm (series 3, image 36). See coronal
images 54, 57, 67.

Vascular: Severe calcific atherosclerosis of right proximal internal
carotid artery with probable moderate to severe underlying stenosis
(series 3 image 37), accurate assessment of luminal stenosis is
limited by calcification. Moderate calcified plaque of left carotid
bifurcation and proximal internal carotid artery with mild to
moderate underlying stenosis (series 3, image 37). Aortic
atherosclerosis with moderate arch calcification.

Limited intracranial: Unchanged left subinsular lucency compatible
with old lacunar infarct. Calcification of carotid siphons. No acute
intracranial abnormality within visualized portions of the brain.

Visualized orbits: Bilateral intra-ocular lens replacement.
Bilateral inferolateral subconjunctival orbital fat herniation
(series 5, image 47).

Mastoids and visualized paranasal sinuses: Status post partial
ethmoidectomy and bilateral maxillary antrostomy. Minimal mucosal
thickening of residual ethmoid air cells.

Skeleton: No acute or aggressive process.

Upper chest: Moderate centrilobular emphysema of the lung apices. 6
mm nodule in the right lung apex not present on prior CT of chest
(series 4, image 21).

Other: None.

CT temporal bone:

Right ear: Normal external auditory canal. Normal tympanic membrane.
No blunting of the scutum. The ossicles are intact without gross
erosion. Normal course of the facial nerve. Normal semicircular
canals and vestibulocochlear apparatus. Normally aerated mastoid air
cells and middle ear.

Left ear: Normal external auditory canal. Normal tympanic membrane.
No blunting of the scutum. The ossicles are intact without gross
erosion. Normal course of the facial nerve. Normal semicircular
canals and vestibulocochlear apparatus. Normally aerated mastoid air
cells and middle ear.
IMPRESSION: 1. Necrotic mass centered within the right parotid tail probably
representing a malignant salivary neoplasm, although metastatic
disease is possible given extensive history. Ill-defined enhancement
within central parotid and deep lobe may represent perineural spread
of disease.
2. Enhancing lymph nodes in the right level 3 and level 5a stations,
probably metastatic.
3. 15 mm calcified nodule in right lobe of thyroid. Further
evaluation with thyroid ultrasound is recommended.
4. New 6 mm nodule in the right lung apex. Non-contrast chest CT at
6-12 months is recommended. If the nodule is stable at time of
repeat CT, then future CT at 18-24 months (from today's scan) is
considered optional for low-risk patients, but is recommended for
high-risk patients. This recommendation follows the consensus
statement: Guidelines for Management of Incidental Pulmonary Nodules
Detected on CT Images: From the [HOSPITAL] 6064; Radiology
6064; [DATE].
5. Moderate centrilobular emphysema.
6. Dense calcific atherosclerosis of right proximal ICA with
probable underlying moderate to severe stenosis and calcification of
left proximal ICA with probable moderate stenosis. Accurate
assessment of luminal stenosis is limited due to extensive
calcification. Carotid ultrasound is recommended.
7. Unremarkable temporal bone CT. Normal course of cranial nerve 7
bilaterally.
These results will be called to the ordering clinician or
representative by the Radiologist Assistant, and communication
documented in the PACS or zVision Dashboard.

By: Jose Tripleta Pellicier M.D.
